# Patient Record
Sex: Female | Born: 1985 | Race: White | Hispanic: No | Marital: Married | Smoking: Never smoker
Health system: Southern US, Community
[De-identification: ages and names within clinical notes are randomized; demographics above are authoritative.]

## PROBLEM LIST (undated history)

## (undated) DIAGNOSIS — T7840XA Allergy, unspecified, initial encounter: Secondary | ICD-10-CM

## (undated) DIAGNOSIS — K625 Hemorrhage of anus and rectum: Secondary | ICD-10-CM

## (undated) DIAGNOSIS — F32A Depression, unspecified: Secondary | ICD-10-CM

## (undated) DIAGNOSIS — K529 Noninfective gastroenteritis and colitis, unspecified: Secondary | ICD-10-CM

## (undated) DIAGNOSIS — J45909 Unspecified asthma, uncomplicated: Secondary | ICD-10-CM

## (undated) DIAGNOSIS — K389 Disease of appendix, unspecified: Secondary | ICD-10-CM

## (undated) DIAGNOSIS — F419 Anxiety disorder, unspecified: Secondary | ICD-10-CM

## (undated) DIAGNOSIS — A0472 Enterocolitis due to Clostridium difficile, not specified as recurrent: Secondary | ICD-10-CM

## (undated) DIAGNOSIS — M545 Low back pain, unspecified: Secondary | ICD-10-CM

## (undated) DIAGNOSIS — M722 Plantar fascial fibromatosis: Secondary | ICD-10-CM

## (undated) HISTORY — DX: Low back pain, unspecified: M54.50

## (undated) HISTORY — DX: Depression, unspecified: F32.A

## (undated) HISTORY — DX: Enterocolitis due to Clostridium difficile, not specified as recurrent: A04.72

## (undated) HISTORY — PX: TUBAL LIGATION: SHX77

## (undated) HISTORY — DX: Disease of appendix, unspecified: K38.9

## (undated) HISTORY — DX: Low back pain: M54.5

## (undated) HISTORY — DX: Anxiety disorder, unspecified: F41.9

## (undated) HISTORY — DX: Noninfective gastroenteritis and colitis, unspecified: K52.9

## (undated) HISTORY — DX: Allergy, unspecified, initial encounter: T78.40XA

## (undated) HISTORY — PX: WISDOM TOOTH EXTRACTION: SHX21

## (undated) HISTORY — DX: Unspecified asthma, uncomplicated: J45.909

## (undated) HISTORY — PX: CHOLECYSTECTOMY: SHX55

## (undated) HISTORY — DX: Hemorrhage of anus and rectum: K62.5

---

## 2010-12-05 ENCOUNTER — Other Ambulatory Visit (HOSPITAL_COMMUNITY)
Admission: RE | Admit: 2010-12-05 | Discharge: 2010-12-05 | Disposition: A | Discharge status: Home / Self Care | Payer: BC Managed Care – PPO | Source: Ambulatory Visit | Attending: Family Medicine | Admitting: Family Medicine

## 2010-12-05 DIAGNOSIS — Z124 Encounter for screening for malignant neoplasm of cervix: Secondary | ICD-10-CM | POA: Insufficient documentation

## 2013-04-21 ENCOUNTER — Ambulatory Visit (INDEPENDENT_AMBULATORY_CARE_PROVIDER_SITE_OTHER): Payer: BC Managed Care – PPO

## 2013-04-21 VITALS — BP 150/76 | HR 82 | Resp 16 | Ht 64.0 in | Wt 230.0 lb

## 2013-04-21 DIAGNOSIS — M79609 Pain in unspecified limb: Secondary | ICD-10-CM

## 2013-04-21 DIAGNOSIS — M79673 Pain in unspecified foot: Secondary | ICD-10-CM

## 2013-04-21 DIAGNOSIS — M722 Plantar fascial fibromatosis: Secondary | ICD-10-CM

## 2013-04-21 NOTE — Progress Notes (Signed)
   Subjective:    Patient ID: Casey Pope, female    DOB: 06/22/1985, 28 y.o.   MRN: [id number]  HPI Comments: "My heel and arch hurt"  Pt c/o of plantar arch and heel pain left, sharp sensations for a few months. Getting worse. AM pain. Been wearing Dr. Margart SicklesScholl's insoles and wearing sneakers. PCP Rx'd etodolac. Using ice, stretching and elevating.  patient been taking it Titralac for her pain from her primary physician having pain left more so than right with the inferior heel and arch area. Patient wearing shoes are extremely hypermobile are flexible    Review of Systems  Cardiovascular:       Calf pain with walking   Musculoskeletal: Positive for back pain and gait problem.  All other systems reviewed and are negative.       Objective:   Physical Exam Partially objective findings as follows vascular status is intact with pedal pulses palpable DP postal for PT +2/4 bilateral Refill time 3 seconds all digits skin temperature warm turgor normal no edema rubor pallor or varicosities noted. Neurologically epicritic and proprioceptive sensations intact and symmetric bilateral is normal plantar response. Dermatologic exam skin color pigment normal hair growth absent. Nails unremarkable. Orthopedic biomechanical exam rectus foot type pain on palpation Magan plantar fascia from mid arch to inferior calcaneal tubercle left more significant than right. X-rays confirm well-developed inferior retrocalcaneal spurring promontory changes there is hypertrophy navicular lateral projection dorsal spurring of the navicular noted. However most significant thickening of fascial structures noted. No other osseous abnormalities no cyst or tumors no history of injury or trauma.       Assessment & Plan:  Assessment this time is plantar fasciitis/heel spur syndrome bilateral left more so than right this time fascial strapping applied to the left foot maintain the [place] anti-inflammatory NSAID therapies also use  crocs around the house no barefoot or flimsy shoes or flip-flops. Recommended ice to the heel every evening as well recheck in 2 weeks for followup reassessment possible followup with functional orthoses as recommended  Alvan Dameichard Eshani Maestre DPM

## 2013-04-21 NOTE — Patient Instructions (Signed)

## 2013-05-09 ENCOUNTER — Ambulatory Visit: Payer: BC Managed Care – PPO

## 2013-08-04 LAB — OB RESULTS CONSOLE RPR: RPR: NONREACTIVE

## 2013-08-04 LAB — OB RESULTS CONSOLE ANTIBODY SCREEN: Antibody Screen: NEGATIVE

## 2013-08-04 LAB — OB RESULTS CONSOLE RUBELLA ANTIBODY, IGM: Rubella: IMMUNE

## 2013-08-04 LAB — OB RESULTS CONSOLE HEPATITIS B SURFACE ANTIGEN: HEP B S AG: NEGATIVE

## 2013-08-04 LAB — OB RESULTS CONSOLE ABO/RH: RH Type: POSITIVE

## 2013-08-04 LAB — OB RESULTS CONSOLE HIV ANTIBODY (ROUTINE TESTING): HIV: NONREACTIVE

## 2013-08-04 LAB — OB RESULTS CONSOLE GC/CHLAMYDIA
CHLAMYDIA, DNA PROBE: NEGATIVE
Gonorrhea: NEGATIVE

## 2013-12-05 ENCOUNTER — Other Ambulatory Visit: Payer: Self-pay

## 2013-12-20 ENCOUNTER — Other Ambulatory Visit (HOSPITAL_COMMUNITY): Payer: Self-pay | Admitting: Obstetrics and Gynecology

## 2013-12-20 DIAGNOSIS — O368390 Maternal care for abnormalities of the fetal heart rate or rhythm, unspecified trimester, not applicable or unspecified: Secondary | ICD-10-CM

## 2013-12-27 ENCOUNTER — Encounter (HOSPITAL_COMMUNITY): Payer: Self-pay

## 2013-12-27 ENCOUNTER — Ambulatory Visit (HOSPITAL_COMMUNITY)
Admission: RE | Admit: 2013-12-27 | Discharge: 2013-12-27 | Disposition: A | Discharge status: Home / Self Care | Payer: BC Managed Care – PPO | Source: Ambulatory Visit | Attending: Obstetrics and Gynecology | Admitting: Obstetrics and Gynecology

## 2013-12-27 VITALS — BP 111/56 | HR 82 | Wt 245.0 lb

## 2013-12-27 DIAGNOSIS — Z363 Encounter for antenatal screening for malformations: Secondary | ICD-10-CM | POA: Insufficient documentation

## 2013-12-27 DIAGNOSIS — Z1389 Encounter for screening for other disorder: Secondary | ICD-10-CM | POA: Insufficient documentation

## 2013-12-27 DIAGNOSIS — O368390 Maternal care for abnormalities of the fetal heart rate or rhythm, unspecified trimester, not applicable or unspecified: Secondary | ICD-10-CM | POA: Diagnosis not present

## 2014-01-29 ENCOUNTER — Encounter (HOSPITAL_COMMUNITY): Payer: Self-pay

## 2014-02-26 ENCOUNTER — Encounter (HOSPITAL_COMMUNITY)
Admission: RE | Admit: 2014-02-26 | Discharge: 2014-02-26 | Disposition: A | Discharge status: Home / Self Care | Payer: BC Managed Care – PPO | Source: Ambulatory Visit | Attending: Obstetrics & Gynecology | Admitting: Obstetrics & Gynecology

## 2014-02-26 ENCOUNTER — Encounter (HOSPITAL_COMMUNITY): Payer: Self-pay

## 2014-02-26 LAB — CBC
HEMATOCRIT: 34.7 % — AB (ref 36.0–46.0)
HEMOGLOBIN: 11.5 g/dL — AB (ref 12.0–15.0)
MCH: 29.6 pg (ref 26.0–34.0)
MCHC: 33.1 g/dL (ref 30.0–36.0)
MCV: 89.4 fL (ref 78.0–100.0)
Platelets: 209 10*3/uL (ref 150–400)
RBC: 3.88 MIL/uL (ref 3.87–5.11)
RDW: 13.4 % (ref 11.5–15.5)
WBC: 9.2 10*3/uL (ref 4.0–10.5)

## 2014-02-26 NOTE — Patient Instructions (Addendum)
   Your procedure is scheduled on: Tuesday, Dec 1  [facility] at: 11:30 AM Pick up the phone at the desk and dial [phone] and inform us of your arrival.  Please call this number if you have any problems the morning of surgery: [phone]  Remember: Do not eat food after midnight: Tonight Do not drink clear liquids after: 9 AM Tuesday, day of surgery Take these medicines the morning of surgery with a SIP OF WATER:  Bring albuterol inhaler with you on day of surgery.  Do not wear jewelry, make-up, or FINGER nail polish No metal in your hair or on your body. Do not wear lotions, powders, perfumes.  You may wear deodorant.  Do not bring valuables to the hospital. Contacts, dentures or bridgework may not be worn into surgery.  Leave suitcase in the car. After Surgery it may be brought to your room. For patients being admitted to the hospital, checkout time is 11:00am the day of discharge.  Home with husband Casey Pope.

## 2014-02-26 NOTE — H&P (Signed)
Casey HolsteinHilary S Pope is a 28 y.o. female presenting for repeat C/S and BTL.  Antepartum course has been complicated by previous C/S x 1.  Additionally, fetal PACs were noted in the office and MFM referral was placed.  No additional abnormalities were found; no structural disease.    Maternal Medical History:  Fetal activity: Perceived fetal activity is normal.   Last perceived fetal movement was within the past hour.    Prenatal complications: no prenatal complications Prenatal Complications - Diabetes: none.    OB History    Gravida Para Term Preterm AB TAB SAB Ectopic Multiple Living   2 1 1  0 0 0 0 0 0 2     Past Medical History  Diagnosis Date  . Lower back pain     ? herniated disc  . Asthma     as a child - rarely uses inhaler   Past Surgical History  Procedure Laterality Date  . Cesarean section    . Cholecystectomy    . Wisdom tooth extraction     Family History: family history is not on file. Social History:  reports that she has never smoked. She has never used smokeless tobacco. She reports that she drinks alcohol. She reports that she does not use illicit drugs.   Prenatal Transfer Tool  Maternal Diabetes: No Genetic Screening: Normal Maternal Ultrasounds/Referrals: Normal Fetal Ultrasounds or other Referrals:  Referred to Materal Fetal Medicine , Other: Referral for fetal PACs Maternal Substance Abuse:  No Significant Maternal Medications:  None Significant Maternal Lab Results:  Lab values include: Group B Strep negative Other Comments:  None  ROS    There were no vitals taken for this visit. Maternal Exam:  Abdomen: Surgical scars: low transverse.   Fundal height is c/w dates.   Estimated fetal weight is 8#.       Physical Exam  Constitutional: She is oriented to person, place, and time. She appears well-developed and well-nourished.  GI: Soft. There is no rebound and no guarding.  Neurological: She is alert and oriented to person, place, and time.   Skin: Skin is warm and dry.  Psychiatric: She has a normal mood and affect. Her behavior is normal.    Prenatal labs: ABO, Rh: --/--/O POS (11/30 1100) Antibody: NEG (11/30 1100) Rubella: Immune (05/08 0000) RPR: Nonreactive (05/08 0000)  HBsAg: Negative (05/08 0000)  HIV: Non-reactive (05/08 0000)  GBS:     Assessment/Plan: 28 yo G2 P1 at 39 weeks with previous C/S and DFPS -Repeat C/S and BTL  Corean Yoshimura 02/26/2014, 9:16 PM

## 2014-02-27 ENCOUNTER — Inpatient Hospital Stay (HOSPITAL_COMMUNITY)
Admission: RE | Admit: 2014-02-27 | Discharge: 2014-03-01 | DRG: 766 | Disposition: A | Discharge status: Home / Self Care | Payer: BC Managed Care – PPO | Source: Ambulatory Visit | Attending: Obstetrics & Gynecology | Admitting: Obstetrics & Gynecology

## 2014-02-27 ENCOUNTER — Encounter (HOSPITAL_COMMUNITY)
Admission: RE | Disposition: A | Discharge status: Home / Self Care | Payer: Self-pay | Source: Ambulatory Visit | Attending: Obstetrics & Gynecology

## 2014-02-27 ENCOUNTER — Inpatient Hospital Stay (HOSPITAL_COMMUNITY): Payer: BC Managed Care – PPO | Admitting: Anesthesiology

## 2014-02-27 ENCOUNTER — Encounter (HOSPITAL_COMMUNITY): Payer: Self-pay | Admitting: Anesthesiology

## 2014-02-27 DIAGNOSIS — O3421 Maternal care for scar from previous cesarean delivery: Principal | ICD-10-CM | POA: Diagnosis present

## 2014-02-27 DIAGNOSIS — Z98891 History of uterine scar from previous surgery: Secondary | ICD-10-CM

## 2014-02-27 DIAGNOSIS — Z3A39 39 weeks gestation of pregnancy: Secondary | ICD-10-CM | POA: Diagnosis present

## 2014-02-27 DIAGNOSIS — Z302 Encounter for sterilization: Secondary | ICD-10-CM

## 2014-02-27 LAB — ABO/RH: ABO/RH(D): O POS

## 2014-02-27 LAB — PREPARE RBC (CROSSMATCH)

## 2014-02-27 SURGERY — Surgical Case
Anesthesia: Spinal | Site: Abdomen | Laterality: Bilateral

## 2014-02-27 MED ORDER — NALBUPHINE HCL 10 MG/ML IJ SOLN: 5.0000 mg | Freq: Once | INTRAMUSCULAR | Status: AC | PRN

## 2014-02-27 MED ORDER — SCOPOLAMINE 1 MG/3DAYS TD PT72: 1.0000 | MEDICATED_PATCH | Freq: Once | TRANSDERMAL | Status: DC

## 2014-02-27 MED ORDER — NALOXONE HCL 1 MG/ML IJ SOLN: 1.0000 ug/kg/h | INTRAVENOUS | Status: DC | PRN

## 2014-02-27 MED ORDER — SCOPOLAMINE 1 MG/3DAYS TD PT72
MEDICATED_PATCH | TRANSDERMAL | Status: AC
  Administered 2014-02-27: 1.5 mg via TRANSDERMAL
  Filled 2014-02-27: qty 1

## 2014-02-27 MED ORDER — DEXAMETHASONE SODIUM PHOSPHATE 10 MG/ML IJ SOLN
INTRAMUSCULAR | Status: AC
  Filled 2014-02-27: qty 1

## 2014-02-27 MED ORDER — SODIUM CHLORIDE 0.9 % IJ SOLN: 3.0000 mL | INTRAMUSCULAR | Status: DC | PRN

## 2014-02-27 MED ORDER — ONDANSETRON HCL 4 MG/2ML IJ SOLN: 4.0000 mg | INTRAMUSCULAR | Status: DC | PRN

## 2014-02-27 MED ORDER — SIMETHICONE 80 MG PO CHEW
80.0000 mg | CHEWABLE_TABLET | ORAL | Status: DC
  Administered 2014-02-28 – 2014-03-01 (×2): 80 mg via ORAL
  Filled 2014-02-27: qty 1

## 2014-02-27 MED ORDER — PHENYLEPHRINE 8 MG IN D5W 100 ML (0.08MG/ML) PREMIX OPTIME
INJECTION | INTRAVENOUS | Status: AC
Start: 2014-02-27 — End: 2014-02-27
  Filled 2014-02-27: qty 100

## 2014-02-27 MED ORDER — OXYTOCIN 10 UNIT/ML IJ SOLN
INTRAMUSCULAR | Status: AC
  Filled 2014-02-27: qty 4

## 2014-02-27 MED ORDER — ZOLPIDEM TARTRATE 5 MG PO TABS: 5.0000 mg | ORAL_TABLET | Freq: Every evening | ORAL | Status: DC | PRN

## 2014-02-27 MED ORDER — SCOPOLAMINE 1 MG/3DAYS TD PT72
1.0000 | MEDICATED_PATCH | Freq: Once | TRANSDERMAL | Status: DC
  Administered 2014-02-27: 1.5 mg via TRANSDERMAL

## 2014-02-27 MED ORDER — NALBUPHINE HCL 10 MG/ML IJ SOLN: 5.0000 mg | INTRAMUSCULAR | Status: DC | PRN

## 2014-02-27 MED ORDER — SENNOSIDES-DOCUSATE SODIUM 8.6-50 MG PO TABS
2.0000 | ORAL_TABLET | ORAL | Status: DC
  Administered 2014-02-28 – 2014-03-01 (×2): 2 via ORAL
  Filled 2014-02-27: qty 2
  Filled 2014-02-27: qty 1

## 2014-02-27 MED ORDER — LACTATED RINGERS IV SOLN
INTRAVENOUS | Status: DC | PRN
  Administered 2014-02-27 (×3): via INTRAVENOUS

## 2014-02-27 MED ORDER — FENTANYL CITRATE 0.05 MG/ML IJ SOLN
INTRAMUSCULAR | Status: AC
  Filled 2014-02-27: qty 2

## 2014-02-27 MED ORDER — DIBUCAINE 1 % RE OINT: 1.0000 "application " | TOPICAL_OINTMENT | RECTAL | Status: DC | PRN

## 2014-02-27 MED ORDER — LACTATED RINGERS IV SOLN
INTRAVENOUS | Status: DC
  Administered 2014-02-27: 12:00:00 via INTRAVENOUS

## 2014-02-27 MED ORDER — IBUPROFEN 600 MG PO TABS
600.0000 mg | ORAL_TABLET | Freq: Four times a day (QID) | ORAL | Status: DC
  Administered 2014-02-27 – 2014-03-01 (×7): 600 mg via ORAL
  Filled 2014-02-27 (×6): qty 1

## 2014-02-27 MED ORDER — CEFAZOLIN SODIUM-DEXTROSE 2-3 GM-% IV SOLR
2.0000 g | INTRAVENOUS | Status: AC
  Administered 2014-02-27: 2 g via INTRAVENOUS

## 2014-02-27 MED ORDER — MORPHINE SULFATE (PF) 0.5 MG/ML IJ SOLN
INTRAMUSCULAR | Status: DC | PRN
  Administered 2014-02-27: .1 mg via INTRATHECAL

## 2014-02-27 MED ORDER — MENTHOL 3 MG MT LOZG: 1.0000 | LOZENGE | OROMUCOSAL | Status: DC | PRN

## 2014-02-27 MED ORDER — SIMETHICONE 80 MG PO CHEW
80.0000 mg | CHEWABLE_TABLET | Freq: Three times a day (TID) | ORAL | Status: DC
  Administered 2014-02-28 – 2014-03-01 (×3): 80 mg via ORAL
  Filled 2014-02-27 (×3): qty 1

## 2014-02-27 MED ORDER — KETOROLAC TROMETHAMINE 30 MG/ML IJ SOLN
INTRAMUSCULAR | Status: AC
  Filled 2014-02-27: qty 1

## 2014-02-27 MED ORDER — OXYCODONE-ACETAMINOPHEN 5-325 MG PO TABS
1.0000 | ORAL_TABLET | ORAL | Status: DC | PRN
  Administered 2014-02-28 – 2014-03-01 (×7): 1 via ORAL
  Filled 2014-02-27 (×7): qty 1

## 2014-02-27 MED ORDER — WITCH HAZEL-GLYCERIN EX PADS: 1.0000 "application " | MEDICATED_PAD | CUTANEOUS | Status: DC | PRN

## 2014-02-27 MED ORDER — OXYTOCIN 40 UNITS IN LACTATED RINGERS INFUSION - SIMPLE MED: 62.5000 mL/h | INTRAVENOUS | Status: AC

## 2014-02-27 MED ORDER — DIPHENHYDRAMINE HCL 25 MG PO CAPS: 25.0000 mg | ORAL_CAPSULE | ORAL | Status: DC | PRN

## 2014-02-27 MED ORDER — TETANUS-DIPHTH-ACELL PERTUSSIS 5-2.5-18.5 LF-MCG/0.5 IM SUSP: 0.5000 mL | Freq: Once | INTRAMUSCULAR | Status: DC

## 2014-02-27 MED ORDER — DEXTROSE IN LACTATED RINGERS 5 % IV SOLN: INTRAVENOUS | Status: DC

## 2014-02-27 MED ORDER — ONDANSETRON HCL 4 MG/2ML IJ SOLN
INTRAMUSCULAR | Status: DC | PRN
  Administered 2014-02-27: 4 mg via INTRAVENOUS

## 2014-02-27 MED ORDER — HYDROMORPHONE HCL 1 MG/ML IJ SOLN: 0.2500 mg | INTRAMUSCULAR | Status: DC | PRN

## 2014-02-27 MED ORDER — PHENYLEPHRINE HCL 10 MG/ML IJ SOLN
INTRAMUSCULAR | Status: DC | PRN
  Administered 2014-02-27: 80 ug via INTRAVENOUS

## 2014-02-27 MED ORDER — ONDANSETRON HCL 4 MG PO TABS: 4.0000 mg | ORAL_TABLET | ORAL | Status: DC | PRN

## 2014-02-27 MED ORDER — METOCLOPRAMIDE HCL 5 MG/ML IJ SOLN
INTRAMUSCULAR | Status: AC
  Filled 2014-02-27: qty 2

## 2014-02-27 MED ORDER — PRENATAL MULTIVITAMIN CH
1.0000 | ORAL_TABLET | Freq: Every day | ORAL | Status: DC
  Administered 2014-02-28 – 2014-03-01 (×2): 1 via ORAL
  Filled 2014-02-27 (×2): qty 1

## 2014-02-27 MED ORDER — ONDANSETRON HCL 4 MG/2ML IJ SOLN
INTRAMUSCULAR | Status: AC
  Filled 2014-02-27: qty 2

## 2014-02-27 MED ORDER — DIPHENHYDRAMINE HCL 25 MG PO CAPS: 25.0000 mg | ORAL_CAPSULE | Freq: Four times a day (QID) | ORAL | Status: DC | PRN

## 2014-02-27 MED ORDER — MORPHINE SULFATE 0.5 MG/ML IJ SOLN
INTRAMUSCULAR | Status: AC
  Filled 2014-02-27: qty 10

## 2014-02-27 MED ORDER — MEPERIDINE HCL 25 MG/ML IJ SOLN: 6.2500 mg | INTRAMUSCULAR | Status: DC | PRN

## 2014-02-27 MED ORDER — OXYTOCIN 10 UNIT/ML IJ SOLN
40.0000 [IU] | INTRAVENOUS | Status: DC | PRN
  Administered 2014-02-27: 40 [IU] via INTRAVENOUS

## 2014-02-27 MED ORDER — CEFAZOLIN SODIUM-DEXTROSE 2-3 GM-% IV SOLR
INTRAVENOUS | Status: AC
  Filled 2014-02-27: qty 50

## 2014-02-27 MED ORDER — KETOROLAC TROMETHAMINE 30 MG/ML IJ SOLN
30.0000 mg | Freq: Four times a day (QID) | INTRAMUSCULAR | Status: DC | PRN
  Administered 2014-02-27: 30 mg via INTRAMUSCULAR

## 2014-02-27 MED ORDER — 0.9 % SODIUM CHLORIDE (POUR BTL) OPTIME
TOPICAL | Status: DC | PRN
  Administered 2014-02-27: 1000 mL

## 2014-02-27 MED ORDER — ONDANSETRON HCL 4 MG/2ML IJ SOLN: 4.0000 mg | Freq: Three times a day (TID) | INTRAMUSCULAR | Status: DC | PRN

## 2014-02-27 MED ORDER — PHENYLEPHRINE 8 MG IN D5W 100 ML (0.08MG/ML) PREMIX OPTIME
INJECTION | INTRAVENOUS | Status: DC | PRN
  Administered 2014-02-27: 80 ug/min via INTRAVENOUS

## 2014-02-27 MED ORDER — OXYCODONE-ACETAMINOPHEN 5-325 MG PO TABS: 2.0000 | ORAL_TABLET | ORAL | Status: DC | PRN

## 2014-02-27 MED ORDER — KETOROLAC TROMETHAMINE 30 MG/ML IJ SOLN: 30.0000 mg | Freq: Four times a day (QID) | INTRAMUSCULAR | Status: DC | PRN

## 2014-02-27 MED ORDER — SIMETHICONE 80 MG PO CHEW: 80.0000 mg | CHEWABLE_TABLET | ORAL | Status: DC | PRN

## 2014-02-27 MED ORDER — LACTATED RINGERS IV SOLN
INTRAVENOUS | Status: DC
  Administered 2014-02-28: 02:00:00 via INTRAVENOUS

## 2014-02-27 MED ORDER — METOCLOPRAMIDE HCL 5 MG/ML IJ SOLN
INTRAMUSCULAR | Status: DC | PRN
  Administered 2014-02-27: 10 mg via INTRAVENOUS

## 2014-02-27 MED ORDER — DIPHENHYDRAMINE HCL 50 MG/ML IJ SOLN: 12.5000 mg | INTRAMUSCULAR | Status: DC | PRN

## 2014-02-27 MED ORDER — LANOLIN HYDROUS EX OINT: 1.0000 "application " | TOPICAL_OINTMENT | CUTANEOUS | Status: DC | PRN

## 2014-02-27 MED ORDER — FENTANYL CITRATE 0.05 MG/ML IJ SOLN
INTRAMUSCULAR | Status: DC | PRN
  Administered 2014-02-27: 25 ug via INTRATHECAL

## 2014-02-27 MED ORDER — NALOXONE HCL 0.4 MG/ML IJ SOLN: 0.4000 mg | INTRAMUSCULAR | Status: DC | PRN

## 2014-02-27 SURGICAL SUPPLY — 33 items
BENZOIN TINCTURE PRP APPL 2/3 (GAUZE/BANDAGES/DRESSINGS) ×3 IMPLANT
CLAMP CORD UMBIL (MISCELLANEOUS) IMPLANT
CLOSURE WOUND 1/2 X4 (GAUZE/BANDAGES/DRESSINGS) ×1
CLOTH BEACON ORANGE TIMEOUT ST (SAFETY) ×3 IMPLANT
DRAPE SHEET LG 3/4 BI-LAMINATE (DRAPES) IMPLANT
DRSG OPSITE POSTOP 4X10 (GAUZE/BANDAGES/DRESSINGS) ×3 IMPLANT
DURAPREP 26ML APPLICATOR (WOUND CARE) ×3 IMPLANT
ELECT REM PT RETURN 9FT ADLT (ELECTROSURGICAL) ×3
ELECTRODE REM PT RTRN 9FT ADLT (ELECTROSURGICAL) ×1 IMPLANT
EXTRACTOR VACUUM KIWI (MISCELLANEOUS) ×3 IMPLANT
EXTRACTOR VACUUM M CUP 4 TUBE (SUCTIONS) IMPLANT
EXTRACTOR VACUUM M CUP 4' TUBE (SUCTIONS)
GLOVE BIO SURGEON STRL SZ 6 (GLOVE) ×3 IMPLANT
GLOVE BIOGEL PI IND STRL 6 (GLOVE) ×2 IMPLANT
GLOVE BIOGEL PI INDICATOR 6 (GLOVE) ×4
GOWN STRL REUS W/TWL LRG LVL3 (GOWN DISPOSABLE) ×6 IMPLANT
KIT ABG SYR 3ML LUER SLIP (SYRINGE) ×3 IMPLANT
LIQUID BAND (GAUZE/BANDAGES/DRESSINGS) IMPLANT
NEEDLE HYPO 25X5/8 SAFETYGLIDE (NEEDLE) ×3 IMPLANT
NS IRRIG 1000ML POUR BTL (IV SOLUTION) ×3 IMPLANT
PACK C SECTION WH (CUSTOM PROCEDURE TRAY) ×3 IMPLANT
PAD OB MATERNITY 4.3X12.25 (PERSONAL CARE ITEMS) ×3 IMPLANT
STAPLER VISISTAT 35W (STAPLE) IMPLANT
STRIP CLOSURE SKIN 1/2X4 (GAUZE/BANDAGES/DRESSINGS) ×2 IMPLANT
SUT CHROMIC 0 CTX 36 (SUTURE) ×9 IMPLANT
SUT MON AB 2-0 CT1 27 (SUTURE) ×3 IMPLANT
SUT PDS AB 0 CT1 27 (SUTURE) IMPLANT
SUT PLAIN 0 NONE (SUTURE) IMPLANT
SUT VIC AB 0 CT1 36 (SUTURE) IMPLANT
SUT VIC AB 4-0 KS 27 (SUTURE) ×3 IMPLANT
TOWEL OR 17X24 6PK STRL BLUE (TOWEL DISPOSABLE) ×3 IMPLANT
TRAY FOLEY CATH 14FR (SET/KITS/TRAYS/PACK) ×3 IMPLANT
WATER STERILE IRR 1000ML POUR (IV SOLUTION) ×3 IMPLANT

## 2014-02-27 NOTE — Plan of Care (Signed)
Problem: Phase I Progression Outcomes Goal: Pain controlled with appropriate interventions Outcome: Completed/Met Date Met:  02/27/14 Goal: Foley catheter patent Outcome: Completed/Met Date Met:  02/27/14

## 2014-02-27 NOTE — Anesthesia Postprocedure Evaluation (Signed)
  Anesthesia Post-op Note  Patient: Casey HolsteinHilary S Pope  Procedure(s) Performed: Procedure(s) with comments: CESAREAN SECTION WITH BILATERAL TUBAL LIGATION (Bilateral) - Repeat  edc 03/06/14  Patient is awake, responsive, moving her legs, and has signs of resolution of her numbness. Pain and nausea are reasonably well controlled. Vital signs are stable and clinically acceptable. Oxygen saturation is clinically acceptable. There are no apparent anesthetic complications at this time. Patient is ready for discharge.

## 2014-02-27 NOTE — Progress Notes (Signed)
Dr Cristela BlueKyle Jackson called.  He stated that pt may be discharge from PACU.

## 2014-02-27 NOTE — Transfer of Care (Signed)
Immediate Anesthesia Transfer of Care Note  Patient: Casey Pope  Procedure(s) Performed: Procedure(s) with comments: CESAREAN SECTION WITH BILATERAL TUBAL LIGATION (Bilateral) - Repeat  edc 03/06/14  Patient Location: PACU  Anesthesia Type:Spinal  Level of Consciousness: awake, alert , oriented and patient cooperative  Airway & Oxygen Therapy: Patient Spontanous Breathing  Post-op Assessment: Report given to PACU RN and Post -op Vital signs reviewed and stable  Post vital signs: Reviewed and stable  Complications: No apparent anesthesia complications

## 2014-02-27 NOTE — Op Note (Signed)
Chester HolsteinHilary S Johnson PROCEDURE DATE: 02/27/2014  PREOPERATIVE DIAGNOSIS: Intrauterine pregnancy at  5872w0d weeks gestation with previous C/S x 1 and desire for permanent sterilization.  POSTOPERATIVE DIAGNOSIS: The same  PROCEDURE:   Repeat Low Transverse Cesarean Section with BTL  SURGEON:  Dr. Mitchel HonourMegan Estie Sproule  INDICATIONS: Chester HolsteinHilary S Johnson is a 28 y.o. [id number] at 5672w0d scheduled for cesarean section secondary to desire for repeat.  The risks of cesarean section discussed with the patient included but were not limited to: bleeding which may require transfusion or reoperation; infection which may require antibiotics; injury to bowel, bladder, ureters or other surrounding organs; injury to the fetus; need for additional procedures including hysterectomy in the event of a life-threatening hemorrhage; placental abnormalities wth subsequent pregnancies, incisional problems, thromboembolic phenomenon and other postoperative/anesthesia complications. Regarding BTL, the patient is counseled about risk of permanence, failure, and ectopic. The patient concurred with the proposed plan, giving informed written consent for the procedure.    FINDINGS:  Viable female infant in cephalic presentation, APGARs 9,9:  Weight pending  Clear amniotic fluid.  Intact placenta, three vessel cord.  Grossly normal uterus, ovaries and fallopian tubes. .   ANESTHESIA:  Spinal ESTIMATED BLOOD LOSS: 550 ml SPECIMENS: Placenta sent to L&D, Bilateral tubal segments to pathology COMPLICATIONS: None immediate  PROCEDURE IN DETAIL:  The patient received intravenous antibiotics and had sequential compression devices applied to her lower extremities while in the preoperative area.  She was then taken to the operating room where spinal anesthesia was administered and was found to be adequate. She was then placed in a dorsal supine position with a leftward tilt, and prepped and draped in a sterile manner.  A foley catheter was placed into her  bladder and attached to constant gravity.  After an adequate timeout was performed, a Pfannenstiel skin incision was made with scalpel and carried through to the underlying layer of fascia. The fascia was incised in the midline and this incision was extended bilaterally using the Mayo scissors. Kocher clamps were applied to the superior aspect of the fascial incision and the underlying rectus muscles were dissected off bluntly. A similar process was carried out on the inferior aspect of the facial incision. The rectus muscles were separated in the midline bluntly and the peritoneum was entered bluntly.  Bladder flap was created sharply and developed bluntly.  Bladder blade was placed.  A transverse hysterotomy was made with a scalpel and extended bilaterally bluntly. The bladder blade was then removed. The infant was successfully delivered using a single pull with Kiwi vaccuum, and cord was clamped and cut and infant was handed over to awaiting neonatology team. Uterine massage was then administered and the placenta delivered intact with three-vessel cord. The uterus was cleared of clot and debris.  The hysterotomy was closed with 0 Chromic in a single layer.  A figure of eight stitch was placed in the middle of the hysterotomy to render it hemostatic.  Attention was turned to the fallopian tubes.  The right tube was elevated, doubly tied with plain gut and knuckle excised with excellent hemostasis.  The left tube was elevated and since mobility was restricted, a window was made in the mesosalpinx, proximal and distal tubal segments were tied with plain gut, and tubal segment was excised.  Hemostasis was noted.  The peritoneum and rectus muscles were noted to be hemostatic and were reapproximated using 2-0 monocryl.  The fascia was closed with 0-PDS in a running fashion with good restoration of anatomy.  The subcutaneus tissue was copiously irrigated and was reapproximated using plain gut suture in a running  fashion.  The skin was closed with 4-0 Vicryl in a subcuticular fashion.  Pt tolerated the procedure will.  All counts were correct x2.  Pt went to the recovery room in stable condition.

## 2014-02-27 NOTE — Anesthesia Preprocedure Evaluation (Signed)
Anesthesia Evaluation  Patient identified by MRN, date of birth, ID band Patient awake    Reviewed: Allergy & Precautions, H&P , Patient's Chart, lab work & pertinent test results  Airway Mallampati: II  TM Distance: >3 FB Neck ROM: full    Dental no notable dental hx.    Pulmonary asthma ,  breath sounds clear to auscultation  Pulmonary exam normal       Cardiovascular Exercise Tolerance: Good Rhythm:regular Rate:Normal     Neuro/Psych    GI/Hepatic   Endo/Other    Renal/GU      Musculoskeletal   Abdominal   Peds  Hematology   Anesthesia Other Findings   Reproductive/Obstetrics                             Anesthesia Physical Anesthesia Plan  ASA: III  Anesthesia Plan: Spinal   Post-op Pain Management:    Induction:   Airway Management Planned:   Additional Equipment:   Intra-op Plan:   Post-operative Plan:   Informed Consent: I have reviewed the patients History and Physical, chart, labs and discussed the procedure including the risks, benefits and alternatives for the proposed anesthesia with the patient or authorized representative who has indicated his/her understanding and acceptance.   Dental Advisory Given  Plan Discussed with: CRNA  Anesthesia Plan Comments: (Lab work confirmed with CRNA in room. Platelets okay. Discussed spinal anesthetic, and patient consents to the procedure:  included risk of possible headache,backache, failed block, allergic reaction, and nerve injury. This patient was asked if she had any questions or concerns before the procedure started. )        Anesthesia Quick Evaluation

## 2014-02-27 NOTE — Progress Notes (Signed)
No change to H&P.  Casey Lenoir, DO 

## 2014-02-27 NOTE — Plan of Care (Signed)
Problem: Phase I Progression Outcomes Goal: IS, TCDB as ordered Outcome: Completed/Met Date Met:  02/27/14 Goal: Initial discharge plan identified Outcome: Completed/Met Date Met:  02/27/14

## 2014-02-27 NOTE — Anesthesia Procedure Notes (Signed)
Spinal Patient location during procedure: OR Preanesthetic Checklist Completed: patient identified, site marked, surgical consent, pre-op evaluation, timeout performed, IV checked, risks and benefits discussed and monitors and equipment checked Spinal Block Patient position: sitting Prep: DuraPrep Patient monitoring: heart rate, cardiac monitor, continuous pulse ox and blood pressure Approach: midline Location: L3-4 Injection technique: single-shot Needle Needle type: Sprotte  Needle gauge: 24 G Needle length: 9 cm Assessment Sensory level: T4 Additional Notes Spinal Dosage in OR  Bupivicaine ml       1.4 PFMS04   mcg        100 Fentanyl mcg            25    

## 2014-02-28 ENCOUNTER — Encounter (HOSPITAL_COMMUNITY): Payer: Self-pay | Admitting: Obstetrics & Gynecology

## 2014-02-28 LAB — BIRTH TISSUE RECOVERY COLLECTION (PLACENTA DONATION)

## 2014-02-28 LAB — CBC
HCT: 32.1 % — ABNORMAL LOW (ref 36.0–46.0)
Hemoglobin: 10.8 g/dL — ABNORMAL LOW (ref 12.0–15.0)
MCH: 30.3 pg (ref 26.0–34.0)
MCHC: 33.6 g/dL (ref 30.0–36.0)
MCV: 90.2 fL (ref 78.0–100.0)
PLATELETS: 159 10*3/uL (ref 150–400)
RBC: 3.56 MIL/uL — ABNORMAL LOW (ref 3.87–5.11)
RDW: 13.5 % (ref 11.5–15.5)
WBC: 9.9 10*3/uL (ref 4.0–10.5)

## 2014-02-28 LAB — RPR

## 2014-02-28 NOTE — Addendum Note (Signed)
Addendum  created 02/28/14 0803 by Suella Groveoderick C Ezrie Bunyan, CRNA   Modules edited: Notes Section   Notes Section:  File: [id number]

## 2014-02-28 NOTE — Lactation Note (Signed)
This note was copied from the chart of Girl Miles CostainHilary Johnson. Called to room to assist mom with latching baby.  Mom had expressed concerns with pain & wanting to supplement during bedside report.  I assisted mom with trying to latch baby to the right side.  Mom explained that the baby has not latched to that side at all.  She is also reporting severe discomfort when latching to the left.  I assisted Mom in applying a nipple shield, and tried latching the baby in cross-cradle, then football position.  Mom expressed feeling severe discomfort in both positions, complaining that the nipple shield was pinching.  After 5-10 mins of attempting to get Mom comfortable on the right side, we changed positions and tried latching the baby to the left breast.  Mom expressed discomfort in the cross-cradle & football positions.  We were able to get the baby to latch to the right breast with a small nipple shield, but Mom still expressed a "pinching, but better".  I left Mom feeding baby on the right side with a small nipple shield.  I was then called back to the room fifteen minutes later, Mom was expressing increased pain & was requesting formula.  I encouraged mom to use hand expression & comfort gels between feedings in the hopes the previous breakdown will improve & Mom will be able to latch. Mom would like to pump at some point, but states it hurts too much to pump at this point.

## 2014-02-28 NOTE — Anesthesia Postprocedure Evaluation (Signed)
  Anesthesia Post-op Note  Patient: Casey Pope  Procedure(s) Performed: Procedure(s) with comments: CESAREAN SECTION WITH BILATERAL TUBAL LIGATION (Bilateral) - Repeat  edc 03/06/14  Patient Location: Mother/Baby  Anesthesia Type:Spinal  Level of Consciousness: awake, alert , oriented and patient cooperative  Airway and Oxygen Therapy: Patient Spontanous Breathing  Post-op Pain: none  Post-op Assessment: Post-op Vital signs reviewed, Patient's Cardiovascular Status Stable, Respiratory Function Stable, Patent Airway, No signs of Nausea or vomiting, Adequate PO intake, Pain level controlled, No headache, No backache, No residual numbness and No residual motor weakness  Post-op Vital Signs: Reviewed and stable  Last Vitals:  Filed Vitals:   02/28/14 0545  BP: 109/59  Pulse: 72  Temp: 36.7 C  Resp: 18    Complications: No apparent anesthesia complications

## 2014-02-28 NOTE — Plan of Care (Signed)
Problem: Phase I Progression Outcomes Goal: Voiding adequately Outcome: Completed/Met Date Met:  02/28/14  Problem: Phase II Progression Outcomes Goal: Pain controlled on oral analgesia Outcome: Completed/Met Date Met:  02/28/14 Goal: Afebrile, VS remain stable Outcome: Completed/Met Date Met:  02/28/14

## 2014-02-28 NOTE — Lactation Note (Signed)
This note was copied from the chart of Casey Pope. Lactation Consultation Note  Initial visit done.  Breastfeeding consultation services and support information given and reviewed.  Mom breastfed her first baby for 2 weeks but then lost her milk supply after hospitalization for pancreatitis.  Mom states right nipple is flatter and she has used a 24 mm nipple shield.  Mom c/o pain during feeding with shield.  She also has a small blood blister on tip of left nipple.  Reviewed importance of obtaining deep latch and holding baby close during feeding.Vladimir Crofts.LC phone number left with parents to call when baby starts to cue.  Patient Name: Casey Pope ZOXWR'UToday's Date: 02/28/2014 Reason for consult: Initial assessment   Maternal Data    Feeding Feeding Type: Breast Fed Length of feed: 15 min  LATCH Score/Interventions Latch: Grasps breast easily, tongue down, lips flanged, rhythmical sucking. Intervention(s): Assist with latch  Audible Swallowing: Spontaneous and intermittent Intervention(s): Skin to skin Intervention(s): Hand expression  Type of Nipple:  (left small blood blister) Intervention(s): Hand pump  Comfort (Breast/Nipple): Soft / non-tender     Hold (Positioning): Assistance needed to correctly position infant at breast and maintain latch.  LATCH Score: 9  Lactation Tools Discussed/Used Tools: Nipple Shields   Consult Status Consult Status: Follow-up Date: 03/01/14 Follow-up type: In-patient    Huston FoleyMOULDEN, Aayra Hornbaker S 02/28/2014, 10:48 AM

## 2014-02-28 NOTE — Plan of Care (Signed)
Problem: Phase II Progression Outcomes Goal: Progress activity as tolerated unless otherwise ordered Outcome: Completed/Met Date Met:  02/28/14

## 2014-02-28 NOTE — Plan of Care (Signed)
Problem: Phase II Progression Outcomes Goal: Incision intact & without signs/symptoms of infection Outcome: Completed/Met Date Met:  02/28/14 Goal: Other Phase II Outcomes/Goals Outcome: Completed/Met Date Met:  02/28/14

## 2014-02-28 NOTE — Progress Notes (Signed)
Subjective: Postpartum Day 1: Cesarean Delivery Patient reports tolerating PO and no problems voiding.    Objective: Vital signs in last 24 hours: Temp:  [97.6 F (36.4 C)-98.2 F (36.8 C)] 98.1 F (36.7 C) (12/02 0545) Pulse Rate:  [61-86] 72 (12/02 0545) Resp:  [10-24] 18 (12/02 0545) BP: (93-120)/(48-73) 109/59 mmHg (12/02 0545) SpO2:  [96 %-100 %] 98 % (12/02 0545) Weight:  [253 lb (114.76 kg)] 253 lb (114.76 kg) (12/01 1930)  Physical Exam:  General: alert and cooperative Lochia: appropriate Uterine Fundus: firm Incision: healing well DVT Evaluation: No evidence of DVT seen on physical exam. Negative Homan's sign. No cords or calf tenderness. Calf/Ankle edema is present.   Recent Labs  02/26/14 1105 02/28/14 0600  HGB 11.5* 10.8*  HCT 34.7* 32.1*    Assessment/Plan: Status post Cesarean section. Doing well postoperatively.  Continue current care.  Victorio Creeden G 02/28/2014, 8:27 AM

## 2014-02-28 NOTE — Lactation Note (Signed)
This note was copied from the chart of Casey Pope. Lactation Consultation Note  Follow up visit made to check on feedings.  Mom has not called LC.  Mom states baby ate 2 hours ago and fed well on the left but still c/o pain with shield on right.  Encouraged mom to call with next feeding so LC can assist with feeding.  Patient Name: Casey Pope ZOXWR'UToday's Date: 02/28/2014     Maternal Data    Feeding    LATCH Score/Interventions                      Lactation Tools Discussed/Used     Consult Status      Huston FoleyMOULDEN, Barbra Miner S 02/28/2014, 3:11 PM

## 2014-02-28 NOTE — Plan of Care (Signed)
Problem: Phase I Progression Outcomes Goal: OOB as tolerated unless otherwise ordered Outcome: Completed/Met Date Met:  02/28/14 Goal: VS, stable, temp < 100.4 degrees F Outcome: Completed/Met Date Met:  02/28/14 Goal: Other Phase I Outcomes/Goals Outcome: Completed/Met Date Met:  02/28/14  Problem: Phase II Progression Outcomes Goal: Rh isoimmunization per orders Outcome: Completed/Met Date Met:  02/28/14 Goal: Tolerating diet Outcome: Completed/Met Date Met:  02/28/14  Problem: Discharge Progression Outcomes Goal: Tolerating diet Outcome: Completed/Met Date Met:  02/28/14 Goal: Remove staples per MD order Outcome: Not Applicable Date Met:  65/68/12 Goal: MMR given as ordered Outcome: Not Applicable Date Met:  75/17/00 Goal: Discharge plan in place and appropriate Outcome: Completed/Met Date Met:  02/28/14

## 2014-03-01 ENCOUNTER — Ambulatory Visit: Payer: Self-pay

## 2014-03-01 NOTE — Lactation Note (Signed)
This note was copied from the chart of Casey Pope. Lactation Consultation Note  Patient Name: Casey Pope ZOXWR'UToday's Date: 03/01/2014   Patient has already been discharged home with baby.  LC spoke with RN, Irving BurtonEmily who reports that mom had only been bottle-feeding since last night.  She was provided with comfort gelpads and a #24 NS and Irving Burtonmily offered pumping options before discharge but mom declined at that time.  Maternal Data    Feeding    LATCH Score/Interventions                      Lactation Tools Discussed/Used  comfort gelpads and NS provided by RN   Consult Status   Mom to call OP LC as needed   Lynda RainwaterBryant, Jaquisha Frech [place] 03/01/2014, 5:31 PM

## 2014-03-01 NOTE — Plan of Care (Signed)
Problem: Discharge Progression Outcomes Goal: Barriers To Progression Addressed/Resolved Outcome: Completed/Met Date Met:  98/33/82 Goal: Complications resolved/controlled Outcome: Completed/Met Date Met:  03/01/14 Goal: Pain controlled with appropriate interventions Outcome: Completed/Met Date Met:  03/01/14

## 2014-03-01 NOTE — Plan of Care (Signed)
Problem: Discharge Progression Outcomes Goal: Other Discharge Outcomes/Goals Outcome: Not Applicable Date Met:  03/01/14     

## 2014-03-01 NOTE — Discharge Summary (Signed)
Obstetric Discharge Summary Reason for Admission: cesarean section Prenatal Procedures: ultrasound Intrapartum Procedures: cesarean: low cervical, transverse Postpartum Procedures: none Complications-Operative and Postpartum: none HEMOGLOBIN  Date Value Ref Range Status  02/28/2014 10.8* 12.0 - 15.0 g/dL Final   HCT  Date Value Ref Range Status  02/28/2014 32.1* 36.0 - 46.0 % Final    Physical Exam:  General: alert and cooperative Lochia: appropriate Uterine Fundus: firm Incision: healing well DVT Evaluation: No evidence of DVT seen on physical exam. Negative Homan's sign. No cords or calf tenderness. No significant calf/ankle edema.  Discharge Diagnoses: Term Pregnancy-delivered  Discharge Information: Date: 03/01/2014 Activity: pelvic rest Diet: routine Medications: PNV, Ibuprofen and Percocet Condition: stable Instructions: refer to practice specific booklet Discharge to: home   Newborn Data: Live born female  Birth Weight: 7 lb 2 oz (3232 g) APGAR: 9, 9  Home with mother.  Scotty Pinder G 03/01/2014, 3:49 PM

## 2014-03-01 NOTE — Progress Notes (Cosign Needed)
Subjective: Postpartum Day 2: Cesarean Delivery Patient reports tolerating PO, + flatus and no problems voiding.  Reports difficult time with breastfeeding with last pregnancy and desires to work with lactation today  Objective: Vital signs in last 24 hours: Temp:  [97.6 F (36.4 C)-98.6 F (37 C)] 98.6 F (37 C) (12/03 0534) Pulse Rate:  [70-84] 84 (12/03 0534) Resp:  [18] 18 (12/03 0534) BP: (105-119)/(61-67) 105/61 mmHg (12/03 0534) SpO2:  [97 %-98 %] 98 % (12/02 1445)  Physical Exam:  General: alert and cooperative Lochia: appropriate Uterine Fundus: firm Incision: healing well DVT Evaluation: No evidence of DVT seen on physical exam. Negative Homan's sign. No cords or calf tenderness. Calf/Ankle edema is present.   Recent Labs  02/26/14 1105 02/28/14 0600  HGB 11.5* 10.8*  HCT 34.7* 32.1*    Assessment/Plan: Status post Cesarean section. Doing well postoperatively.  Continue current care.  [name] 03/01/2014, 8:49 AM

## 2014-03-02 LAB — TYPE AND SCREEN
ABO/RH(D): O POS
Antibody Screen: NEGATIVE
UNIT DIVISION: 0
UNIT DIVISION: 0
Unit division: 0
Unit division: 0

## 2014-05-29 ENCOUNTER — Encounter (HOSPITAL_COMMUNITY): Payer: Self-pay | Admitting: *Deleted

## 2014-05-29 DIAGNOSIS — W25XXXA Contact with sharp glass, initial encounter: Secondary | ICD-10-CM | POA: Insufficient documentation

## 2014-05-29 DIAGNOSIS — Z8739 Personal history of other diseases of the musculoskeletal system and connective tissue: Secondary | ICD-10-CM | POA: Insufficient documentation

## 2014-05-29 DIAGNOSIS — Y929 Unspecified place or not applicable: Secondary | ICD-10-CM | POA: Insufficient documentation

## 2014-05-29 DIAGNOSIS — Z7951 Long term (current) use of inhaled steroids: Secondary | ICD-10-CM | POA: Insufficient documentation

## 2014-05-29 DIAGNOSIS — Z880 Allergy status to penicillin: Secondary | ICD-10-CM | POA: Diagnosis not present

## 2014-05-29 DIAGNOSIS — Z23 Encounter for immunization: Secondary | ICD-10-CM | POA: Diagnosis not present

## 2014-05-29 DIAGNOSIS — Z79899 Other long term (current) drug therapy: Secondary | ICD-10-CM | POA: Insufficient documentation

## 2014-05-29 DIAGNOSIS — J45909 Unspecified asthma, uncomplicated: Secondary | ICD-10-CM | POA: Diagnosis not present

## 2014-05-29 DIAGNOSIS — Y998 Other external cause status: Secondary | ICD-10-CM | POA: Insufficient documentation

## 2014-05-29 DIAGNOSIS — Y939 Activity, unspecified: Secondary | ICD-10-CM | POA: Diagnosis not present

## 2014-05-29 DIAGNOSIS — S91114A Laceration without foreign body of right lesser toe(s) without damage to nail, initial encounter: Secondary | ICD-10-CM | POA: Insufficient documentation

## 2014-05-29 NOTE — ED Notes (Signed)
Patient presents with laceration to the right 4th and 5th toe.  Stated she kicked a glass she had siting on the floor.  Bleeding controlled

## 2014-05-30 ENCOUNTER — Emergency Department (HOSPITAL_COMMUNITY)
Admission: EM | Admit: 2014-05-30 | Discharge: 2014-05-30 | Disposition: A | Discharge status: Home / Self Care | Payer: BC Managed Care – PPO | Attending: Emergency Medicine | Admitting: Emergency Medicine

## 2014-05-30 ENCOUNTER — Emergency Department (HOSPITAL_COMMUNITY): Payer: BC Managed Care – PPO

## 2014-05-30 DIAGNOSIS — S91311A Laceration without foreign body, right foot, initial encounter: Secondary | ICD-10-CM

## 2014-05-30 HISTORY — DX: Plantar fascial fibromatosis: M72.2

## 2014-05-30 MED ORDER — TETANUS-DIPHTH-ACELL PERTUSSIS 5-2.5-18.5 LF-MCG/0.5 IM SUSP
0.5000 mL | Freq: Once | INTRAMUSCULAR | Status: AC
  Administered 2014-05-30: 0.5 mL via INTRAMUSCULAR
  Filled 2014-05-30: qty 0.5

## 2014-05-30 MED ORDER — LIDOCAINE HCL (PF) 1 % IJ SOLN
30.0000 mL | Freq: Once | INTRAMUSCULAR | Status: AC
  Administered 2014-05-30: 30 mL via INTRADERMAL
  Filled 2014-05-30: qty 30

## 2014-05-30 MED ORDER — HYDROMORPHONE HCL 1 MG/ML IJ SOLN
0.5000 mg | Freq: Once | INTRAMUSCULAR | Status: AC
Start: 2014-05-30 — End: 2014-05-30
  Administered 2014-05-30: 0.5 mg via INTRAMUSCULAR
  Filled 2014-05-30: qty 1

## 2014-05-30 MED ORDER — CIPROFLOXACIN HCL 500 MG PO TABS: 500.0000 mg | ORAL_TABLET | Freq: Two times a day (BID) | ORAL | Status: DC

## 2014-05-30 MED ORDER — HYDROCODONE-ACETAMINOPHEN 5-325 MG PO TABS: 2.0000 | ORAL_TABLET | Freq: Four times a day (QID) | ORAL | Status: DC | PRN

## 2014-05-30 NOTE — ED Provider Notes (Signed)
CSN: [id number]     Arrival date & time 05/29/14  2322 History   First MD Initiated Contact with Patient 05/30/14 0028     Chief Complaint  Patient presents with  . Laceration     (Consider location/radiation/quality/duration/timing/severity/associated sxs/prior Treatment) Patient is a 29 y.o. female presenting with skin laceration. The history is provided by the patient and the spouse. No language interpreter was used.  Laceration Ms. Laural Benes presents for right toe pain and bleeding after stepping on glass just prior to arrival.  She was unable to ambulate using the right foot immediately after the incident secondary to pain and bleeding. She rates the pain as an 8/10. She has not had a tetanus in >10 years.   Past Medical History  Diagnosis Date  . Lower back pain     ? herniated disc  . Asthma     as a child - rarely uses inhaler  . Plantar fasciitis    Past Surgical History  Procedure Laterality Date  . Cesarean section    . Cholecystectomy    . Wisdom tooth extraction    . Cesarean section with bilateral tubal ligation Bilateral 02/27/2014    Procedure: CESAREAN SECTION WITH BILATERAL TUBAL LIGATION;  Surgeon: Mitchel Honour, DO;  Location: [facility];  Service: Obstetrics;  Laterality: Bilateral;  Repeat  edc 03/06/14   No family history on file. History  Substance Use Topics  . Smoking status: Never Smoker   . Smokeless tobacco: Never Used  . Alcohol Use: No     Comment: socially but none with pregnancy   OB History    Gravida Para Term Preterm AB TAB SAB Ectopic Multiple Living   0 0 0 0 0 0 2     Review of Systems  Skin: Positive for wound.  Allergic/Immunologic: Negative for immunocompromised state.  Hematological: Does not bruise/bleed easily.  All other systems reviewed and are negative.     Allergies  Sulfa antibiotics; Amoxicillin; and Nickel  Home Medications   Prior to Admission medications   Medication Sig Start Date End Date Taking?  Authorizing Provider  ALBUTEROL IN Inhale 2 puffs into the lungs as needed (for asthma).     Historical Provider, MD  ciprofloxacin (CIPRO) 500 MG tablet Take 1 tablet (500 mg total) by mouth every 12 (twelve) hours. 05/30/14   Kale Dols Patel-Mills, PA-C  Fluticasone-Salmeterol (ADVAIR DISKUS IN) Inhale 1 puff into the lungs as needed (for asthma).     Historical Provider, MD  HYDROcodone-acetaminophen (NORCO/VICODIN) 5-325 MG per tablet Take 2 tablets by mouth every 6 (six) hours as needed. 05/30/14   Aubrynn Katona Patel-Mills, PA-C  Prenatal Vit-Fe Fumarate-FA (MULTIVITAMIN-PRENATAL) 27-0.8 MG TABS tablet Take 1 tablet by mouth daily at 12 noon.    Historical Provider, MD   BP 105/52 mmHg  Pulse 74  Temp(Src) 98.3 F (36.8 C) (Oral)  Resp 18  Ht  (1.626 m)  Wt 239 lb (108.41 kg)  BMI 41.00 kg/m2  SpO2 97%  LMP 05/10/2014  Breastfeeding? No Physical Exam  Constitutional: She is oriented to person, place, and time. She appears well-developed and well-nourished.  Cardiovascular: Normal rate, regular rhythm and normal heart sounds.   Pulmonary/Chest: Effort normal and breath sounds normal.  Musculoskeletal:  Right forth toe laceration with clot surrounding toe. Good pulses. She is able to move plantarflex and dorsiflex all toes. She has normal sensation and good cap refill.   2cm skin abrasion at the bottom of the foot near the  proximal phalynx of the 3rd toe.  Neurological: She is alert and oriented to person, place, and time.  Skin: Skin is warm and dry.  Nursing note and vitals reviewed.   ED Course  LACERATION REPAIR Date/Time: 05/30/2014 6:55 PM Performed by: Catha GosselinPATEL-MILLS, Nevin Grizzle Authorized by: Catha GosselinPATEL-MILLS, Reed Dady Consent: Verbal consent obtained. Risks and benefits: risks, benefits and alternatives were discussed Consent given by: patient Patient understanding: patient states understanding of the procedure being performed Patient consent: the patient's understanding of the procedure  matches consent given Imaging studies: imaging studies available Patient identity confirmed: verbally with patient Body area: lower extremity Location details: right fourth toe Laceration length: 2 cm Foreign bodies: no foreign bodies Tendon involvement: none Nerve involvement: none Vascular damage: no Anesthesia: digital block Local anesthetic: lidocaine 1% without epinephrine Anesthetic total: 3 ml Patient sedated: no Preparation: Patient was prepped and draped in the usual sterile fashion. Irrigation solution: saline (tap water and hydrogen peroxide) Amount of cleaning: extensive Debridement: none Degree of undermining: none Skin closure: 4-0 Prolene Number of sutures: 7 Technique: simple Approximation: close Approximation difficulty: complex Dressing: antibiotic ointment Patient tolerance: Patient tolerated the procedure well with no immediate complications   (including critical care time) Labs Review Labs Reviewed - No data to display  Imaging Review Dg Foot 2 Views Right  05/30/2014   CLINICAL DATA:  Stepped on glass, with lacerations at the base of the right forefoot. Initial encounter.  EXAM: RIGHT FOOT - 2 VIEW  COMPARISON:  None.  FINDINGS: There is no evidence of osseous disruption. Visualized osseous structures are unremarkable in appearance. Visualized joint spaces are preserved. A plantar calcaneal spur is noted. The subtalar joint is grossly unremarkable in appearance.  The known soft tissue lacerations are not well characterized on radiograph. No radiopaque foreign bodies are seen.  IMPRESSION: No evidence of osseous disruption. No radiopaque foreign bodies seen.   Electronically Signed   By: Roanna RaiderJeffery  Chang M.D.   On: 05/30/2014 02:10     EKG Interpretation None      MDM   Final diagnoses:  Foot laceration, right, initial encounter   Right foot xray shows no fracture or foreign body.  Patient placed in a hard-soled shoe.  Patient was put on antibiotics  due to the location of the laceration.  Explained that the sutures should be removed in 7-10 days and to keep the wound clean and dry.      Catha GosselinHanna Patel-Mills, PA-C 05/30/14 1912  Purvis SheffieldForrest Harrison, MD 05/31/14 1024

## 2014-05-30 NOTE — Discharge Instructions (Signed)

## 2014-08-02 ENCOUNTER — Encounter: Payer: Self-pay | Admitting: Primary Care

## 2014-08-02 ENCOUNTER — Encounter (INDEPENDENT_AMBULATORY_CARE_PROVIDER_SITE_OTHER): Payer: Self-pay

## 2014-08-02 ENCOUNTER — Ambulatory Visit (INDEPENDENT_AMBULATORY_CARE_PROVIDER_SITE_OTHER): Payer: BC Managed Care – PPO | Admitting: Primary Care

## 2014-08-02 VITALS — BP 106/68 | HR 82 | Temp 98.3°F | Ht 64.0 in | Wt 245.8 lb

## 2014-08-02 DIAGNOSIS — M545 Low back pain, unspecified: Secondary | ICD-10-CM | POA: Insufficient documentation

## 2014-08-02 DIAGNOSIS — Z6841 Body Mass Index (BMI) 40.0 and over, adult: Secondary | ICD-10-CM

## 2014-08-02 DIAGNOSIS — F411 Generalized anxiety disorder: Secondary | ICD-10-CM | POA: Diagnosis not present

## 2014-08-02 DIAGNOSIS — R51 Headache: Secondary | ICD-10-CM

## 2014-08-02 DIAGNOSIS — R519 Headache, unspecified: Secondary | ICD-10-CM | POA: Insufficient documentation

## 2014-08-02 MED ORDER — ESCITALOPRAM OXALATE 10 MG PO TABS: ORAL_TABLET | ORAL | Status: DC

## 2014-08-02 NOTE — Assessment & Plan Note (Signed)
1-2 weekly with photophobia and phonophobia. Manages with [employer] Financial[employer]ac. May alternate with tylenol. Instructed not to take ibuprofen with etodolac.

## 2014-08-02 NOTE — Progress Notes (Signed)
Pre visit review using our clinic review tool, if applicable. No additional management support is needed unless otherwise documented below in the visit note. 

## 2014-08-02 NOTE — Progress Notes (Signed)
Subjective:    Patient ID: Casey Pope, female    DOB: 01/29/1986, 29 y.o.   MRN: [id number]  HPI  Casey Pope is a 29 year old female who presents today to establish care and discuss the problems mentioned below. Will obtain old records.  1) Asthma: Allergy related and experienced more often when living in an older apartment building over a year ago. She was placed on Advair but has not used since moving out over one year ago. She is currently taking daily Zytrec and has an albuterol inhaler PRN. Has not needed to use her albuterol in months.  2) Frequent UTI's: Will get them once every so often, especially when drinking sodas or tea. She is currently taking cranberry pills when she start to develop symptoms, which help to reduce discomfort and prevent infection.  3) Low back pain: Present for years. She is taking Etodolac daily which helps reduce her pain. She was told that she may have a herniated disc. 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 Pain is managed well at this time. No worsening symptoms.  4) Anxiety: Present since the end of January when returning to work after maternity leave. She experiences: constant worry, difficulty controlling her worry, irritability, difficulty sleeping without medications, fatigue, difficulty concentrating, and is forgetfull.  She will occasionally get panic attacks. She is not breast feeding. She was once placed in Zoloft in 2014 for one month but removed herself due to feeling better. She was provided with a script for Xanax from her GYN which she's only used twice. PHQ-9 score of 0  5) Obesity: She drinks water mainly throughout the day and reports an overall healthy diet consisting of: Breakfast: fruit, packaged breakfast bar. Snack: Fruit. Lunch: left overs from dinner. Dinner: Hamburgers, hot dogs, pizza. She is not exercising.   6) Frequent headaches: Present 1-2 times weekly. Will experience photophobia  and phonophobia. Currently taking etodolac (also used for back pain). Overall she reports improvement, but is going through a lot of stress with her sister's wedding and wrapping up the school year.  Review of Systems  Constitutional: Negative for unexpected weight change.  HENT: Negative for rhinorrhea.   Respiratory: Negative for cough and shortness of breath.   Cardiovascular: Negative for chest pain.  Gastrointestinal: Negative for diarrhea and constipation.  Genitourinary: Negative for dysuria.  Musculoskeletal: Positive for back pain.  Skin: Negative for rash.  Allergic/Immunologic: Positive for environmental allergies.  Neurological: Negative for dizziness and headaches.  Psychiatric/Behavioral:       See HPI. Denies depression.       Past Medical History  Diagnosis Date  . Lower back pain     ? herniated disc  . Asthma     as a child - rarely uses inhaler  . Plantar fasciitis     History   Social History  . Marital Status: Married    Spouse Name: N/A  . Number of Children: N/A  . Years of Education: N/A   Occupational History  . Not on file.   Social History Main Topics  . Smoking status: Never Smoker   . Smokeless tobacco: Never Used  . Alcohol Use: No     Comment: socially but none with pregnancy  . Drug Use: No  . Sexual Activity: Yes    Birth Control/ Protection: None   Other Topics Concern  . Not on file   Social History Narrative   Married.   Has  3 three children.   Completed Masters degree, works as a Runner, broadcasting/film/videoteacher.   Enjoys spending time with her family.       Past Surgical History  Procedure Laterality Date  . Cesarean section    . Cholecystectomy    . Wisdom tooth extraction    . Cesarean section with bilateral tubal ligation Bilateral 02/27/2014    Procedure: CESAREAN SECTION WITH BILATERAL TUBAL LIGATION;  Surgeon: Mitchel HonourMegan Morris, DO;  Location: [facility];  Service: Obstetrics;  Laterality: Bilateral;  Repeat  edc 03/06/14    Family History    Problem Relation Age of Onset  . Hypertension Mother   . Diabetes Mother   . Arthritis Father   . Cancer Maternal Grandfather     lung    Allergies  Allergen Reactions  . Sulfa Antibiotics Hives  . Amoxicillin Rash  . Nickel Rash    Current Outpatient Prescriptions on File Prior to Visit  Medication Sig Dispense Refill  . ALBUTEROL IN Inhale 2 puffs into the lungs as needed (for asthma).     . Fluticasone-Salmeterol (ADVAIR DISKUS IN) Inhale 1 puff into the lungs as needed (for asthma).      No current facility-administered medications on file prior to visit.    BP 106/68 mmHg  Pulse 82  Temp(Src) 98.3 F (36.8 C) (Oral)  Ht 5\' 4"  (1.626 m)  Wt 245 lb 12.8 oz (111.494 kg)  BMI 42.17 kg/m2  SpO2 98%  LMP 07/18/2014    Objective:   Physical Exam  Constitutional: She is oriented to person, place, and time. She appears well-developed.  HENT:  Right Ear: Tympanic membrane and ear canal normal.  Left Ear: Tympanic membrane and ear canal normal.  Nose: Nose normal.  Mouth/Throat: Oropharynx is clear and moist.  Neck: Neck supple.  Cardiovascular: Normal rate and regular rhythm.   Pulmonary/Chest: Effort normal and breath sounds normal.  Abdominal: Soft. Bowel sounds are normal.  Lymphadenopathy:    She has no cervical adenopathy.  Neurological: She is alert and oriented to person, place, and time.  Skin: Skin is warm and dry.  Psychiatric: She has a normal mood and affect.          Assessment & Plan:

## 2014-08-02 NOTE — Assessment & Plan Note (Signed)
Fair diet, but not great. Education provided to limit carbohydrates in the form of breads, pastas, rice, cereal, sweets. She is not exercising. Educated to exercise 30-45 min daily for 5 days a week.

## 2014-08-02 NOTE — Assessment & Plan Note (Signed)
Managed on Zoloft in past for one month, took herself off due to feeling better. Currently has RX for Xanax PRN from GYN. She is currently not breastfeeding Started Lexapro 10 mg tablets today. 1/2 tab for 6 days, then advance to 1 full tab therafter. I've explained to her that drugs of the SSRI class can have side effects such as weight gain, sexual dysfunction, insomnia, headache, nausea. These medications are generally effective at alleviating symptoms of anxiety and/or depression. Also explained the side effect of SI. She is to go to the emergency department if she experiences these symptoms. Follow up in 4 weeks for re-eval.

## 2014-08-02 NOTE — Assessment & Plan Note (Signed)
Present for years. Managed with Etodolac.

## 2014-08-02 NOTE — Patient Instructions (Addendum)
You may alternate ibuprofen and tylenol for headaches. Start Lexapro today for anxiety. Take 1/2 tablet by mouth daily for 6 days, then take 1 tablet by mouth daily thereafter. Follow up in 4 weeks for re-evaluation of anxiety and headaches. You may take your Xanax as needed, but please use sparingly. It was a pleasure to meet you today! Please don't hesitate to call me with any questions. Welcome to [facility]!

## 2014-08-24 ENCOUNTER — Telehealth: Payer: Self-pay

## 2014-08-24 NOTE — Telephone Encounter (Signed)
Pt left v/m; pt established care 08/02/14 and started low dose of lexapro for anxiety, pt does not like way med makes pt feel; pt feels mentally numb, does not want to get out of bed and has no sexual desire. Pt has f/u appt on 08/31/14 with Mayra ReelKate Clark NP but pt wants to stop med prior to appt. Does pt need to wein off med or what to do.pt request cb.

## 2014-08-24 NOTE — Telephone Encounter (Signed)
Spoke with patient regarding symptoms and instructed to take 1/2 tablet daily. We will follow up next week at her scheduled appointment. She verbalized understanding.

## 2014-08-31 ENCOUNTER — Ambulatory Visit (INDEPENDENT_AMBULATORY_CARE_PROVIDER_SITE_OTHER): Payer: BC Managed Care – PPO | Admitting: Primary Care

## 2014-08-31 ENCOUNTER — Encounter: Payer: Self-pay | Admitting: Primary Care

## 2014-08-31 VITALS — BP 106/64 | HR 76 | Temp 97.9°F | Ht 64.0 in | Wt 242.1 lb

## 2014-08-31 DIAGNOSIS — R51 Headache: Secondary | ICD-10-CM

## 2014-08-31 DIAGNOSIS — R519 Headache, unspecified: Secondary | ICD-10-CM

## 2014-08-31 DIAGNOSIS — F411 Generalized anxiety disorder: Secondary | ICD-10-CM | POA: Diagnosis not present

## 2014-08-31 MED ORDER — ESCITALOPRAM OXALATE 5 MG PO TABS: 5.0000 mg | ORAL_TABLET | Freq: Every day | ORAL | Status: DC

## 2014-08-31 MED ORDER — TOPIRAMATE 25 MG PO TABS: 25.0000 mg | ORAL_TABLET | Freq: Every day | ORAL | Status: DC

## 2014-08-31 NOTE — Assessment & Plan Note (Signed)
Improved. Decreased dose to 5 mg as she tolerates this better. Denies SI/HI, GI upset. Does have headaches which have been present for years. Follow up in 6 weeks. Will continue Lexapro 5mg 

## 2014-08-31 NOTE — Progress Notes (Signed)
Pre visit review using our clinic review tool, if applicable. No additional management support is needed unless otherwise documented below in the visit note. 

## 2014-08-31 NOTE — Patient Instructions (Signed)
I've sent the Lexapro 5 mg tablets to your pharmacy. Take 1 tablet by mouth a dinner time as discussed. Start Topamax daily for headache prevention. Take 1 tablet by mouth at bedtime. Follow up in 6 weeks for evaluation of headaches and anxiety. Call me if you have any questions!  It was nice seeing you.

## 2014-08-31 NOTE — Assessment & Plan Note (Signed)
Continues to experience several moderate headaches weekly. Start Topamax 25 mg daily for prevention of headaches. Tylenol PRN. Follow up in 6 weeks for re-evaluation.

## 2014-08-31 NOTE — Progress Notes (Signed)
Subjective:    Patient ID: Casey Pope, female    DOB: 12/27/1985, 29 y.o.   MRN: [id number]  HPI  Casey Pope is a 29 year old female who presents today for follow up of anxiety and headaches.  1) Anxiety: She was evaluated as a new patient on 5/9 with reports of anxiety. She was once treated with Zoloft for anxiety in 2014 but stopped due to feeling better. She was placed on Lexapro 10 mg during last visit ( initiated on 5 mg daily for 6 days before progressing to 10 mg). After 2 weeks on the 10 mg strength she noticed increased daytime drowsiness and "numbness" feeling to her mood. Her dose was reduced back down to 5 mg. Since the reduction in dose she's feeling much better and has noticed a difference in her anxiety, mood, and has less drowsiness. She denies SI/HI, GI upset, nausea.  2) Frequent headaches: Present for several years with photophobia, phonophobia, and nausea. She will typically take etodolac or tylenol for pain which has worked in the past. Gradually over the past several years her headaches are more frequent and more intense and the etodolac and tylenol have not helped to reduce her headaches. She has not been on preventative medication in the past.    Review of Systems  Gastrointestinal: Negative for nausea and abdominal pain.  Neurological: Positive for headaches. Negative for dizziness.  Psychiatric/Behavioral: Negative for suicidal ideas and sleep disturbance.       Past Medical History  Diagnosis Date  . Lower back pain     ? herniated disc  . Asthma     as a child - rarely uses inhaler  . Plantar fasciitis     History   Social History  . Marital Status: Married    Spouse Name: N/A  . Number of Children: N/A  . Years of Education: N/A   Occupational History  . Not on file.   Social History Main Topics  . Smoking status: Never Smoker   . Smokeless tobacco: Never Used  . Alcohol Use: 0.0 oz/week    0 Standard drinks or  equivalent per week     Comment: socially but none with pregnancy  . Drug Use: No  . Sexual Activity: Yes    Birth Control/ Protection: None   Other Topics Concern  . Not on file   Social History Narrative   Married.   Has 3 three children.   Completed Masters degree, works as a Runner, broadcasting/film/videoteacher.   Enjoys spending time with her family.       Past Surgical History  Procedure Laterality Date  . Cesarean section    . Cholecystectomy    . Wisdom tooth extraction    . Cesarean section with bilateral tubal ligation Bilateral 02/27/2014    Procedure: CESAREAN SECTION WITH BILATERAL TUBAL LIGATION;  Surgeon: Mitchel HonourMegan Morris, DO;  Location: [facility];  Service: Obstetrics;  Laterality: Bilateral;  Repeat  edc 03/06/14    Family History  Problem Relation Age of Onset  . Hypertension Mother   . Diabetes Mother   . Arthritis Father   . Cancer Maternal Grandfather     lung    Allergies  Allergen Reactions  . Sulfa Antibiotics Hives  . Amoxicillin Rash  . Nickel Rash    Current Outpatient Prescriptions on File Prior to Visit  Medication Sig Dispense Refill  . ALBUTEROL IN Inhale 2 puffs into the lungs as needed (for asthma).     .Marland Kitchen  etodolac (LODINE) 400 MG tablet Take 400 mg by mouth 2 (two) times daily.    . Fluticasone-Salmeterol (ADVAIR DISKUS IN) Inhale 1 puff into the lungs as needed (for asthma).     . norelgestromin-ethinyl estradiol (ORTHO EVRA) 150-35 MCG/24HR transdermal patch Place 1 patch onto the skin once a week.     No current facility-administered medications on file prior to visit.    BP 106/64 mmHg  Pulse 76  Temp(Src) 97.9 F (36.6 C) (Oral)  Ht  (1.626 m)  Wt 242 lb 1.9 oz (109.825 kg)  BMI 41.54 kg/m2  SpO2 97%  LMP 06/17/2014    Objective:   Physical Exam  Constitutional: She is oriented to person, place, and time.  Eyes: EOM are normal.  Cardiovascular: Normal rate and regular rhythm.   Pulmonary/Chest: Effort normal and breath sounds normal.    Neurological: She is alert and oriented to person, place, and time. No cranial nerve deficit.  Skin: Skin is warm and dry.  Psychiatric: She has a normal mood and affect.          Assessment & Plan:

## 2014-09-26 ENCOUNTER — Telehealth: Payer: Self-pay

## 2014-09-26 DIAGNOSIS — R51 Headache: Principal | ICD-10-CM

## 2014-09-26 DIAGNOSIS — R519 Headache, unspecified: Secondary | ICD-10-CM

## 2014-09-26 MED ORDER — TOPIRAMATE 50 MG PO TABS: 50.0000 mg | ORAL_TABLET | Freq: Every day | ORAL | Status: DC

## 2014-09-26 NOTE — Telephone Encounter (Signed)
Spoke with patient regarding symptoms. Increased Topamax dose, new dose sent to pharmacy.

## 2014-09-26 NOTE — Telephone Encounter (Signed)
Pt left v/m; pt has f/u appt on 10/18/14 but pt continues with significant h/as; topamax has not been effective; pt wants to know if should increase dosage of topamax or change med. Pt request cb.

## 2014-10-12 ENCOUNTER — Ambulatory Visit: Payer: BC Managed Care – PPO | Admitting: Primary Care

## 2014-10-18 ENCOUNTER — Encounter: Payer: Self-pay | Admitting: Primary Care

## 2014-10-18 ENCOUNTER — Ambulatory Visit (INDEPENDENT_AMBULATORY_CARE_PROVIDER_SITE_OTHER): Payer: BC Managed Care – PPO | Admitting: Primary Care

## 2014-10-18 VITALS — BP 106/62 | HR 78 | Temp 97.3°F | Ht 64.0 in | Wt 235.8 lb

## 2014-10-18 DIAGNOSIS — F411 Generalized anxiety disorder: Secondary | ICD-10-CM

## 2014-10-18 DIAGNOSIS — R51 Headache: Secondary | ICD-10-CM | POA: Diagnosis not present

## 2014-10-18 DIAGNOSIS — R3989 Other symptoms and signs involving the genitourinary system: Secondary | ICD-10-CM

## 2014-10-18 DIAGNOSIS — R109 Unspecified abdominal pain: Secondary | ICD-10-CM

## 2014-10-18 DIAGNOSIS — R39198 Other difficulties with micturition: Secondary | ICD-10-CM

## 2014-10-18 DIAGNOSIS — R519 Headache, unspecified: Secondary | ICD-10-CM

## 2014-10-18 LAB — POCT URINALYSIS DIPSTICK
BILIRUBIN UA: NEGATIVE
GLUCOSE UA: NEGATIVE
Ketones, UA: NEGATIVE
Nitrite, UA: NEGATIVE
RBC UA: NEGATIVE
Spec Grav, UA: >=1.03
UROBILINOGEN UA: NEGATIVE
pH, UA: 6

## 2014-10-18 MED ORDER — TOPIRAMATE 100 MG PO TABS: 100.0000 mg | ORAL_TABLET | Freq: Every day | ORAL | Status: DC

## 2014-10-18 MED ORDER — TAMSULOSIN HCL 0.4 MG PO CAPS: 0.4000 mg | ORAL_CAPSULE | Freq: Every day | ORAL | Status: DC

## 2014-10-18 MED ORDER — CIPROFLOXACIN HCL 500 MG PO TABS: 500.0000 mg | ORAL_TABLET | Freq: Two times a day (BID) | ORAL | Status: DC

## 2014-10-18 NOTE — Progress Notes (Signed)
Subjective:    Patient ID: Casey Pope, female    DOB: 23-May-1985, 29 y.o.   MRN: [id number]  HPI  [name] is a 29 year old female who presents today for follow up of headaches and anxiety.   1) Anxiety: Currently managed on Lexapro 5 mg tablets which was reduced from 10 mg which made her feel drowsy and a "numbness" to her mood. Since her last visit she's feeling well overall.  2) Frequent headaches: Present for several years with photophobia, phonophobia, and nausea. She was initiated on Topamax 25 mg and then increased to  last visit for headache prevention. Since last visit she's noticed a slight improvement in her daily headaches but will continue to have them once every other day. Her tubes are tied and is therefore not planning on pregnancy.  3) Flank pain: Her symptoms began with a migraine 2 days ago. She then experienced diarrhea and one episode of vomiting on Tuesday, and then right flank pain yesterday. Her pain is worse when laying down and is also having difficulty urinating. Denies dysuria, hematuria, fevers, chills. She's slowing increasing her diet and is drinking water.  Review of Systems  Constitutional: Negative for fever and chills.  Gastrointestinal: Positive for nausea and diarrhea.  Genitourinary: Positive for flank pain and difficulty urinating. Negative for urgency and frequency.  Neurological: Positive for headaches.       Past Medical History  Diagnosis Date  . Lower back pain     ? herniated disc  . Asthma     as a child - rarely uses inhaler  . Plantar fasciitis     History   Social History  . Marital Status: Married    Spouse Name: N/A  . Number of Children: N/A  . Years of Education: N/A   Occupational History  . Not on file.   Social History Main Topics  . Smoking status: Never Smoker   . Smokeless tobacco: Never Used  . Alcohol Use: 0.0 oz/week    0 Standard drinks or equivalent per week     Comment:  socially but none with pregnancy  . Drug Use: No  . Sexual Activity: Yes    Birth Control/ Protection: None   Other Topics Concern  . Not on file   Social History Narrative   Married.   Has 3 three children.   Completed Masters degree, works as a Runner, broadcasting/film/video.   Enjoys spending time with her family.       Past Surgical History  Procedure Laterality Date  . Cesarean section    . Cholecystectomy    . Wisdom tooth extraction    . Cesarean section with bilateral tubal ligation Bilateral 02/27/2014    Procedure: CESAREAN SECTION WITH BILATERAL TUBAL LIGATION;  Surgeon: Mitchel Honour, DO;  Location: [facility];  Service: Obstetrics;  Laterality: Bilateral;  Repeat  edc 03/06/14    Family History  Problem Relation Age of Onset  . Hypertension Mother   . Diabetes Mother   . Arthritis Father   . Cancer Maternal Grandfather     lung    Allergies  Allergen Reactions  . Sulfa Antibiotics Hives  . Amoxicillin Rash  . Nickel Rash    Current Outpatient Prescriptions on File Prior to Visit  Medication Sig Dispense Refill  . escitalopram (LEXAPRO) 5 MG tablet Take 1 tablet (5 mg total) by mouth daily. 30 tablet 5  . Fluticasone-Salmeterol (ADVAIR DISKUS IN) Inhale 1 puff into the lungs as  needed (for asthma).     . norelgestromin-ethinyl estradiol (ORTHO EVRA) 150-35 MCG/24HR transdermal patch Place 1 patch onto the skin once a week.    . ALBUTEROL IN Inhale 2 puffs into the lungs as needed (for asthma).     . etodolac (LODINE) 400 MG tablet Take 400 mg by mouth 2 (two) times daily.     No current facility-administered medications on file prior to visit.    BP 106/62 mmHg  Pulse 78  Temp(Src) 97.3 F (36.3 C) (Oral)  Ht 5\' 4"  (1.626 m)  Wt 235 lb 12.8 oz (106.958 kg)  BMI 40.45 kg/m2  SpO2 99%  LMP 09/23/2014    Objective:   Physical Exam  Constitutional: She appears well-nourished. She does not appear ill.  Cardiovascular: Normal rate and regular rhythm.   Pulmonary/Chest:  Effort normal and breath sounds normal.  Abdominal: Soft. There is no tenderness. There is no CVA tenderness.  Skin: Skin is warm and dry.  Psychiatric: She has a normal mood and affect.          Assessment & Plan:  Flank pain: UA: Positive for leuks and 1+ protein. No nitrites or blood. No CVA tenderness. No radiation of pain to groin. Possible renal stones, given UA and exam will treat for UTI. RX for Cipro and Flomax to help with urine flow. Follow up PRN.

## 2014-10-18 NOTE — Assessment & Plan Note (Signed)
Improved on increased dose of 50 mg but continues to have headaches every other day. Will increase to 100 mg once daily. Follow up in 3 months.

## 2014-10-18 NOTE — Progress Notes (Signed)
Pre visit review using our clinic review tool, if applicable. No additional management support is needed unless otherwise documented below in the visit note. 

## 2014-10-18 NOTE — Patient Instructions (Addendum)
Start Topamax 100 mg tablets. Take 1 tablet by mouth at bedtime for headaches.  Your urine test shows slight infection. Start Cipro antibiotics. Take 1 tablet by mouth twice daily for 7 days.  You may also take the Flomax (tamulosin) once daily to help with urination.  Call me if your back pain becomes worse and/or you develop fevers, chills, or fatigue.  Follow up in 3 months for headaches and anxiety. Call me sooner if the Topamax dose is not effective.  It was nice to see you!

## 2014-10-18 NOTE — Assessment & Plan Note (Signed)
Stable on Lexapro 5 mg. Feels well managed. Will continue to monitor.

## 2014-10-21 LAB — URINE CULTURE: Colony Count: 70000

## 2014-11-29 ENCOUNTER — Telehealth: Payer: Self-pay

## 2014-11-29 DIAGNOSIS — Z20828 Contact with and (suspected) exposure to other viral communicable diseases: Secondary | ICD-10-CM

## 2014-11-29 MED ORDER — OSELTAMIVIR PHOSPHATE 75 MG PO CAPS: 75.0000 mg | ORAL_CAPSULE | Freq: Every day | ORAL | Status: DC

## 2014-11-29 NOTE — Telephone Encounter (Signed)
I sent Tamiflu capsules to her pharmacy. Take 1 capsule by mouth daily for 7 days for flu prevention.

## 2014-11-29 NOTE — Telephone Encounter (Signed)
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 Now pt is not having any symptoms. Pt request cb. Pt last seen 10/18/14.[facility].

## 2014-11-30 NOTE — Telephone Encounter (Signed)
Called and notified patient of Kate's comments. Patient verbalized understanding.  

## 2015-01-18 ENCOUNTER — Telehealth: Payer: Self-pay | Admitting: *Deleted

## 2015-01-18 NOTE — Telephone Encounter (Signed)
We will need to discuss in an office visit as I have several questions for her. She can cut her tablets in half and take 50 mg in the morning and 50 mg at bedtime. This may help. Thanks.

## 2015-01-18 NOTE — Telephone Encounter (Signed)
Patient is coming in for f/u of headaches 10/25.  She states she was advised to call back before then if headaches worsened.  This is the case.  She questions if there is anything she can do differently, or should she wait and discuss at 10/25 ov.  Please advise.

## 2015-01-18 NOTE — Telephone Encounter (Signed)
Patient notified.  Will try as advised and follow up at 10/25 ov

## 2015-01-22 ENCOUNTER — Encounter: Payer: Self-pay | Admitting: Primary Care

## 2015-01-22 ENCOUNTER — Ambulatory Visit (INDEPENDENT_AMBULATORY_CARE_PROVIDER_SITE_OTHER): Payer: BC Managed Care – PPO | Admitting: Primary Care

## 2015-01-22 VITALS — BP 106/72 | HR 61 | Temp 97.7°F | Ht 64.0 in | Wt 229.0 lb

## 2015-01-22 DIAGNOSIS — Z6841 Body Mass Index (BMI) 40.0 and over, adult: Secondary | ICD-10-CM | POA: Diagnosis not present

## 2015-01-22 DIAGNOSIS — R51 Headache: Secondary | ICD-10-CM

## 2015-01-22 DIAGNOSIS — R519 Headache, unspecified: Secondary | ICD-10-CM

## 2015-01-22 DIAGNOSIS — G43901 Migraine, unspecified, not intractable, with status migrainosus: Secondary | ICD-10-CM | POA: Diagnosis not present

## 2015-01-22 MED ORDER — SUMATRIPTAN SUCCINATE 50 MG PO TABS: ORAL_TABLET | ORAL | Status: DC

## 2015-01-22 NOTE — Assessment & Plan Note (Addendum)
Improvement with increase in Topamax to 100, but continues to experience dull headaches twice weekly. Exam unremarkable. She likes the improvements with Topamax, especially the weight loss and does not want to switch meds. Advised she take 50 mg at 7pm and 25-50 mg upon waking in the morning. Migraine headache occurred last week.  RX sent for Imitrex to help with breakthrough migraines. No other recent migraine. If no improvement with divided dose of topamax, will consider switching to amitriptyline or send to neuro for further evaluation.

## 2015-01-22 NOTE — Patient Instructions (Addendum)
Start taking Topamax 50 mg at 7pm and 25-50 mg when you wake up.  You may take Imitrex (Sumatriptan) tablets as needed for severe migraine. Take 1 tablet by mouth at onset of migraine. May repeat in 2 hours if no resolve. Do not exceed 2 tablets in 24 hours.  Complete lab work prior to leaving today. I will notify you of your results.  Start exercising. You should be getting 1 hour of moderate intensity exercise 5 days weekly.  It is important that you improve your diet. Please limit carbohydrates in the form of white bread, rice, pasta, cakes, cookies, sugary drinks, etc. Increase your consumption of fresh fruits and vegetables, and lean protein. Limit fast food and fried foods.  You need to consume about 2 liters of water daily.   Follow up in 3 months for re-evaluation.  It was a pleasure to see you today!

## 2015-01-22 NOTE — Assessment & Plan Note (Signed)
Voiced frustration with lack of weight loss over the years and would like to try prescription meds. Discussed that I would like to see 3 months of hard work to lose weight as she is not exercising and her diet has room for improvement. Will consider in 3 months.

## 2015-01-22 NOTE — Progress Notes (Signed)
Subjective:    Patient ID: Casey Pope, female    DOB: 1985/04/18, 29 y.o.   MRN: [id number]  HPI  [name] is a 29 year old female who presents today for follow up of frequent headaches. She is currently managed on Topamax 100 mg tablets daily for migraine prevention and frequent headaches. She was evaluated 3 months ago for which her dose of Topamax was increased from 50 mg to 100 mg as she felt improvement but continued to experience headaches every other day.  Since her last visit she's had improvement overall. She continues to experience dull headaches twice weekly for which she's been taking tylenol and ibuprofen. She called on 10/21 with reports of increase in headaches over the previous several days. She was advised to take 50 mg of Topamax in the morning and 50 mg in the evening. Her headaches became worse about 2-3 weeks ago as she's been dealing with increased stress at home. This past Thursday she experienced a severe headache to where she could not function at work. She had photophobia, nausea, and pain behind her eyes. She had no issues with medication increase since July until just over the past 2-3 weeks. She has not taken the Topamax in 50 mg increments as discussed.   Review of Systems  Respiratory: Negative for shortness of breath.   Cardiovascular: Negative for chest pain.  Gastrointestinal: Negative for diarrhea.  Neurological: Positive for headaches. Negative for dizziness.       Past Medical History  Diagnosis Date  . Lower back pain     ? herniated disc  . Asthma     as a child - rarely uses inhaler  . Plantar fasciitis     Social History   Social History  . Marital Status: Married    Spouse Name: N/A  . Number of Children: N/A  . Years of Education: N/A   Occupational History  . Not on file.   Social History Main Topics  . Smoking status: Never Smoker   . Smokeless tobacco: Never Used  . Alcohol Use: 0.0 oz/week    0  Standard drinks or equivalent per week     Comment: socially but none with pregnancy  . Drug Use: No  . Sexual Activity: Yes    Birth Control/ Protection: None   Other Topics Concern  . Not on file   Social History Narrative   Married.   Has 3 three children.   Completed Masters degree, works as a Runner, broadcasting/film/video.   Enjoys spending time with her family.       Past Surgical History  Procedure Laterality Date  . Cesarean section    . Cholecystectomy    . Wisdom tooth extraction    . Cesarean section with bilateral tubal ligation Bilateral 02/27/2014    Procedure: CESAREAN SECTION WITH BILATERAL TUBAL LIGATION;  Surgeon: Mitchel Honour, DO;  Location: [facility];  Service: Obstetrics;  Laterality: Bilateral;  Repeat  edc 03/06/14    Family History  Problem Relation Age of Onset  . Hypertension Mother   . Diabetes Mother   . Arthritis Father   . Cancer Maternal Grandfather     lung    Allergies  Allergen Reactions  . Sulfa Antibiotics Hives  . Amoxicillin Rash  . Nickel Rash    Current Outpatient Prescriptions on File Prior to Visit  Medication Sig Dispense Refill  . escitalopram (LEXAPRO) 5 MG tablet Take 1 tablet (5 mg total) by mouth daily. 30  tablet 5  . etodolac (LODINE) 400 MG tablet Take 400 mg by mouth 2 (two) times daily.    . Fluticasone-Salmeterol (ADVAIR DISKUS IN) Inhale 1 puff into the lungs as needed (for asthma).     . norelgestromin-ethinyl estradiol (ORTHO EVRA) 150-35 MCG/24HR transdermal patch Place 1 patch onto the skin once a week.    . topiramate (TOPAMAX) 100 MG tablet Take 1 tablet (100 mg total) by mouth daily. 30 tablet 3  . ALBUTEROL IN Inhale 2 puffs into the lungs as needed (for asthma).     . tamsulosin (FLOMAX) 0.4 MG CAPS capsule Take 1 capsule (0.4 mg total) by mouth daily. (Patient not taking: Reported on 01/22/2015) 14 capsule 0   No current facility-administered medications on file prior to visit.    BP 106/72 mmHg  Pulse 61  Temp(Src) 97.7  F (36.5 C) (Oral)  Ht 5\' 4"  (1.626 m)  Wt 229 lb (103.874 kg)  BMI 39.29 kg/m2  SpO2 99%  LMP 12/20/2014    Objective:   Physical Exam  Constitutional: She appears well-nourished.  Eyes: Conjunctivae and EOM are normal. Pupils are equal, round, and reactive to light.  Cardiovascular: Normal rate and regular rhythm.   Pulmonary/Chest: Effort normal and breath sounds normal.  Neurological: She is alert. No cranial nerve deficit.  Skin: Skin is warm and dry.  Psychiatric: She has a normal mood and affect.          Assessment & Plan:

## 2015-01-22 NOTE — Progress Notes (Signed)
Pre visit review using our clinic review tool, if applicable. No additional management support is needed unless otherwise documented below in the visit note. 

## 2015-01-23 LAB — COMPREHENSIVE METABOLIC PANEL
ALBUMIN: 3.9 g/dL (ref 3.5–5.2)
ALK PHOS: 81 U/L (ref 39–117)
ALT: 12 U/L (ref 0–35)
AST: 11 U/L (ref 0–37)
BUN: 8 mg/dL (ref 6–23)
CO2: 23 mEq/L (ref 19–32)
Calcium: 8.9 mg/dL (ref 8.4–10.5)
Chloride: 107 mEq/L (ref 96–112)
Creatinine, Ser: 0.69 mg/dL (ref 0.40–1.20)
GFR: 106.31 mL/min (ref >60.00)
Glucose, Bld: 82 mg/dL (ref 70–99)
POTASSIUM: 3.8 meq/L (ref 3.5–5.1)
SODIUM: 138 meq/L (ref 135–145)
TOTAL PROTEIN: 7.3 g/dL (ref 6.0–8.3)
Total Bilirubin: 0.3 mg/dL (ref 0.2–1.2)

## 2015-01-29 ENCOUNTER — Other Ambulatory Visit: Payer: Self-pay | Admitting: Primary Care

## 2015-01-29 DIAGNOSIS — R519 Headache, unspecified: Secondary | ICD-10-CM

## 2015-01-29 DIAGNOSIS — R51 Headache: Principal | ICD-10-CM

## 2015-01-29 MED ORDER — TOPIRAMATE 50 MG PO TABS: 50.0000 mg | ORAL_TABLET | Freq: Two times a day (BID) | ORAL | Status: DC

## 2015-01-29 NOTE — Telephone Encounter (Signed)
[facility] to [facility] days supply for  topiramate (TOPAMAX) 100 MG tablet 30 tablet 3 10/18/2014    Take 1 tablet (100 mg total) by mouth daily.   Dispense:   30 tablets      Refills:  3  Last prescribed on 10/18/2014. Last seen on 01/22/2015. Next follow up on 04/25/2015.

## 2015-03-06 ENCOUNTER — Telehealth: Payer: Self-pay | Admitting: Primary Care

## 2015-03-06 NOTE — Telephone Encounter (Signed)
TELEPHONE ADVICE RECORD [facility]  Patient Name: Miles CostainHILARY JOHNSON  DOB: 11/21/1985    Initial Comment Caller states I have a bad headache and may have sinus infection   Nurse Assessment  Nurse: Odis LusterBowers, RN, Bjorn Loserhonda Date/Time Lamount Cohen(Eastern Time): 03/06/2015 4:39:26 PM  Confirm and document reason for call. If symptomatic, describe symptoms. ---Caller states I have a bad headache and may have sinus infection. Reports that she has a sinus headache. Reports cold symptoms 2-3 days ago, denies fever.  Has the patient traveled out of the country within the last 30 days? ---No  Does the patient have any new or worsening symptoms? ---Yes  Will a triage be completed? ---Yes  Related visit to physician within the last 2 weeks? ---No  Does the PT have any chronic conditions? (i.e. diabetes, asthma, etc.) ---Yes  List chronic conditions. ---migraines  Did the patient indicate they were pregnant? ---No  Is this a behavioral health or substance abuse call? ---No     Guidelines    Guideline Title Affirmed Question Affirmed Notes  Sinus Pain or Congestion [1] Sinus congestion as part of a cold AND [2] present < 10 days (all triage questions negative)    Final Disposition User   Home Care Odis LusterBowers, RN, Bjorn Loserhonda    Disagree/Comply: Danella Maiersomply

## 2015-03-18 ENCOUNTER — Other Ambulatory Visit: Payer: Self-pay | Admitting: Primary Care

## 2015-03-18 NOTE — Telephone Encounter (Signed)
Electronically refill request for   escitalopram (LEXAPRO) 5 MG tablet   Take 1 tablet (5 mg total) by mouth daily.  Dispense: 30 tablet   Refills: 5     Last prescribed on 08/31/2014. Last seen on 01/22/2015. Next follow up on 04/25/2015.

## 2015-04-25 ENCOUNTER — Ambulatory Visit: Payer: BC Managed Care – PPO | Admitting: Primary Care

## 2015-04-30 ENCOUNTER — Encounter: Payer: Self-pay | Admitting: Family Medicine

## 2015-04-30 ENCOUNTER — Ambulatory Visit (INDEPENDENT_AMBULATORY_CARE_PROVIDER_SITE_OTHER): Payer: BC Managed Care – PPO | Admitting: Family Medicine

## 2015-04-30 ENCOUNTER — Encounter: Payer: Self-pay | Admitting: *Deleted

## 2015-04-30 ENCOUNTER — Other Ambulatory Visit: Payer: Self-pay | Admitting: Primary Care

## 2015-04-30 VITALS — BP 110/78 | HR 78 | Temp 98.4°F | Ht 64.0 in | Wt 221.5 lb

## 2015-04-30 DIAGNOSIS — R109 Unspecified abdominal pain: Secondary | ICD-10-CM

## 2015-04-30 DIAGNOSIS — N10 Acute tubulo-interstitial nephritis: Secondary | ICD-10-CM

## 2015-04-30 DIAGNOSIS — K30 Functional dyspepsia: Secondary | ICD-10-CM

## 2015-04-30 LAB — POC URINALSYSI DIPSTICK (AUTOMATED)
Bilirubin, UA: NEGATIVE
Blood, UA: POSITIVE
Glucose, UA: NEGATIVE
Ketones, UA: NEGATIVE
Nitrite, UA: NEGATIVE
Protein, UA: NEGATIVE
Spec Grav, UA: >=1.03
Urobilinogen, UA: 0.2
pH, UA: 5

## 2015-04-30 MED ORDER — CIPROFLOXACIN HCL 500 MG PO TABS: 500.0000 mg | ORAL_TABLET | Freq: Two times a day (BID) | ORAL | Status: DC

## 2015-04-30 NOTE — Progress Notes (Signed)
Pre visit review using our clinic review tool, if applicable. No additional management support is needed unless otherwise documented below in the visit note. 

## 2015-04-30 NOTE — Progress Notes (Signed)
   Subjective:    Patient ID: Casey Pope, female    DOB: 09-07-1985, 30 y.o.   MRN: [id number]  Urinary Frequency  This is a new problem. The current episode started in the past 7 days (3 days). The problem has been gradually worsening. Quality: no pain, just abd pressure. The pain is at a severity of 0/10. The patient is experiencing no pain. The maximum temperature recorded prior to her arrival was 101 - 101.9 F. The fever has been present for less than 1 day. She is sexually active. Associated symptoms include frequency, hesitancy, nausea and urgency. Pertinent negatives include no chills, flank pain, hematuria, possible pregnancy or vomiting. Associated symptoms comments: Right lower abd soreness and right low back pain. She has tried increased fluids for the symptoms. The treatment provided mild relief. Her past medical history is significant for recurrent UTIs. There is no history of catheterization, kidney stones, a single kidney, urinary stasis or a urological procedure.   Dizzy, no appetite.  Social History /Family History/Past Medical History reviewed and updated if needed. Hx pf frequent UTI. 1-2 months ago had UTI, treated with cipro. Resolved completely.  Review of Systems  Constitutional: Negative for chills.  Gastrointestinal: Positive for nausea. Negative for vomiting.  Genitourinary: Positive for hesitancy, urgency and frequency. Negative for hematuria and flank pain.       Objective:   Physical Exam  Constitutional: Vital signs are normal. She appears well-developed and well-nourished. She is cooperative.  Non-toxic appearance. She does not appear ill. No distress.  HENT:  Head: Normocephalic.  Right Ear: Hearing, tympanic membrane, external ear and ear canal normal. Tympanic membrane is not erythematous, not retracted and not bulging.  Left Ear: Hearing, tympanic membrane, external ear and ear canal normal. Tympanic membrane is not erythematous, not  retracted and not bulging.  Nose: No mucosal edema or rhinorrhea. Right sinus exhibits no maxillary sinus tenderness and no frontal sinus tenderness. Left sinus exhibits no maxillary sinus tenderness and no frontal sinus tenderness.  Mouth/Throat: Uvula is midline, oropharynx is clear and moist and mucous membranes are normal.  Eyes: Conjunctivae, EOM and lids are normal. Pupils are equal, round, and reactive to light. Lids are everted and swept, no foreign bodies found.  Neck: Trachea normal and normal range of motion. Neck supple. Carotid bruit is not present. No thyroid mass and no thyromegaly present.  Cardiovascular: Normal rate, regular rhythm, S1 normal, S2 normal, normal heart sounds, intact distal pulses and normal pulses.  Exam reveals no gallop and no friction rub.   No murmur heard. Pulmonary/Chest: Effort normal and breath sounds normal. No tachypnea. No respiratory distress. She has no decreased breath sounds. She has no wheezes. She has no rhonchi. She has no rales.  Abdominal: Soft. Normal appearance and bowel sounds are normal. There is no hepatosplenomegaly. There is tenderness in the right lower quadrant and suprapubic area. There is no CVA tenderness.   Right lower back pain  Neurological: She is alert.  Skin: Skin is warm, dry and intact. No rash noted.  Psychiatric: Her speech is normal and behavior is normal. Judgment and thought content normal. Her mood appears not anxious. Cognition and memory are normal. She does not exhibit a depressed mood.          Assessment & Plan:

## 2015-04-30 NOTE — Assessment & Plan Note (Signed)
Will cover for pyelo given fever and systemic symptoms.  Treat with cipro x 7 days, high dose.  Send for culture.  Push fluids.

## 2015-04-30 NOTE — Telephone Encounter (Signed)
Electronically refill request for   tamsulosin (FLOMAX) 0.4 MG CAPS capsule   Take 1 capsule (0.4 mg total) by mouth daily.  Dispense: 14 capsule   Refills: 0     Last prescribed on 10/18/2014. Patient did no show on 04/25/2015 and was seen for acute today with Dr Ermalene Searing. No future appointment.

## 2015-04-30 NOTE — Addendum Note (Signed)
Addended by: Liane Comber C on: 04/30/2015 11:00 AM   Modules accepted: Orders

## 2015-04-30 NOTE — Patient Instructions (Signed)
Complete course of antibiotics.  Keep up with water.  Call if not tolerating antibiotic or fever on antibiotics.  Go to ER if cannot keep down fluids or antibiotics for IV medicaton.

## 2015-05-01 ENCOUNTER — Telehealth: Payer: Self-pay

## 2015-05-01 DIAGNOSIS — N390 Urinary tract infection, site not specified: Secondary | ICD-10-CM

## 2015-05-01 DIAGNOSIS — R319 Hematuria, unspecified: Principal | ICD-10-CM

## 2015-05-01 MED ORDER — NITROFURANTOIN MONOHYD MACRO 100 MG PO CAPS: 100.0000 mg | ORAL_CAPSULE | Freq: Two times a day (BID) | ORAL | Status: DC

## 2015-05-01 NOTE — Telephone Encounter (Signed)
Pt left v/m; pt was seen 04/30/15 and started on Cipro for kidney infection; today pt is still having trouble urinating and the whites of pts eyes are blood shot and have a yellow tint; pt wants to know if this is side effect to Cipro or should pt be concerned. Pt request question to go to Mayra Reel NP and Pt request cb.

## 2015-05-01 NOTE — Telephone Encounter (Signed)
Culture is in process. Allergy to amoxicillin and sulfa based drugs. Reaction to cipro now.  Please have her stop Ciprofloxacin. I've sent in a prescription for macrobid, another antibiotic, that may help with symptoms. She will take 1 tablet by mouth twice daily for 7 days. Please have her return if no improvement in symptoms in 3 days, or if her fevers reach 102.  Does she have any nausea or vomiting? Is she able to increase water consumption?

## 2015-05-01 NOTE — Telephone Encounter (Signed)
Called and notified patient of Kate's comments. Patient stated that she have been nausea before and still after taking cipro. She is drinking plenty of fliuds but having trouble for not having the urge to go. However, she was to go twice yesterday and this morning. Will call if no improvement.

## 2015-05-02 LAB — URINE CULTURE
COLONY COUNT: NO GROWTH
Organism ID, Bacteria: NO GROWTH

## 2015-06-13 ENCOUNTER — Other Ambulatory Visit: Payer: Self-pay | Admitting: Primary Care

## 2015-06-13 DIAGNOSIS — R519 Headache, unspecified: Secondary | ICD-10-CM

## 2015-06-13 DIAGNOSIS — R51 Headache: Secondary | ICD-10-CM

## 2015-06-13 DIAGNOSIS — F411 Generalized anxiety disorder: Secondary | ICD-10-CM

## 2015-06-13 NOTE — Telephone Encounter (Signed)
Electronically refill request for   topiramate (TOPAMAX) 50 MG tablet   Take 1 tablet (50 mg total) by mouth 2 (two) times daily.  Dispense: 180 tablet   Refills: 0     Last prescribed on 01/29/2015.     escitalopram (LEXAPRO) 5 MG tablet   Take 1 tablet (5 mg total) by mouth daily.  Dispense: 30 tablet   Refills: 5     Last prescribed on  08/31/2014.      Last seen on 04/30/2015. No future appointment.

## 2015-06-27 ENCOUNTER — Telehealth: Payer: Self-pay | Admitting: Internal Medicine

## 2015-06-27 NOTE — Telephone Encounter (Signed)
Patient Name: Casey Pope  Gender: Female  DOB: 08/23/1985   Age: [age] Y 2 M 29 D  Return Phone Number: [phone] (Primary)  Address:   [place] [id number]   Client [facility]  [facility]  Physician Vernona Riegerlark, Katherine   Contact Type Call  Who Is Calling Patient / Member / Family / Caregiver  Call Type Triage / Clinical  Relationship To Patient Self  Return Phone Number [phone] (Primary)  Chief Complaint Headache  Reason for Call Symptomatic / Request for Health Information  Initial Comment Caller states she is having some cold symptoms. She was exposed to the flu this past Thursday. Her nose is running, congestion, tired and a sinus headache.   PreDisposition Call Doctor  Translation No   Nurse Assessment  Nurse: Nicanor Bakeosta, RN, Marylene LandAngela Date/Time Lamount Cohen(Eastern Time): 06/27/2015 3:43:10 PM  Confirm and document reason for call. If symptomatic, describe symptoms. You must click the next button to save text entered. ---Caller was exposed to the flu last Thursday but has had cold symptoms all week. She seemed to get better but today it got suddenly worse. No fever. She now has been sneezing, nasal congestion and headache as well as being tired. The headache is in the forhead and toward the temples.  Has the patient traveled out of the country within the last 30 days? ---No  Does the patient have any new or worsening symptoms? ---Yes  Will a triage be completed? ---Yes  Related visit to physician within the last 2 weeks? ---No  Does the PT have any chronic conditions? (i.e. diabetes, asthma, etc.) ---Yes  List chronic conditions. ---asthma but has not used her inhaler in over 2 yrs  Is the patient pregnant or possibly pregnant? (Ask all females between the ages of 8612-55) ---No  Is this a behavioral health or substance abuse call? ---No     Guidelines      Guideline Title Affirmed  Question Affirmed Notes Nurse Date/Time (Eastern Time)  Sinus Pain or Congestion [1] Redness or swelling on the cheek, forehead or around the eye AND [2] no fever  [place], RN, Marylene Landngela 06/27/2015 3:45:26 PM   Disp. Time Lamount Cohen(Eastern Time) Disposition Final User          06/27/2015 3:55:14 PM See Physician within 4 Hours (or PCP triage) Yes Nicanor Bakeosta, RN, Rosalyn ChartersAngela        Caller Understands: Yes  Disagree/Comply: Comply     Care Advice Given Per Guideline      SEE PHYSICIAN WITHIN 4 HOURS (or PCP triage): PAIN MEDICINES: * For pain relief, take acetaminophen, ibuprofen, or naproxen. * Use the lowest amount that makes your pain feel better. CALL BACK IF: * You become worse. CARE ADVICE given per Sinus Pain or Congestion (Adult) guideline.

## 2015-09-10 ENCOUNTER — Other Ambulatory Visit: Payer: Self-pay | Admitting: Primary Care

## 2015-09-10 DIAGNOSIS — R51 Headache: Secondary | ICD-10-CM

## 2015-09-10 DIAGNOSIS — R519 Headache, unspecified: Secondary | ICD-10-CM

## 2015-09-10 DIAGNOSIS — F411 Generalized anxiety disorder: Secondary | ICD-10-CM

## 2015-09-10 NOTE — Telephone Encounter (Signed)
Electronically refill request for topiramate (TOPAMAX) 50 MG tablet and escitalopram (LEXAPRO) 5 MG tablet.  Last prescribed on 06/14/2015. Last seen on 04/30/2015. No future appt.

## 2015-09-27 ENCOUNTER — Encounter: Payer: BC Managed Care – PPO | Admitting: Primary Care

## 2015-10-17 ENCOUNTER — Encounter: Payer: BC Managed Care – PPO | Admitting: Primary Care

## 2015-11-29 ENCOUNTER — Encounter: Payer: Self-pay | Admitting: Family Medicine

## 2015-11-29 ENCOUNTER — Ambulatory Visit (INDEPENDENT_AMBULATORY_CARE_PROVIDER_SITE_OTHER): Payer: BC Managed Care – PPO | Admitting: Family Medicine

## 2015-11-29 DIAGNOSIS — J0100 Acute maxillary sinusitis, unspecified: Secondary | ICD-10-CM | POA: Diagnosis not present

## 2015-11-29 MED ORDER — DOXYCYCLINE HYCLATE 100 MG PO TABS: 100.0000 mg | ORAL_TABLET | Freq: Two times a day (BID) | ORAL | 0 refills | Status: DC

## 2015-11-29 MED ORDER — FLUTICASONE PROPIONATE 50 MCG/ACT NA SUSP: 2.0000 | Freq: Every day | NASAL | 0 refills | Status: DC

## 2015-11-29 NOTE — Progress Notes (Signed)
Sx started a few days ago.  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   Mult sick contacts. Tooth pain, ST, facial pain.  No fevers.  B ear pain.  Some cough.  No vomiting, no diarrhea.   Tried pseudophed and mucinex.    Meds, vitals, and allergies reviewed.   ROS: Per HPI unless specifically indicated in ROS section   GEN: nad, alert and oriented HEENT: mucous membranes moist, tm w/o erythema, nasal exam w/o erythema, clear discharge noted,  OP with cobblestoning, max sinuses ttp NECK: supple w/o LA CV: rrr.   PULM: ctab, no inc wob EXT: no edema SKIN: no acute rash

## 2015-11-29 NOTE — Patient Instructions (Signed)
Start doxy and flonase.  Sun caution on doxy.  Rest and fluids.  Take care.  Glad to see you.

## 2015-11-29 NOTE — Progress Notes (Signed)
Pre visit review using our clinic review tool, if applicable. No additional management support is needed unless otherwise documented below in the visit note. 

## 2015-12-02 ENCOUNTER — Encounter: Payer: Self-pay | Admitting: Family Medicine

## 2015-12-02 DIAGNOSIS — J019 Acute sinusitis, unspecified: Secondary | ICD-10-CM | POA: Insufficient documentation

## 2015-12-02 NOTE — Assessment & Plan Note (Signed)
Start doxy and flonase.  Sun caution on doxy.  Rest and fluids.  Nontoxic, update us as needed.  She agrees.

## 2015-12-31 ENCOUNTER — Telehealth: Payer: Self-pay | Admitting: Primary Care

## 2015-12-31 ENCOUNTER — Encounter: Payer: Self-pay | Admitting: Primary Care

## 2015-12-31 ENCOUNTER — Ambulatory Visit: Payer: BC Managed Care – PPO | Admitting: Primary Care

## 2015-12-31 ENCOUNTER — Encounter: Payer: Self-pay | Admitting: Internal Medicine

## 2015-12-31 ENCOUNTER — Ambulatory Visit (INDEPENDENT_AMBULATORY_CARE_PROVIDER_SITE_OTHER): Payer: BC Managed Care – PPO | Admitting: Internal Medicine

## 2015-12-31 VITALS — BP 100/70 | HR 74 | Temp 98.3°F | Wt 236.0 lb

## 2015-12-31 DIAGNOSIS — F411 Generalized anxiety disorder: Secondary | ICD-10-CM

## 2015-12-31 MED ORDER — BUSPIRONE HCL 5 MG PO TABS: 5.0000 mg | ORAL_TABLET | Freq: Every day | ORAL | 2 refills | Status: DC | PRN

## 2015-12-31 NOTE — Patient Instructions (Signed)
Generalized Anxiety Disorder Generalized anxiety disorder (GAD) is a mental disorder. It interferes with life functions, including relationships, work, and school. GAD is different from normal anxiety, which everyone experiences at some point in their lives in response to specific life events and activities. Normal anxiety actually helps us prepare for and get through these life events and activities. Normal anxiety goes away after the event or activity is over.  GAD causes anxiety that is not necessarily related to specific events or activities. It also causes excess anxiety in proportion to specific events or activities. The anxiety associated with GAD is also difficult to control. GAD can vary from mild to severe. People with severe GAD can have intense waves of anxiety with physical symptoms (panic attacks).  SYMPTOMS The anxiety and worry associated with GAD are difficult to control. This anxiety and worry are related to many life events and activities and also occur more days than not for 6 months or longer. People with GAD also have three or more of the following symptoms (one or more in children):  Restlessness.   Fatigue.  Difficulty concentrating.   Irritability.  Muscle tension.  Difficulty sleeping or unsatisfying sleep. DIAGNOSIS GAD is diagnosed through an assessment by your health care provider. Your health care provider will ask you questions aboutyour mood,physical symptoms, and events in your life. Your health care provider may ask you about your medical history and use of alcohol or drugs, including prescription medicines. Your health care provider may also do a physical exam and blood tests. Certain medical conditions and the use of certain substances can cause symptoms similar to those associated with GAD. Your health care provider may refer you to a mental health specialist for further evaluation. TREATMENT The following therapies are usually used to treat GAD:    Medication. Antidepressant medication usually is prescribed for long-term daily control. Antianxiety medicines may be added in severe cases, especially when panic attacks occur.   Talk therapy (psychotherapy). Certain types of talk therapy can be helpful in treating GAD by providing support, education, and guidance. A form of talk therapy called cognitive behavioral therapy can teach you healthy ways to think about and react to daily life events and activities.  Stress managementtechniques. These include yoga, meditation, and exercise and can be very helpful when they are practiced regularly. A mental health specialist can help determine which treatment is best for you. Some people see improvement with one therapy. However, other people require a combination of therapies.   This information is not intended to replace advice given to you by your health care provider. Make sure you discuss any questions you have with your health care provider.   Document Released: 07/11/2012 Document Revised: 04/06/2014 Document Reviewed: 07/11/2012 Elsevier Interactive Patient Education 2016 Elsevier Inc.  

## 2015-12-31 NOTE — Telephone Encounter (Signed)
I use Buspar frequently in treatment of anxiety. What concerns does she have specifically? I see that she's on My Chart, I'm happy to address her concerns there. We could alwasy increase her Lexapro before adding on an additional medication as she's on a very low dose.

## 2015-12-31 NOTE — Telephone Encounter (Signed)
Noted. Mychart message sent.

## 2015-12-31 NOTE — Telephone Encounter (Signed)
Pt called because she was in recently and saw NVR Incegina Baity.  They discussed her going on Buspirone and she has some questions and concerns about this medication.  Can you please contact her to discuss. Thanks!

## 2015-12-31 NOTE — Telephone Encounter (Signed)
Spoken and notified patient of Kate's comments. Patient stated that she was wondering if she can take Buspar and Lexapro together. She stated that it would be great if Jae DireKate can send her message thru MyChart. Patient stated that we already tried higher dosage of 10 mg Lexapro and it made her feel bad.

## 2015-12-31 NOTE — Progress Notes (Signed)
Subjective:    Patient ID: Casey Pope, female    DOB: 08/17/1985, 30 y.o.   MRN: [id number]  HPI  Pt presents to the clinic today with increased situational anxiety related to her work environment. This has been an ongoing issue but she noticed it seemed worse several weeks. 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 She has been taking Lexapro 5 mg for over a year for generalized anxiety disorder with success until the last few weeks. She has tried Lexapro10 mg at therapy initiation and does not want to increase her dose due to feelings of lethargy. She denies chest pain, palpitations, changes in in appetite and sleep. Denies s/s of depression or SI.   Review of Systems  Past Medical History:  Diagnosis Date  . Asthma    as a child - rarely uses inhaler  . Lower back pain    ? herniated disc  . Plantar fasciitis     Current Outpatient Prescriptions  Medication Sig Dispense Refill  . escitalopram (LEXAPRO) 5 MG tablet TAKE 1 TABLET BY MOUTH EVERY DAY 90 tablet 0  . fluticasone (FLONASE) 50 MCG/ACT nasal spray Place 2 sprays into both nostrils daily. 16 g 0  . SUMAtriptan (IMITREX) 50 MG tablet Take 1 tablet by mouth at onset of migraine. May repeat in 2 hours if no resolve. Do not exceed 2 tablets in 24 hours. 10 tablet 0  . topiramate (TOPAMAX) 50 MG tablet TAKE 1 TABLET BY MOUTH TWICE A DAY 180 tablet 0  . busPIRone (BUSPAR) 5 MG tablet Take 1 tablet (5 mg total) by mouth daily as needed. 30 tablet 2   No current facility-administered medications for this visit.     Allergies  Allergen Reactions  . Sulfa Antibiotics Hives  . Ciprofloxacin   . Amoxicillin Rash  . Nickel Rash    Family History  Problem Relation Age of Onset  . Hypertension Mother   . Diabetes Mother   . Arthritis Father   . Cancer Maternal Grandfather     lung    Social History   Social History  .  Marital status: Married    Spouse name: N/A  . Number of children: N/A  . Years of education: N/A   Occupational History  . Not on file.   Social History Main Topics  . Smoking status: Never Smoker  . Smokeless tobacco: Never Used  . Alcohol use 0.0 oz/week     Comment: socially but none with pregnancy  . Drug use: No  . Sexual activity: Yes    Birth control/ protection: None   Other Topics Concern  . Not on file   Social History Narrative   Married.   Has 3 three children.   Completed Masters degree, works as a Runner, broadcasting/film/videoteacher.   Enjoys spending time with her family.        Constitutional: Denies fever, malaise, fatigue, headache or abrupt weight changes.  Neurological: Denies dizziness, difficulty with memory, difficulty with speech or problems with balance and coordination.  Psych: Pt reports increased situational anxiety. Denies depression, SI/HI.  No other specific complaints in a complete review of systems (except as listed in HPI above).     Objective:   Physical Exam BP 100/70   Pulse 74   Temp 98.3 F (36.8 C) (Oral)   Wt 236 lb (107 kg)   SpO2 98%   BMI 40.51 kg/m  Wt Readings from Last 3 Encounters:  12/31/15 236 lb (107 kg)  11/29/15 237 lb (107.5 kg)  04/30/15 221 lb 8 oz (100.5 kg)    General: Appears herstated age, well developed, well nourished in NAD. Cardiovascular: Normal rate and rhythm. S1,S2 noted.  No murmur, rubs or gallops noted. No JVD or BLE edema. Pulmonary/Chest: Normal effort and positive vesicular breath sounds. No respiratory distress. No wheezes, rales or ronchi noted.  Neurological: Alert and oriented.  Psychiatric: Mood and affect normal. Behavior is normal. Judgment and thought content normal.     BMET    Component Value Date/Time   NA 138 01/22/2015 1635   K 3.8 01/22/2015 1635   CL 107 01/22/2015 1635   CO2 23 01/22/2015 1635   GLUCOSE 82 01/22/2015 1635   BUN 8 01/22/2015 1635   CREATININE 0.69 01/22/2015 1635    CALCIUM 8.9 01/22/2015 1635    Lipid Panel  No results found for: CHOL, TRIG, HDL, CHOLHDL, VLDL, LDLCALC  CBC    Component Value Date/Time   WBC 9.9 02/28/2014 0600   RBC 3.56 (L) 02/28/2014 0600   HGB 10.8 (L) 02/28/2014 0600   HCT 32.1 (L) 02/28/2014 0600   PLT 159 02/28/2014 0600   MCV 90.2 02/28/2014 0600   MCH 30.3 02/28/2014 0600   MCHC 33.6 02/28/2014 0600   RDW 13.5 02/28/2014 0600    Hgb A1C No results found for: HGBA1C           Assessment & Plan:  Anxiety:  Secondary to work related stress She asks for Xanax- I advised her I did not think this was appropriate eRx for Buspirone 5 mg for increased anxiety Continue to take Lexapro 5 mg daily  Call the office in two weeks if anxiety has not improved or has worsened.  Nicki Reaper, NP

## 2016-02-06 ENCOUNTER — Encounter: Payer: Self-pay | Admitting: Primary Care

## 2016-02-06 ENCOUNTER — Other Ambulatory Visit: Payer: Self-pay | Admitting: Primary Care

## 2016-02-06 ENCOUNTER — Ambulatory Visit (INDEPENDENT_AMBULATORY_CARE_PROVIDER_SITE_OTHER): Payer: BC Managed Care – PPO | Admitting: Primary Care

## 2016-02-06 VITALS — BP 112/76 | HR 71 | Temp 97.8°F | Ht 64.0 in | Wt 238.1 lb

## 2016-02-06 DIAGNOSIS — R519 Headache, unspecified: Secondary | ICD-10-CM

## 2016-02-06 DIAGNOSIS — L700 Acne vulgaris: Secondary | ICD-10-CM

## 2016-02-06 DIAGNOSIS — R51 Headache: Principal | ICD-10-CM

## 2016-02-06 DIAGNOSIS — F411 Generalized anxiety disorder: Secondary | ICD-10-CM

## 2016-02-06 DIAGNOSIS — G43901 Migraine, unspecified, not intractable, with status migrainosus: Secondary | ICD-10-CM | POA: Diagnosis not present

## 2016-02-06 MED ORDER — BUSPIRONE HCL 10 MG PO TABS: 10.0000 mg | ORAL_TABLET | Freq: Two times a day (BID) | ORAL | 1 refills | Status: DC

## 2016-02-06 MED ORDER — ADAPALENE 0.1 % EX GEL: Freq: Every day | CUTANEOUS | 0 refills | Status: DC

## 2016-02-06 MED ORDER — SUMATRIPTAN SUCCINATE 50 MG PO TABS: ORAL_TABLET | ORAL | 1 refills | Status: DC

## 2016-02-06 MED ORDER — HYDROXYZINE HCL 25 MG PO TABS: 25.0000 mg | ORAL_TABLET | Freq: Two times a day (BID) | ORAL | 1 refills | Status: DC | PRN

## 2016-02-06 NOTE — Progress Notes (Signed)
Subjective:    Patient ID: Casey MapleHilary Suzanne Bunch Pope, female    DOB: 08/31/1985, 30 y.o.   MRN: [id number]  HPI  [name] is a 30 year old female who presents today with multiple complaints.  1) Migraines: Last evaluated for this in October 2016. At that time she was managed on Topamax 100 mg HS and Imitrex PRN. She had no migraines all summer long and felt well managed on Topamax. Her migraines began again since she started back at work this school year. 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   She's experiencing migraines once weekly on average. Recently she's experienced a migraine which kept her from leaving the house. She does feel as though the Topamax is helping overall and realizes that work is contributing.   2) Generalized Anxiety Disorder: Currently managed on Buspar 5mg  once daily and Lexapro 5mg . She presented in early October with complaints of situational anxiety related to work environment. She was asking for Xanax during that visit which seemed inappropriate so she was provided with a prescription for Buspar 5 mg. Since her last visit she's experiencing symptoms of irritability, tearfulness, little intereste in doing things. GAD 7 score of 20. She denies SI/HI.  Since her increased anxiety she's noticed frequent breakouts of acne which is unusual for her. She tried a sample of Differin gel recently and noticed improvement. She would like to try this as a prescription.   Review of Systems  Eyes: Positive for photophobia.  Respiratory: Negative for shortness of breath.   Cardiovascular: Negative for chest pain.  Gastrointestinal: Positive for nausea.  Neurological: Positive for headaches.  Psychiatric/Behavioral: Negative for sleep disturbance and suicidal ideas. The patient is nervous/anxious.        Past Medical History:  Diagnosis Date  . Asthma    as a child - rarely uses inhaler  . Lower back pain    ?  herniated disc  . Plantar fasciitis      Social History   Social History  . Marital status: Married    Spouse name: N/A  . Number of children: N/A  . Years of education: N/A   Occupational History  . Not on file.   Social History Main Topics  . Smoking status: Never Smoker  . Smokeless tobacco: Never Used  . Alcohol use 0.0 oz/week     Comment: socially but none with pregnancy  . Drug use: No  . Sexual activity: Yes    Birth control/ protection: None   Other Topics Concern  . Not on file   Social History Narrative   Married.   Has 3 three children.   Completed Masters degree, works as a Runner, broadcasting/film/videoteacher.   Enjoys spending time with her family.       Past Surgical History:  Procedure Laterality Date  . CESAREAN SECTION    . CESAREAN SECTION WITH BILATERAL TUBAL LIGATION Bilateral 02/27/2014   Procedure: CESAREAN SECTION WITH BILATERAL TUBAL LIGATION;  Surgeon: Mitchel HonourMegan Morris, DO;  Location: [facility];  Service: Obstetrics;  Laterality: Bilateral;  Repeat  edc 03/06/14  . CHOLECYSTECTOMY    . WISDOM TOOTH EXTRACTION      Family History  Problem Relation Age of Onset  . Hypertension Mother   . Diabetes Mother   . Arthritis Father   . Cancer Maternal Grandfather     lung    Allergies  Allergen Reactions  . Sulfa Antibiotics Hives  . Ciprofloxacin   .  Amoxicillin Rash  . Nickel Rash    Current Outpatient Prescriptions on File Prior to Visit  Medication Sig Dispense Refill  . escitalopram (LEXAPRO) 5 MG tablet TAKE 1 TABLET BY MOUTH EVERY DAY 90 tablet 0  . topiramate (TOPAMAX) 50 MG tablet TAKE 1 TABLET BY MOUTH TWICE A DAY 180 tablet 0   No current facility-administered medications on file prior to visit.     BP 112/76   Pulse 71   Temp 97.8 F (36.6 C) (Oral)   Ht 5\' 4"  (1.626 m)   Wt 238 lb 1.9 oz (108 kg)   LMP 01/30/2016   SpO2 99%   BMI 40.87 kg/m    Objective:   Physical Exam  Constitutional: She appears well-nourished.  Neck: Neck supple.    Cardiovascular: Normal rate and regular rhythm.   Pulmonary/Chest: Effort normal and breath sounds normal.  Skin: Skin is warm and dry.  Psychiatric: She has a normal mood and affect.          Assessment & Plan:

## 2016-02-06 NOTE — Patient Instructions (Signed)
We've increased your Buspar dose to 10 mg. Take 1 tablet by mouth twice daily. Continue Lexapro 5 mg.  You may take the hydroxyzine up to twice daily as needed for breakthrough anxiety/panic attacks.  Apply the acne gel at bedtime on a clean face.  Please schedule a follow up visit in 4-6 weeks for re-evaluation of anxiety and migraines.  It was a pleasure to see you today!

## 2016-02-06 NOTE — Assessment & Plan Note (Signed)
Uncontrolled. GAD 7 score of 20 today. Suspect occupation is cause.  Long discussion today regarding treatment options. Highly recommend CBT for which she refuses at this time. Will continue Lexapro 5 mg. Increase Buspar to 10 mg BID. Rx for Hydroxyzine 25 mg sent to pharmacy for breakthrough anxiety.  Follow up in 4-6 weeks for re-evaluation.

## 2016-02-06 NOTE — Assessment & Plan Note (Signed)
Overall improved on Topamax 100 mg HS over the summer. Migraines restarted after going back to school. Suspect work and anxiety to be cause. Will treat anxiety as this will likely cause a reduction in migraines. Refill for Imitrex sent to pharmacy.

## 2016-02-06 NOTE — Progress Notes (Signed)
Pre visit review using our clinic review tool, if applicable. No additional management support is needed unless otherwise documented below in the visit note. 

## 2016-03-02 ENCOUNTER — Ambulatory Visit (INDEPENDENT_AMBULATORY_CARE_PROVIDER_SITE_OTHER): Payer: BC Managed Care – PPO | Admitting: Primary Care

## 2016-03-02 ENCOUNTER — Encounter: Payer: Self-pay | Admitting: Primary Care

## 2016-03-02 VITALS — BP 124/86 | HR 99 | Temp 98.7°F | Ht 64.0 in | Wt 235.1 lb

## 2016-03-02 DIAGNOSIS — J029 Acute pharyngitis, unspecified: Secondary | ICD-10-CM

## 2016-03-02 LAB — POCT RAPID STREP A (OFFICE): Rapid Strep A Screen: NEGATIVE

## 2016-03-02 MED ORDER — METHYLPREDNISOLONE ACETATE 80 MG/ML IJ SUSP
80.0000 mg | Freq: Once | INTRAMUSCULAR | Status: AC
  Administered 2016-03-02: 80 mg via INTRAMUSCULAR

## 2016-03-02 MED ORDER — AZITHROMYCIN 250 MG PO TABS: ORAL_TABLET | ORAL | 0 refills | Status: DC

## 2016-03-02 NOTE — Progress Notes (Signed)
Pre visit review using our clinic review tool, if applicable. No additional management support is needed unless otherwise documented below in the visit note. 

## 2016-03-02 NOTE — Patient Instructions (Signed)
Start Azithromycin antibiotics. Take 2 tablets by mouth today, then 1 tablet daily for 4 additional days.  You were provided with an injection of steroids to reduce swelling. Switch to Tylenol for pain, fevers, body aches for today.  Ensure you are staying hydrated with water and rest.  It was a pleasure to see you today!

## 2016-03-02 NOTE — Progress Notes (Signed)
Subjective:    Patient ID: Casey Pope, female    DOB: 12/22/1985, 30 y.o.   MRN: [id number]  HPI  Ms. Casey ConroyBunch Pope is a 30 year old female who presents today with a chief complaint of URI symptoms. She reports symptoms of fever, sore throat, difficulty swallowing. She has no appetite. She woke up Saturday with a fever of 102.3 which lasted for most of the weekend. She was evaluated at a [facility] on Saturday this past weekend. She underwent evaluation for strep and influenza which was negative. She was told that she was over the age range for strep throat. She's been taking ibuprofen every 4 hours, using magic mouthwash, and has been resting all weekend. The ibuprofen will work temporarily for her fevers and body aches. Her fever this morning was 101.8. She denies cough and sick contacts.  Review of Systems  Constitutional: Positive for chills, fatigue and fever.  HENT: Positive for sore throat. Negative for congestion and ear pain.   Respiratory: Negative for cough and shortness of breath.   Cardiovascular: Negative for chest pain.       Past Medical History:  Diagnosis Date  . Asthma    as a child - rarely uses inhaler  . Lower back pain    ? herniated disc  . Plantar fasciitis      Social History   Social History  . Marital status: Married    Spouse name: N/A  . Number of children: N/A  . Years of education: N/A   Occupational History  . Not on file.   Social History Main Topics  . Smoking status: Never Smoker  . Smokeless tobacco: Never Used  . Alcohol use 0.0 oz/week     Comment: socially but none with pregnancy  . Drug use: No  . Sexual activity: Yes    Birth control/ protection: None   Other Topics Concern  . Not on file   Social History Narrative   Married.   Has 3 three children.   Completed Masters degree, works as a Runner, broadcasting/film/videoteacher.   Enjoys spending time with her family.       Past Surgical History:  Procedure Laterality Date    . CESAREAN SECTION    . CESAREAN SECTION WITH BILATERAL TUBAL LIGATION Bilateral 02/27/2014   Procedure: CESAREAN SECTION WITH BILATERAL TUBAL LIGATION;  Surgeon: Mitchel HonourMegan Morris, DO;  Location: [facility];  Service: Obstetrics;  Laterality: Bilateral;  Repeat  edc 03/06/14  . CHOLECYSTECTOMY    . WISDOM TOOTH EXTRACTION      Family History  Problem Relation Age of Onset  . Hypertension Mother   . Diabetes Mother   . Arthritis Father   . Cancer Maternal Grandfather     lung    Allergies  Allergen Reactions  . Sulfa Antibiotics Hives  . Ciprofloxacin   . Amoxicillin Rash  . Nickel Rash    Current Outpatient Prescriptions on File Prior to Visit  Medication Sig Dispense Refill  . adapalene (DIFFERIN) 0.1 % gel Apply topically at bedtime. 45 g 0  . busPIRone (BUSPAR) 10 MG tablet Take 1 tablet (10 mg total) by mouth 2 (two) times daily. 60 tablet 1  . escitalopram (LEXAPRO) 5 MG tablet TAKE 1 TABLET BY MOUTH EVERY DAY 90 tablet 0  . hydrOXYzine (ATARAX/VISTARIL) 25 MG tablet Take 1 tablet (25 mg total) by mouth 2 (two) times daily as needed for anxiety. 60 tablet 1  . SUMAtriptan (IMITREX) 50 MG tablet Take  1 tablet by mouth at onset of migraine. May repeat in 2 hours if no resolve. Do not exceed 2 tablets in 24 hours. 10 tablet 1  . topiramate (TOPAMAX) 50 MG tablet TAKE 1 TABLET BY MOUTH TWICE A DAY 180 tablet 0   No current facility-administered medications on file prior to visit.     BP 124/86   Pulse 99   Temp 98.7 F (37.1 C) (Oral)   Ht 5\' 4"  (1.626 m)   Wt 235 lb 1.9 oz (106.6 kg)   LMP 02/29/2016   SpO2 98%   BMI 40.36 kg/m    Objective:   Physical Exam  Constitutional: She appears well-nourished. She appears ill.  HENT:  Right Ear: Tympanic membrane and ear canal normal.  Left Ear: Tympanic membrane and ear canal normal.  Nose: Right sinus exhibits no maxillary sinus tenderness and no frontal sinus tenderness. Left sinus exhibits no maxillary sinus tenderness and  no frontal sinus tenderness.  Mouth/Throat: Posterior oropharyngeal edema and posterior oropharyngeal erythema present. No oropharyngeal exudate.  Eyes: Conjunctivae are normal.  Neck: Neck supple.  Cardiovascular: Normal rate and regular rhythm.   Pulmonary/Chest: Effort normal and breath sounds normal. She has no wheezes. She has no rales.  Lymphadenopathy:    She has no cervical adenopathy.  Skin: Skin is warm and dry.          Assessment & Plan:  Sore Throat:  Also with high fevers 101+, fatigue, difficulty swallowing. Temporary improvement with OTC treatment. Exam with erythema, edema to posterior pharynx. Appears acutely ill.  Rapid Strep: Negative. Given high fevers, examination, and presentation will treat for presumed bacterial involvement. Rx for Zpak sent to pharmacy [facility] allergy), IM Depo Medrol 80 provided in office for inflammation.  Fluids, rest, continue ibuprofen.  Casey Pope,Casey Cockrum Kendal, NP

## 2016-03-04 ENCOUNTER — Encounter: Payer: Self-pay | Admitting: Primary Care

## 2016-03-05 ENCOUNTER — Ambulatory Visit: Payer: BC Managed Care – PPO | Admitting: Primary Care

## 2016-03-14 ENCOUNTER — Other Ambulatory Visit: Payer: Self-pay | Admitting: Primary Care

## 2016-03-14 DIAGNOSIS — F411 Generalized anxiety disorder: Secondary | ICD-10-CM

## 2016-03-16 NOTE — Telephone Encounter (Signed)
Ok to refill? Electronically refill request for   escitalopram (LEXAPRO) 5 MG tablet  Last prescribed on 09/10/2015. Last seen on

## 2016-04-14 ENCOUNTER — Other Ambulatory Visit: Payer: Self-pay | Admitting: Primary Care

## 2016-04-14 DIAGNOSIS — F411 Generalized anxiety disorder: Secondary | ICD-10-CM

## 2016-04-14 NOTE — Telephone Encounter (Signed)
Ok to refill? Electronically refill request for   busPIRone (BUSPAR) 10 MG tablet  Last prescribed on 02/06/2016. Last seen on 03/02/2016.

## 2016-05-07 ENCOUNTER — Other Ambulatory Visit: Payer: Self-pay | Admitting: Primary Care

## 2016-05-07 DIAGNOSIS — F411 Generalized anxiety disorder: Secondary | ICD-10-CM

## 2016-05-07 NOTE — Telephone Encounter (Signed)
Ok to refill? Electronically refill request for busPIRone (BUSPAR) 10 MG tablet #60 with 1 refill. Last prescribed on 02/06/2016. Last seen on 03/02/2016.

## 2016-06-14 ENCOUNTER — Other Ambulatory Visit: Payer: Self-pay | Admitting: Primary Care

## 2016-06-14 DIAGNOSIS — F411 Generalized anxiety disorder: Secondary | ICD-10-CM

## 2016-06-14 DIAGNOSIS — R51 Headache: Secondary | ICD-10-CM

## 2016-06-14 DIAGNOSIS — R519 Headache, unspecified: Secondary | ICD-10-CM

## 2016-10-16 ENCOUNTER — Encounter: Payer: Self-pay | Admitting: Internal Medicine

## 2016-10-16 ENCOUNTER — Ambulatory Visit (INDEPENDENT_AMBULATORY_CARE_PROVIDER_SITE_OTHER): Payer: BC Managed Care – PPO | Admitting: Internal Medicine

## 2016-10-16 VITALS — BP 100/80 | HR 76 | Temp 98.7°F | Wt 237.2 lb

## 2016-10-16 DIAGNOSIS — J309 Allergic rhinitis, unspecified: Secondary | ICD-10-CM | POA: Diagnosis not present

## 2016-10-16 DIAGNOSIS — R519 Headache, unspecified: Secondary | ICD-10-CM

## 2016-10-16 DIAGNOSIS — R220 Localized swelling, mass and lump, head: Secondary | ICD-10-CM | POA: Diagnosis not present

## 2016-10-16 DIAGNOSIS — R51 Headache: Secondary | ICD-10-CM

## 2016-10-16 MED ORDER — DOXYCYCLINE HYCLATE 100 MG PO TABS: 100.0000 mg | ORAL_TABLET | Freq: Two times a day (BID) | ORAL | 0 refills | Status: DC

## 2016-10-16 NOTE — Progress Notes (Signed)
Chief Complaint  Patient presents with  . Nasal Congestion    Right side of face is swollen and is painful   . Headache    HPI: Casey Pope 31 y.o.  SDA PCP NA Right eye cheeck swollen and  chedkc and headaches   X 2-3 days      Different than migraines   Ice face.   Pain and then  Congestion   itchiy and no cough . She's had this before with something similar to a sinus infection She has postnasal drainage and congestion. She is also had problems with her right upper teeth history of extractions and other issues she also has some jaw pain when she chews area There is no fever. There is some tenderness in her right anterior neck also.  ROS: See pertinent positives and negatives per HPI. No fever neuro sx   Rash shingles   rash  Past Medical History:  Diagnosis Date  . Asthma    as a child - rarely uses inhaler  . Lower back pain    ? herniated disc  . Plantar fasciitis     Family History  Problem Relation Age of Onset  . Hypertension Mother   . Diabetes Mother   . Arthritis Father   . Cancer Maternal Grandfather        lung    Social History   Social History  . Marital status: Married    Spouse name: N/A  . Number of children: N/A  . Years of education: N/A   Social History Main Topics  . Smoking status: Never Smoker  . Smokeless tobacco: Never Used  . Alcohol use 0.0 oz/week     Comment: socially but none with pregnancy  . Drug use: No  . Sexual activity: Yes    Birth control/ protection: None   Other Topics Concern  . None   Social History Narrative   Married.   Has 3 three children.   Completed Masters degree, works as a Runner, broadcasting/film/video.   Enjoys spending time with her family.       Outpatient Medications Prior to Visit  Medication Sig Dispense Refill  . adapalene (DIFFERIN) 0.1 % gel Apply topically at bedtime. 45 g 0  . busPIRone (BUSPAR) 10 MG tablet TAKE 1 TABLET BY MOUTH TWICE A DAY 180 tablet 1  . escitalopram (LEXAPRO) 5 MG tablet  TAKE 1 TABLET BY MOUTH EVERY DAY 90 tablet 0  . hydrOXYzine (ATARAX/VISTARIL) 25 MG tablet Take 1 tablet (25 mg total) by mouth 2 (two) times daily as needed for anxiety. 60 tablet 1  . SUMAtriptan (IMITREX) 50 MG tablet Take 1 tablet by mouth at onset of migraine. May repeat in 2 hours if no resolve. Do not exceed 2 tablets in 24 hours. 10 tablet 1  . topiramate (TOPAMAX) 50 MG tablet TAKE 1 TABLET BY MOUTH TWICE A DAY 180 tablet 0  . azithromycin (ZITHROMAX) 250 MG tablet Take 2 tablets by mouth today, then 1 tablet daily for 4 additional days. 6 tablet 0   No facility-administered medications prior to visit.      EXAM:  BP 100/80 (BP Location: Left Arm, Patient Position: Sitting, Cuff Size: Large)   Pulse 76   Temp 98.7 F (37.1 C) (Oral)   Wt 237 lb 3.2 oz (107.6 kg)   SpO2 98%   BMI 40.72 kg/m   Body mass index is 40.72 kg/m.  GENERAL: vitals reviewed and listed above, alert, oriented, appears well  hydrated and in no acute distress mild puffiness right cheek  No rash some erythema bilaterally  HEENT: atraumatic, conjunctiva  clear, no obvious abnormalities on inspection of external nose and ears tms nl tender right cheek  And tmj area OP : no lesion edema or exudate  1_ red no lesion NECK: no obvious masses on inspection  Tender right ac node  No pc  LUNGS: clear to auscultation bilaterally, no wheezes, rales or rhonchi, good air movement CV: HRRR, no clubbing cyanosis or  peripheral edema nl cap refill  Neuro is non focal  PSYCH: pleasant and cooperative, no obvious depression or anxiety  ASSESSMENT AND PLAN:  Discussed the following assessment and plan:  Pressure and pain of right side of face  Swelling of right side of face  Allergic sinusitis Poss sinusitis atypical vs   Dental inflammation  Infection  Plus poss tmj involvement  .  Empiric rx  And fu   Contact if get  Rash like shingles etc    Expectant management.  -Patient advised to return or notify health care  team  if symptoms worsen ,persist or new concerns arise.  Patient Instructions  Uncertain if from sinus or dental but  Adding antibiotic because of swelling and severityu of sx.   Add flonase  Or other cortisone  Every day  To suppress allergy nose and sinus inflammation.   compresses  And expect improvement in the next 3 days .    Fu pcp or other if  persistent or progressive     Burna MortimerWanda K. Panosh M.D.

## 2016-10-16 NOTE — Patient Instructions (Signed)
Uncertain if from sinus or dental but  Adding antibiotic because of swelling and severityu of sx.   Add flonase  Or other cortisone  Every day  To suppress allergy nose and sinus inflammation.   compresses  And expect improvement in the next 3 days .    Fu pcp or other if  persistent or progressive

## 2016-11-04 ENCOUNTER — Other Ambulatory Visit: Payer: Self-pay | Admitting: Primary Care

## 2016-11-04 DIAGNOSIS — R519 Headache, unspecified: Secondary | ICD-10-CM

## 2016-11-04 DIAGNOSIS — R51 Headache: Secondary | ICD-10-CM

## 2016-11-04 DIAGNOSIS — F411 Generalized anxiety disorder: Secondary | ICD-10-CM

## 2016-11-27 ENCOUNTER — Encounter: Payer: Self-pay | Admitting: Primary Care

## 2016-11-27 ENCOUNTER — Ambulatory Visit (INDEPENDENT_AMBULATORY_CARE_PROVIDER_SITE_OTHER): Payer: BC Managed Care – PPO | Admitting: Primary Care

## 2016-11-27 VITALS — BP 126/82 | HR 77 | Temp 98.1°F | Ht 64.0 in | Wt 233.0 lb

## 2016-11-27 DIAGNOSIS — R1084 Generalized abdominal pain: Secondary | ICD-10-CM

## 2016-11-27 LAB — COMPREHENSIVE METABOLIC PANEL
ALT: 17 U/L (ref 0–35)
AST: 15 U/L (ref 0–37)
Albumin: 3.9 g/dL (ref 3.5–5.2)
Alkaline Phosphatase: 54 U/L (ref 39–117)
BUN: 6 mg/dL (ref 6–23)
CHLORIDE: 106 meq/L (ref 96–112)
CO2: 24 meq/L (ref 19–32)
Calcium: 9 mg/dL (ref 8.4–10.5)
Creatinine, Ser: 0.77 mg/dL (ref 0.40–1.20)
GFR: 92.53 mL/min (ref >60.00)
GLUCOSE: 85 mg/dL (ref 70–99)
POTASSIUM: 3.3 meq/L — AB (ref 3.5–5.1)
SODIUM: 136 meq/L (ref 135–145)
Total Bilirubin: 0.3 mg/dL (ref 0.2–1.2)
Total Protein: 6.5 g/dL (ref 6.0–8.3)

## 2016-11-27 LAB — CBC WITH DIFFERENTIAL/PLATELET
BASOS PCT: 0.7 % (ref 0.0–3.0)
Basophils Absolute: 0.1 10*3/uL (ref 0.0–0.1)
EOS ABS: 0.2 10*3/uL (ref 0.0–0.7)
EOS PCT: 2.6 % (ref 0.0–5.0)
HCT: 42 % (ref 36.0–46.0)
Hemoglobin: 13.8 g/dL (ref 12.0–15.0)
LYMPHS ABS: 2.3 10*3/uL (ref 0.7–4.0)
Lymphocytes Relative: 31.2 % (ref 12.0–46.0)
MCHC: 32.9 g/dL (ref 30.0–36.0)
MCV: 90.1 fl (ref 78.0–100.0)
Monocytes Absolute: 0.5 10*3/uL (ref 0.1–1.0)
Monocytes Relative: 6.6 % (ref 3.0–12.0)
NEUTROS PCT: 58.9 % (ref 43.0–77.0)
Neutro Abs: 4.4 10*3/uL (ref 1.4–7.7)
Platelets: 268 10*3/uL (ref 150.0–400.0)
RBC: 4.67 Mil/uL (ref 3.87–5.11)
RDW: 13.5 % (ref 11.5–15.5)
WBC: 7.4 10*3/uL (ref 4.0–10.5)

## 2016-11-27 MED ORDER — DICYCLOMINE HCL 10 MG PO CAPS: 10.0000 mg | ORAL_CAPSULE | Freq: Three times a day (TID) | ORAL | 0 refills | Status: DC

## 2016-11-27 NOTE — Patient Instructions (Signed)
You may take dicyclomine before each meal and at bedtime as needed for stomach symptoms.  Use imodium as needed.  Complete lab work prior to leaving today. I will notify you of your results once received.   Please call me next week if no improvement in symptoms.   It was a pleasure to see you today!

## 2016-11-27 NOTE — Addendum Note (Signed)
Addended by: Gregery NaVALENCIA, Angeligue Bowne P on: 11/27/2016 03:23 PM   Modules accepted: Orders

## 2016-11-27 NOTE — Progress Notes (Signed)
Subjective:    Patient ID: Casey Pope, female    DOB: 03/18/1986, 31 y.o.   MRN: [id number]  HPI  [name] is a 31 year old female who presents today with a chief complaint of abdominal pain. She also reports diarrhea, abdominal cramping, abdominal bloating, increased stress at work.   Her pain is located to the bilateral lower abdomen and RUQ. She had her gall bladder removed 10 years ago. She endorses that diarrhea was initially mucous-like that lasted 3-4 days, now is more like liquid stool. She is experiencing diarrhea 3-10 times daily. She's not eating much as she's afraid of diarrhea. She denies nausea/vomiting, constipation, fevers, decrease in appetite, bloody stools. She's under increasing stress at work that has been present for the past 1 week.   Review of Systems  Constitutional: Negative for fever.  Respiratory: Negative for shortness of breath.   Cardiovascular: Negative for chest pain.  Gastrointestinal: Positive for abdominal pain and diarrhea. Negative for blood in stool, constipation, nausea and vomiting.  Genitourinary: Negative for dysuria, frequency and vaginal discharge.       Past Medical History:  Diagnosis Date  . Asthma    as a child - rarely uses inhaler  . Lower back pain    ? herniated disc  . Plantar fasciitis      Social History   Social History  . Marital status: Married    Spouse name: N/A  . Number of children: N/A  . Years of education: N/A   Occupational History  . Not on file.   Social History Main Topics  . Smoking status: Never Smoker  . Smokeless tobacco: Never Used  . Alcohol use 0.0 oz/week     Comment: socially but none with pregnancy  . Drug use: No  . Sexual activity: Yes    Birth control/ protection: None   Other Topics Concern  . Not on file   Social History Narrative   Married.   Has 3 three children.   Completed Masters degree, works as a Runner, broadcasting/film/videoteacher.   Enjoys spending time with her family.         Past Surgical History:  Procedure Laterality Date  . CESAREAN SECTION    . CESAREAN SECTION WITH BILATERAL TUBAL LIGATION Bilateral 02/27/2014   Procedure: CESAREAN SECTION WITH BILATERAL TUBAL LIGATION;  Surgeon: Mitchel HonourMegan Morris, DO;  Location: [facility];  Service: Obstetrics;  Laterality: Bilateral;  Repeat  edc 03/06/14  . CHOLECYSTECTOMY    . WISDOM TOOTH EXTRACTION      Family History  Problem Relation Age of Onset  . Hypertension Mother   . Diabetes Mother   . Arthritis Father   . Cancer Maternal Grandfather        lung    Allergies  Allergen Reactions  . Sulfa Antibiotics Hives  . Ciprofloxacin   . Amoxicillin Rash  . Nickel Rash    Current Outpatient Prescriptions on File Prior to Visit  Medication Sig Dispense Refill  . adapalene (DIFFERIN) 0.1 % gel Apply topically at bedtime. 45 g 0  . busPIRone (BUSPAR) 10 MG tablet TAKE 1 TABLET BY MOUTH TWICE A DAY 180 tablet 1  . escitalopram (LEXAPRO) 5 MG tablet TAKE 1 TABLET BY MOUTH EVERY DAY 90 tablet 0  . hydrOXYzine (ATARAX/VISTARIL) 25 MG tablet Take 1 tablet (25 mg total) by mouth 2 (two) times daily as needed for anxiety. 60 tablet 1  . SUMAtriptan (IMITREX) 50 MG tablet Take 1 tablet by mouth at  onset of migraine. May repeat in 2 hours if no resolve. Do not exceed 2 tablets in 24 hours. 10 tablet 1  . topiramate (TOPAMAX) 50 MG tablet TAKE 1 TABLET BY MOUTH TWICE A DAY 180 tablet 0   No current facility-administered medications on file prior to visit.     BP 126/82   Pulse 77   Temp 98.1 F (36.7 C) (Oral)   Ht 5\' 4"  (1.626 m)   Wt 233 lb (105.7 kg)   LMP 11/13/2016   SpO2 97%   BMI 39.99 kg/m    Objective:   Physical Exam  Constitutional: She appears well-nourished.  Neck: Neck supple.  Cardiovascular: Normal rate and regular rhythm.   Pulmonary/Chest: Effort normal and breath sounds normal.  Abdominal: Soft. Normal appearance and bowel sounds are normal. There is tenderness in the right lower  quadrant, periumbilical area and left lower quadrant.  Skin: Skin is warm and dry.          Assessment & Plan:  Abdominal Pain:  Also with bloating, diarrhea. Exam today not suspicious for acute appendicitis. Consider diverticulitis. Most likely IBS given symptoms and increased stress at work. Will check CBC, CMP today.  Rx for dicyclomine sent to pharmacy for treatment of likely IBS. If symptoms progress then would recommend imaging. She will update next week if no improvement, emergency department precautions provided.  Morrie Sheldon, NP

## 2016-11-29 ENCOUNTER — Emergency Department (HOSPITAL_COMMUNITY): Payer: BC Managed Care – PPO

## 2016-11-29 ENCOUNTER — Encounter (HOSPITAL_COMMUNITY): Payer: Self-pay | Admitting: *Deleted

## 2016-11-29 ENCOUNTER — Ambulatory Visit (HOSPITAL_COMMUNITY)
Admission: EM | Admit: 2016-11-29 | Discharge: 2016-11-29 | Disposition: A | Discharge status: Home / Self Care | Payer: BC Managed Care – PPO

## 2016-11-29 ENCOUNTER — Inpatient Hospital Stay (HOSPITAL_COMMUNITY)
Admission: EM | Admit: 2016-11-29 | Discharge: 2016-12-02 | DRG: 373 | Disposition: A | Discharge status: Home / Self Care | Payer: BC Managed Care – PPO | Attending: Internal Medicine | Admitting: Internal Medicine

## 2016-11-29 ENCOUNTER — Encounter (HOSPITAL_COMMUNITY): Payer: Self-pay | Admitting: Emergency Medicine

## 2016-11-29 DIAGNOSIS — R1084 Generalized abdominal pain: Secondary | ICD-10-CM | POA: Diagnosis not present

## 2016-11-29 DIAGNOSIS — R519 Headache, unspecified: Secondary | ICD-10-CM | POA: Diagnosis present

## 2016-11-29 DIAGNOSIS — K529 Noninfective gastroenteritis and colitis, unspecified: Secondary | ICD-10-CM | POA: Diagnosis present

## 2016-11-29 DIAGNOSIS — E876 Hypokalemia: Secondary | ICD-10-CM | POA: Diagnosis not present

## 2016-11-29 DIAGNOSIS — Z881 Allergy status to other antibiotic agents status: Secondary | ICD-10-CM

## 2016-11-29 DIAGNOSIS — Z880 Allergy status to penicillin: Secondary | ICD-10-CM

## 2016-11-29 DIAGNOSIS — Z9049 Acquired absence of other specified parts of digestive tract: Secondary | ICD-10-CM

## 2016-11-29 DIAGNOSIS — K389 Disease of appendix, unspecified: Secondary | ICD-10-CM | POA: Diagnosis not present

## 2016-11-29 DIAGNOSIS — Z91048 Other nonmedicinal substance allergy status: Secondary | ICD-10-CM

## 2016-11-29 DIAGNOSIS — A0472 Enterocolitis due to Clostridium difficile, not specified as recurrent: Secondary | ICD-10-CM

## 2016-11-29 DIAGNOSIS — F411 Generalized anxiety disorder: Secondary | ICD-10-CM | POA: Diagnosis not present

## 2016-11-29 DIAGNOSIS — R51 Headache: Secondary | ICD-10-CM | POA: Diagnosis present

## 2016-11-29 DIAGNOSIS — J45909 Unspecified asthma, uncomplicated: Secondary | ICD-10-CM | POA: Diagnosis present

## 2016-11-29 DIAGNOSIS — Z79899 Other long term (current) drug therapy: Secondary | ICD-10-CM

## 2016-11-29 HISTORY — DX: Noninfective gastroenteritis and colitis, unspecified: K52.9

## 2016-11-29 LAB — C-REACTIVE PROTEIN

## 2016-11-29 LAB — CBC
HCT: 44.2 % (ref 36.0–46.0)
Hemoglobin: 14.7 g/dL (ref 12.0–15.0)
MCH: 29.5 pg (ref 26.0–34.0)
MCHC: 33.3 g/dL (ref 30.0–36.0)
MCV: 88.6 fL (ref 78.0–100.0)
PLATELETS: 247 10*3/uL (ref 150–400)
RBC: 4.99 MIL/uL (ref 3.87–5.11)
RDW: 13.3 % (ref 11.5–15.5)
WBC: 8.3 10*3/uL (ref 4.0–10.5)

## 2016-11-29 LAB — URINALYSIS, ROUTINE W REFLEX MICROSCOPIC
BILIRUBIN URINE: NEGATIVE
GLUCOSE, UA: NEGATIVE mg/dL
Hgb urine dipstick: NEGATIVE
Ketones, ur: 20 mg/dL — AB
Nitrite: NEGATIVE
PROTEIN: NEGATIVE mg/dL
SPECIFIC GRAVITY, URINE: 1.024 (ref 1.005–1.030)
pH: 5 (ref 5.0–8.0)

## 2016-11-29 LAB — COMPREHENSIVE METABOLIC PANEL
ALBUMIN: 3.6 g/dL (ref 3.5–5.0)
ALK PHOS: 53 U/L (ref 38–126)
ALT: 20 U/L (ref 14–54)
ANION GAP: 9 (ref 5–15)
AST: 16 U/L (ref 15–41)
BUN: <5 mg/dL — ABNORMAL LOW (ref 6–20)
CHLORIDE: 108 mmol/L (ref 101–111)
CO2: 21 mmol/L — AB (ref 22–32)
CREATININE: 0.74 mg/dL (ref 0.44–1.00)
Calcium: 8.4 mg/dL — ABNORMAL LOW (ref 8.9–10.3)
GFR calc Af Amer: >60 mL/min (ref >60)
GFR calc non Af Amer: >60 mL/min (ref >60)
GLUCOSE: 86 mg/dL (ref 65–99)
POTASSIUM: 3.3 mmol/L — AB (ref 3.5–5.1)
SODIUM: 138 mmol/L (ref 135–145)
Total Bilirubin: 0.4 mg/dL (ref 0.3–1.2)
Total Protein: 6.5 g/dL (ref 6.5–8.1)

## 2016-11-29 LAB — I-STAT BETA HCG BLOOD, ED (MC, WL, AP ONLY): HCG, QUANTITATIVE: 5.2 m[IU]/mL — AB (ref <5)

## 2016-11-29 LAB — SEDIMENTATION RATE: SED RATE: 3 mm/h (ref 0–22)

## 2016-11-29 LAB — LIPASE, BLOOD: Lipase: 26 U/L (ref 11–51)

## 2016-11-29 LAB — HCG, QUANTITATIVE, PREGNANCY: hCG, Beta Chain, Quant, S: <1 m[IU]/mL (ref <5)

## 2016-11-29 MED ORDER — ACETAMINOPHEN 325 MG PO TABS: 650.0000 mg | ORAL_TABLET | Freq: Four times a day (QID) | ORAL | Status: DC | PRN

## 2016-11-29 MED ORDER — HYDROXYZINE HCL 25 MG PO TABS: 25.0000 mg | ORAL_TABLET | Freq: Two times a day (BID) | ORAL | Status: DC | PRN

## 2016-11-29 MED ORDER — ONDANSETRON HCL 4 MG PO TABS: 4.0000 mg | ORAL_TABLET | Freq: Four times a day (QID) | ORAL | Status: DC | PRN

## 2016-11-29 MED ORDER — ESCITALOPRAM OXALATE 10 MG PO TABS
5.0000 mg | ORAL_TABLET | Freq: Every day | ORAL | Status: DC
  Administered 2016-11-30 – 2016-12-01 (×2): 5 mg via ORAL
  Filled 2016-11-29 (×2): qty 1

## 2016-11-29 MED ORDER — TOPIRAMATE 25 MG PO TABS
50.0000 mg | ORAL_TABLET | Freq: Two times a day (BID) | ORAL | Status: DC
  Administered 2016-11-29 – 2016-12-02 (×6): 50 mg via ORAL
  Filled 2016-11-29 (×6): qty 2

## 2016-11-29 MED ORDER — FENTANYL CITRATE (PF) 100 MCG/2ML IJ SOLN
25.0000 ug | INTRAMUSCULAR | Status: DC | PRN
  Administered 2016-11-29 – 2016-11-30 (×2): 50 ug via INTRAVENOUS
  Filled 2016-11-29 (×2): qty 2

## 2016-11-29 MED ORDER — ENOXAPARIN SODIUM 40 MG/0.4ML ~~LOC~~ SOLN
40.0000 mg | SUBCUTANEOUS | Status: DC
  Administered 2016-11-30: 40 mg via SUBCUTANEOUS
  Filled 2016-11-29: qty 0.4

## 2016-11-29 MED ORDER — IOPAMIDOL (ISOVUE-300) INJECTION 61%
INTRAVENOUS | Status: AC
  Administered 2016-11-29: 100 mL via INTRAVENOUS
  Filled 2016-11-29: qty 100

## 2016-11-29 MED ORDER — SODIUM CHLORIDE 0.9 % IV BOLUS (SEPSIS)
1000.0000 mL | Freq: Once | INTRAVENOUS | Status: AC
  Administered 2016-11-29: 1000 mL via INTRAVENOUS

## 2016-11-29 MED ORDER — METRONIDAZOLE IN NACL 5-0.79 MG/ML-% IV SOLN
500.0000 mg | Freq: Once | INTRAVENOUS | Status: AC
  Administered 2016-11-29: 500 mg via INTRAVENOUS
  Filled 2016-11-29: qty 100

## 2016-11-29 MED ORDER — METRONIDAZOLE IN NACL 5-0.79 MG/ML-% IV SOLN
500.0000 mg | Freq: Three times a day (TID) | INTRAVENOUS | Status: DC
  Administered 2016-11-30 – 2016-12-02 (×7): 500 mg via INTRAVENOUS
  Filled 2016-11-29 (×8): qty 100

## 2016-11-29 MED ORDER — HYDRALAZINE HCL 20 MG/ML IJ SOLN: 10.0000 mg | INTRAMUSCULAR | Status: DC | PRN

## 2016-11-29 MED ORDER — CEFTRIAXONE SODIUM 2 G IJ SOLR
2.0000 g | INTRAMUSCULAR | Status: DC
  Filled 2016-11-29: qty 2

## 2016-11-29 MED ORDER — SODIUM CHLORIDE 0.9 % IV SOLN
INTRAVENOUS | Status: AC
  Administered 2016-11-29: 23:00:00 via INTRAVENOUS

## 2016-11-29 MED ORDER — ONDANSETRON HCL 4 MG/2ML IJ SOLN: 4.0000 mg | Freq: Four times a day (QID) | INTRAMUSCULAR | Status: DC | PRN

## 2016-11-29 MED ORDER — DEXTROSE 5 % IV SOLN
2.0000 g | Freq: Once | INTRAVENOUS | Status: AC
  Administered 2016-11-29: 2 g via INTRAVENOUS
  Filled 2016-11-29: qty 2

## 2016-11-29 MED ORDER — HYDROCODONE-ACETAMINOPHEN 5-325 MG PO TABS
1.0000 | ORAL_TABLET | ORAL | Status: DC | PRN
  Administered 2016-11-29 – 2016-12-01 (×8): 2 via ORAL
  Filled 2016-11-29 (×8): qty 2

## 2016-11-29 MED ORDER — POTASSIUM CHLORIDE CRYS ER 20 MEQ PO TBCR
30.0000 meq | EXTENDED_RELEASE_TABLET | Freq: Once | ORAL | Status: AC
  Administered 2016-11-29: 23:00:00 30 meq via ORAL
  Filled 2016-11-29: qty 1

## 2016-11-29 MED ORDER — ONDANSETRON HCL 4 MG/2ML IJ SOLN
4.0000 mg | Freq: Once | INTRAMUSCULAR | Status: AC
  Administered 2016-11-29: 4 mg via INTRAVENOUS
  Filled 2016-11-29: qty 2

## 2016-11-29 MED ORDER — ACETAMINOPHEN 650 MG RE SUPP: 650.0000 mg | Freq: Four times a day (QID) | RECTAL | Status: DC | PRN

## 2016-11-29 MED ORDER — MORPHINE SULFATE (PF) 4 MG/ML IV SOLN
2.0000 mg | Freq: Once | INTRAVENOUS | Status: DC
  Filled 2016-11-29: qty 1

## 2016-11-29 MED ORDER — BUSPIRONE HCL 10 MG PO TABS
10.0000 mg | ORAL_TABLET | Freq: Two times a day (BID) | ORAL | Status: DC
  Administered 2016-11-29 – 2016-12-02 (×6): 10 mg via ORAL
  Filled 2016-11-29 (×6): qty 1

## 2016-11-29 NOTE — ED Notes (Signed)
Patient transported to CT 

## 2016-11-29 NOTE — ED Triage Notes (Addendum)
Patient reports 2 weeks history of severe abdominal pain, generalized in nature, described as cramping that causes severe pain. Reports bloating feeling in abdomen. Patient has seen PCP and is being treated for IBS. Starting taking bentyl on Friday, no relief.Patient reports she has had diarrhea and now constipation. Decreased appetite but denies n/v.

## 2016-11-29 NOTE — H&P (Signed)
History and Physical    Mckaylee Dimalanta OZH:[id number] DOB: 09-Feb-1986 DOA: 11/29/2016  PCP: Doreene Nest, NP   Patient coming from: Home, by way of urgent care  Chief Complaint: Abdominal pain, diarrhea   HPI: Casey Pope is a 31 y.o. female with medical history significant for generalized anxiety disorder and chronic headaches, who presented to the emergency department for evaluation of severe abdominal pain with diarrhea. Patient reports that she had been in her usual state of health until approximately 2 weeks ago when she noted the insidious development of generalized abdominal pain. Over the last 2 weeks, pain has become much more severe, still generalized, but mainly on the right upper and lower quadrant. Pain is described as cramping, severe, waxing and waning but constant, associated with nausea and diarrhea, but no vomiting. She denies any melena or hematochezia. She has never experienced similar symptoms previously. There has not been any recent long distance travel or sick contacts. She was on clindamycin approximately 1 month ago surrounding a dental procedure. She reports some chills, but has not checked her temperature. Denies cough, dyspnea, chest pain, or headache. No family history of IBD. She saw her PCP for this on 11/27/2016, was prescribed Bentyl, but has continued to worsen. She went to an urgent care for evaluation today and was advised to go to the ED for CT scan.  ED Course: Upon arrival to the ED, patient is found to be afebrile, saturating well on room air, and with vitals otherwise stable. Chemistry panels notable for potassium of 3.3 and bicarbonate of 21. CBC is unremarkable. Urinalysis is unremarkable. CT the abdomen and pelvis was obtained and demonstrates pancolitis, most prominent in the cecum, ascending, and proximal transverse colon, with the appendix also involved, and suggestive of infectious or inflammatory bowel disease. Patient  was treated with a liter of normal saline, morphine, Zofran, Rocephin, and Flagyl in the ED. GI pathogen panel was ordered. General surgery was consulted by the ED physician and was asked to see the patient with regards to the appendiceal findings on imaging. Patient remained stable in the ED and will be observed on the medical/surgical unit for ongoing evaluation and management of severe abdominal pain with diarrhea and CT findings of pancolitis, concerning for infectious or inflammatory process.  Review of Systems:  All other systems reviewed and apart from HPI, are negative.  Past Medical History:  Diagnosis Date  . Asthma    as a child - rarely uses inhaler  . Lower back pain    ? herniated disc  . Plantar fasciitis     Past Surgical History:  Procedure Laterality Date  . CESAREAN SECTION    . CESAREAN SECTION WITH BILATERAL TUBAL LIGATION Bilateral 02/27/2014   Procedure: CESAREAN SECTION WITH BILATERAL TUBAL LIGATION;  Surgeon: Mitchel Honour, DO;  Location: [facility];  Service: Obstetrics;  Laterality: Bilateral;  Repeat  edc 03/06/14  . CHOLECYSTECTOMY    . WISDOM TOOTH EXTRACTION       reports that she has never smoked. She has never used smokeless tobacco. She reports that she drinks alcohol. She reports that she does not use drugs.  Allergies  Allergen Reactions  . Sulfa Antibiotics Hives  . Ciprofloxacin   . Amoxicillin Rash  . Nickel Rash    Family History  Problem Relation Age of Onset  . Hypertension Mother   . Diabetes Mother   . Arthritis Father   . Cancer Maternal Grandfather  lung     Prior to Admission medications   Medication Sig Start Date End Date Taking? Authorizing Provider  adapalene (DIFFERIN) 0.1 % gel Apply topically at bedtime. 02/06/16   Doreene Nestlark, Katherine K, NP  busPIRone (BUSPAR) 10 MG tablet TAKE 1 TABLET BY MOUTH TWICE A DAY 05/07/16   Doreene Nestlark, Katherine K, NP  dicyclomine (BENTYL) 10 MG capsule Take 1 capsule (10 mg total) by mouth 4 (four)  times daily -  before meals and at bedtime. 11/27/16   Doreene Nestlark, Katherine K, NP  escitalopram (LEXAPRO) 5 MG tablet TAKE 1 TABLET BY MOUTH EVERY DAY 11/04/16   Doreene Nestlark, Katherine K, NP  hydrOXYzine (ATARAX/VISTARIL) 25 MG tablet Take 1 tablet (25 mg total) by mouth 2 (two) times daily as needed for anxiety. 02/06/16   Doreene Nestlark, Katherine K, NP  SUMAtriptan (IMITREX) 50 MG tablet Take 1 tablet by mouth at onset of migraine. May repeat in 2 hours if no resolve. Do not exceed 2 tablets in 24 hours. 02/06/16   Doreene Nestlark, Katherine K, NP  topiramate (TOPAMAX) 50 MG tablet TAKE 1 TABLET BY MOUTH TWICE A DAY 11/04/16   Doreene Nestlark, Katherine K, NP    Physical Exam: Vitals:   11/29/16 1715 11/29/16 1730 11/29/16 1754 11/29/16 1847  BP: (!) 89/73 102/76 102/63 101/65  Pulse: 86 79 73 74  Resp:   17 16  Temp:      TempSrc:      SpO2: 100% 100% 100% 99%  Weight:      Height:          Constitutional: NAD, calm, appears uncomfortable.  Eyes: PERTLA, lids and conjunctivae normal ENMT: Mucous membranes are moist. Posterior pharynx clear of any exudate or lesions.   Neck: normal, supple, no masses, no thyromegaly Respiratory: clear to auscultation bilaterally, no wheezing, no crackles. Normal respiratory effort.   Cardiovascular: S1 & S2 heard, regular rate and rhythm. No extremity edema. No significant JVD. Abdomen: No distension, soft. Tender in epigastrium and right mid and lower abd without rebound pain or guarding. Bowel sounds active. Musculoskeletal: no clubbing / cyanosis. No joint deformity upper and lower extremities.    Skin: no significant rashes, lesions, ulcers. Warm, dry, well-perfused. Neurologic: CN 2-12 grossly intact. Sensation intact, DTR normal. Strength 5/5 in all 4 limbs.  Psychiatric: Alert and oriented x 3. Calm, cooperative.     Labs on Admission: I have personally reviewed following labs and imaging studies  CBC:  Recent Labs Lab 11/27/16 1523 11/29/16 1441  WBC 7.4 8.3  NEUTROABS 4.4   --   HGB 13.8 14.7  HCT 42.0 44.2  MCV 90.1 88.6  PLT 268.0 247   Basic Metabolic Panel:  Recent Labs Lab 11/27/16 1523 11/29/16 1441  NA 136 138  K 3.3* 3.3*  CL 106 108  CO2 24 21*  GLUCOSE 85 86  BUN 6 <5*  CREATININE 0.77 0.74  CALCIUM 9.0 8.4*   GFR: Estimated Creatinine Clearance: 120.8 mL/min (by C-G formula based on SCr of 0.74 mg/dL). Liver Function Tests:  Recent Labs Lab 11/27/16 1523 11/29/16 1441  AST 15 16  ALT 17 20  ALKPHOS 54 53  BILITOT 0.3 0.4  PROT 6.5 6.5  ALBUMIN 3.9 3.6    Recent Labs Lab 11/29/16 1441  LIPASE 26   No results for input(s): AMMONIA in the last 168 hours. Coagulation Profile: No results for input(s): INR, PROTIME in the last 168 hours. Cardiac Enzymes: No results for input(s): CKTOTAL, CKMB, CKMBINDEX, TROPONINI in the last  168 hours. BNP (last 3 results) No results for input(s): PROBNP in the last 8760 hours. HbA1C: No results for input(s): HGBA1C in the last 72 hours. CBG: No results for input(s): GLUCAP in the last 168 hours. Lipid Profile: No results for input(s): CHOL, HDL, LDLCALC, TRIG, CHOLHDL, LDLDIRECT in the last 72 hours. Thyroid Function Tests: No results for input(s): TSH, T4TOTAL, FREET4, T3FREE, THYROIDAB in the last 72 hours. Anemia Panel: No results for input(s): VITAMINB12, FOLATE, FERRITIN, TIBC, IRON, RETICCTPCT in the last 72 hours. Urine analysis:    Component Value Date/Time   COLORURINE YELLOW 11/29/2016 1454   APPEARANCEUR HAZY (A) 11/29/2016 1454   LABSPEC 1.024 11/29/2016 1454   PHURINE 5.0 11/29/2016 1454   GLUCOSEU NEGATIVE 11/29/2016 1454   HGBUR NEGATIVE 11/29/2016 1454   BILIRUBINUR NEGATIVE 11/29/2016 1454   BILIRUBINUR negative 04/30/2015 0841   KETONESUR 20 (A) 11/29/2016 1454   PROTEINUR NEGATIVE 11/29/2016 1454   UROBILINOGEN 0.2 04/30/2015 0841   NITRITE NEGATIVE 11/29/2016 1454   LEUKOCYTESUR TRACE (A) 11/29/2016 1454   Sepsis  Labs: @LABRCNTIP (procalcitonin:4,lacticidven:4) )No results found for this or any previous visit (from the past 240 hour(s)).   Radiological Exams on Admission: Ct Abdomen Pelvis W Contrast  Result Date: 11/29/2016 CLINICAL DATA:  Abdominal pain. Right-sided pain for 2 weeks, increased over the past 2 days. Fever. EXAM: CT ABDOMEN AND PELVIS WITH CONTRAST TECHNIQUE: Multidetector CT imaging of the abdomen and pelvis was performed using the standard protocol following bolus administration of intravenous contrast. CONTRAST:  ISOVUE-300 IOPAMIDOL (ISOVUE-300) INJECTION 61% COMPARISON:  None. FINDINGS: Lower chest: The lung bases are clear. Hepatobiliary: No focal liver abnormality is seen. Status post cholecystectomy. No biliary dilatation. Pancreas: No ductal dilatation or inflammation. Spleen: Normal in size without focal abnormality. Small splenules anteriorly. Adrenals/Urinary Tract: Normal adrenal glands. No hydronephrosis or perinephric edema. Urinary bladder is only minimally distended, no bladder wall thickening. Stomach/Bowel: Diffuse colonic wall thickening, most prominent involving the cecum and ascending colon with associated pericolonic edema and soft tissue stranding. Descending and sigmoid colon are involved to a lesser extent. The appendix contains minimal fluid and is thick walled, similar in degree to the colonic inflammatory change. No definite terminal ileal or small bowel inflammation. The stomach is nondistended. Vascular/Lymphatic: Prominent ileal colic lymph nodes. No retroperitoneal adenopathy. No pelvic sidewall adenopathy. No acute vascular abnormality. Portal and mesenteric veins are patent. Normal caliber abdominal aorta. Reproductive: Uterus and bilateral adnexa are unremarkable. Other: Small amount of free fluid in the pelvis and right pericolic gutter. No free air or perforation. No intra-abdominal abscess. Musculoskeletal: There are no acute or suspicious osseous  abnormalities. Multiple bone islands scattered throughout the bony pelvis. IMPRESSION: 1. Colitis affecting the entire colon, most prominent involving the cecum, ascending and proximal transverse colon. Findings may be infectious or inflammatory bowel disease. No definite terminal ileal or small bowel inflammation. 2. Appendiceal inflammation is similar to that of the adjacent cecum and likely secondary to the underlying diffuse colonic process rather than primary appendicitis. Electronically Signed   By: Rubye Oaks M.D.   On: 11/29/2016 18:51    EKG: Not performed.   Assessment/Plan  1. Colitis   - Pt presents with 2 wks of abdominal pain, mainly on right side, and non-bloody diarrhea - She had been treated with clindamycin for dental infection 1 month ago - Afebrile and no leukocytosis  - CT abd/pelvis demonstrates a pancolitis  - She was given 1 liter NS, Rocephin, Flagyl, and analgesia in ED  -  Surgery was consulted by EDP with regard to inflamed appendix on CT, will follow-up on recommendations  - Plan to continue empiric abx with Rocephin and Flagyl, follow GI panel and C diff assay, maintain enteric precautions, and continue supportive care with IVF hydration, analgesia   2. Hypokalemia  - Serum potassium is 3.3 on admission in setting of diarrhea  - Treated with 30 mEq oral potassium  - Repeat chemistry panel in am   3. Anxiety  - Appears stable  - Continue Lexapro, Buspar, prn Vistaril   4. Chronic headaches  - Continue prophylaxis with Topomax    DVT prophylaxis: sq Lovenox Code Status: Full  Family Communication: Discussed with patient Disposition Plan: Observe on med-surg Consults called: General surgery Admission status: Observation    Briscoe Deutscher, MD [facility] Pager [phone]  If 7PM-7AM, please contact night-coverage [email or web address] Password [facility]  11/29/2016, 7:28 PM

## 2016-11-29 NOTE — ED Triage Notes (Signed)
Pt. Stated, Casey Pope had stomach pain for over 2 weeks, ?went to see Mayra ReelKate clark, NP on Friday and I was given Doxycycline and Im still having problem. [facility] me here for a CT scan.

## 2016-11-29 NOTE — ED Notes (Signed)
Sent label to main lab to add on hcg.

## 2016-11-29 NOTE — ED Notes (Signed)
ED Provider at bedside. 

## 2016-11-29 NOTE — ED Notes (Signed)
Rn to hold on giving morphine until Bp increases

## 2016-11-29 NOTE — ED Notes (Signed)
Patient discharged to ED.

## 2016-11-29 NOTE — ED Notes (Signed)
Report attempted x 1

## 2016-11-29 NOTE — ED Provider Notes (Signed)
[facility]   [id number] 11/29/16 Arrival Time: 1231   SUBJECTIVE:  Casey MapleHilary Suzanne Bunch Johnson is a 31 y.o. female who presents to the urgent care with complaint of abdominal pain ongoing for 2 weeks. She was seen by her primary care provider August 31, had a CMP, CBC drawn and started treatment for irritable bowel syndrome on Bentyl 4 times a day. She's had no relief, and continued worsening in symptoms. She has no appetite, reports she has not had anything to eat today, reports 5 bouts of diarrhea this morning. Denies history of foreign travel, or suspicious food intake, did have dental work done near the beginning of August, and reports taking clindamycin. Denies possibility of pregnancy, stating her "tubes are tied" denies any other symptoms     Past Medical History:  Diagnosis Date  . Asthma    as a child - rarely uses inhaler  . Lower back pain    ? herniated disc  . Plantar fasciitis    Social History   Social History  . Marital status: Married    Spouse name: N/A  . Number of children: N/A  . Years of education: N/A   Occupational History  . Not on file.   Social History Main Topics  . Smoking status: Never Smoker  . Smokeless tobacco: Never Used  . Alcohol use 0.0 oz/week     Comment: socially but none with pregnancy  . Drug use: No  . Sexual activity: Yes    Birth control/ protection: None   Other Topics Concern  . Not on file   Social History Narrative   Married.   Has 3 three children.   Completed Masters degree, works as a Runner, broadcasting/film/videoteacher.   Enjoys spending time with her family.      No outpatient prescriptions have been marked as taking for the 11/29/16 encounter [facility](Hospital Encounter).   Allergies  Allergen Reactions  . Sulfa Antibiotics Hives  . Ciprofloxacin   . Amoxicillin Rash  . Nickel Rash      ROS: As per HPI, remainder of ROS negative.   OBJECTIVE:  Vitals:   11/29/16 1331  BP: 91/63  Pulse: 89  Resp: 18  Temp: 98.8 F  (37.1 C)  TempSrc: Oral  SpO2: 100%       General Appearance:  awake, alert, oriented, in no acute distress, well developed, well nourished and obese Skin:  skin color, texture, turgor are normal Back:  no pain to palpation Lungs:  Normal expansion.  Clear to auscultation.  No rales, rhonchi, or wheezing. Abdomen:  Striae noticed on visual inspection, bowel sounds present and active, she has tenderness right upper quadrant, and epigastric, also exhibits rebound tenderness and guarding. Extremities: Extremities warm to touch, pink, with no edema. Peripheral Pulses:  Normal Psych exam:alert,oriented, in NAD with a full range of affect, normal behavior and no psychotic features  Labs:  Results for orders placed or performed in visit on 11/27/16  CBC with Differential/Platelet  Result Value Ref Range   WBC 7.4 4.0 - 10.5 K/uL   RBC 4.67 3.87 - 5.11 Mil/uL   Hemoglobin 13.8 12.0 - 15.0 g/dL   HCT 40.942.0 81.136.0 - 91.446.0 %   MCV 90.1 78.0 - 100.0 fl   MCHC 32.9 30.0 - 36.0 g/dL   RDW 78.213.5 95.611.5 - 21.315.5 %   Platelets 268.0 150.0 - 400.0 K/uL   Neutrophils Relative % 58.9 43.0 - 77.0 %   Lymphocytes Relative 31.2 12.0 - 46.0 %  Monocytes Relative 6.6 3.0 - 12.0 %   Eosinophils Relative 2.6 0.0 - 5.0 %   Basophils Relative 0.7 0.0 - 3.0 %   Neutro Abs 4.4 1.4 - 7.7 K/uL   Lymphs Abs 2.3 0.7 - 4.0 K/uL   Monocytes Absolute 0.5 0.1 - 1.0 K/uL   Eosinophils Absolute 0.2 0.0 - 0.7 K/uL   Basophils Absolute 0.1 0.0 - 0.1 K/uL  Comprehensive metabolic panel  Result Value Ref Range   Sodium 136 135 - 145 mEq/L   Potassium 3.3 (L) 3.5 - 5.1 mEq/L   Chloride 106 96 - 112 mEq/L   CO2 24 19 - 32 mEq/L   Glucose, Bld 85 70 - 99 mg/dL   BUN 6 6 - 23 mg/dL   Creatinine, Ser 1.61 0.40 - 1.20 mg/dL   Total Bilirubin 0.3 0.2 - 1.2 mg/dL   Alkaline Phosphatase 54 39 - 117 U/L   AST 15 0 - 37 U/L   ALT 17 0 - 35 U/L   Total Protein 6.5 6.0 - 8.3 g/dL   Albumin 3.9 3.5 - 5.2 g/dL   Calcium 9.0 8.4  - 09.6 mg/dL   GFR 04.54 >09.81 mL/min    Labs Reviewed - No data to display  No results found.     ASSESSMENT & PLAN:  1. Generalized abdominal pain    Based on signs, symptoms, physical exam findings, we referred to the emergency room for further treatment and evaluation.  Reviewed expectations re: course of current medical issues. Questions answered. Outlined signs and symptoms indicating need for more acute intervention. Patient verbalized understanding. After Visit Summary given.    Procedures:        Dorena Bodo, NP 11/29/16 1357

## 2016-11-29 NOTE — Discharge Instructions (Signed)
Based on your signs, symptoms, physical exam findings, you are exhibiting rebound tenderness, no guarding, you'll need to have further imaging done beyond what we have the capability of doing here. I recommend going to the emergency room for further evaluation

## 2016-11-29 NOTE — ED Provider Notes (Signed)
[facility] Provider Note   CSN: [id number] Arrival date & time: 11/29/16  1403     History   Chief Complaint Chief Complaint  Patient presents with  . Abdominal Pain    HPI Dorris Pierre Reathel Turi is a 31 y.o. female.  HPI  31 year old female presents today complaining of abdominal pain for the past 2 weeks. Is worsened over the past couple days. Seen by her primary care doctor August 31 and prescribed Bentyl. The symptoms have worsened since she has started that. She has nausea and a poor appetite. She has frequent diarrhea. Today she felt tinnitus and need to stool was unable to with then began having loose stools again. She was on clindamycin and early August. She has had some chills but no fever. She denies any urinary tract infection symptoms, or increased vaginal discharge. Last menstrual period was mid August was normal. She has had a bilateral tubal ligation.  Past Medical History:  Diagnosis Date  . Asthma    as a child - rarely uses inhaler  . Lower back pain    ? herniated disc  . Plantar fasciitis     Patient Active Problem List   Diagnosis Date Noted  . Acute non-recurrent sinusitis 12/02/2015  . Generalized anxiety disorder 08/02/2014  . Low back pain 08/02/2014  . Frequent headaches 08/02/2014  . Morbid obesity with BMI of 40.0-44.9, adult (HCC) 08/02/2014  . S/P cesarean section 02/27/2014    Past Surgical History:  Procedure Laterality Date  . CESAREAN SECTION    . CESAREAN SECTION WITH BILATERAL TUBAL LIGATION Bilateral 02/27/2014   Procedure: CESAREAN SECTION WITH BILATERAL TUBAL LIGATION;  Surgeon: Mitchel Honour, DO;  Location: [facility];  Service: Obstetrics;  Laterality: Bilateral;  Repeat  edc 03/06/14  . CHOLECYSTECTOMY    . WISDOM TOOTH EXTRACTION      OB History    Gravida Para Term Preterm AB Living   2 2 2  0 0 2   SAB TAB Ectopic Multiple Live Births   0 0 0 0 2       Home Medications    Prior to Admission medications     Medication Sig Start Date End Date Taking? Authorizing Provider  adapalene (DIFFERIN) 0.1 % gel Apply topically at bedtime. 02/06/16   Doreene Nest, NP  busPIRone (BUSPAR) 10 MG tablet TAKE 1 TABLET BY MOUTH TWICE A DAY 05/07/16   Doreene Nest, NP  dicyclomine (BENTYL) 10 MG capsule Take 1 capsule (10 mg total) by mouth 4 (four) times daily -  before meals and at bedtime. 11/27/16   Doreene Nest, NP  escitalopram (LEXAPRO) 5 MG tablet TAKE 1 TABLET BY MOUTH EVERY DAY 11/04/16   Doreene Nest, NP  hydrOXYzine (ATARAX/VISTARIL) 25 MG tablet Take 1 tablet (25 mg total) by mouth 2 (two) times daily as needed for anxiety. 02/06/16   Doreene Nest, NP  SUMAtriptan (IMITREX) 50 MG tablet Take 1 tablet by mouth at onset of migraine. May repeat in 2 hours if no resolve. Do not exceed 2 tablets in 24 hours. 02/06/16   Doreene Nest, NP  topiramate (TOPAMAX) 50 MG tablet TAKE 1 TABLET BY MOUTH TWICE A DAY 11/04/16   Doreene Nest, NP    Family History Family History  Problem Relation Age of Onset  . Hypertension Mother   . Diabetes Mother   . Arthritis Father   . Cancer Maternal Grandfather        lung  Social History Social History  Substance Use Topics  . Smoking status: Never Smoker  . Smokeless tobacco: Never Used  . Alcohol use 0.0 oz/week     Comment: socially but none with pregnancy     Allergies   Sulfa antibiotics; Ciprofloxacin; Amoxicillin; and Nickel   Review of Systems Review of Systems  All other systems reviewed and are negative.    Physical Exam Updated Vital Signs BP 101/65   Pulse 74   Temp 99.3 F (37.4 C) (Oral)   Resp 16   Ht 1.626 m (5\' 4" )   Wt 105.7 kg (233 lb)   LMP 11/13/2016   SpO2 99%   BMI 39.99 kg/m   Physical Exam  Constitutional: She is oriented to person, place, and time. She appears well-developed and well-nourished.  HENT:  Head: Normocephalic and atraumatic.  Right Ear: External ear normal.  Left Ear:  External ear normal.  Nose: Nose normal.  Mouth/Throat: Oropharynx is clear and moist.  Eyes: Pupils are equal, round, and reactive to light. EOM are normal.  Neck: Normal range of motion. Neck supple.  Cardiovascular: Normal rate, regular rhythm and normal heart sounds.   Pulmonary/Chest: Effort normal and breath sounds normal.  Abdominal: Soft. Bowel sounds are normal. She exhibits no mass. There is tenderness. There is no rebound and no guarding.  Moderate diffuse tenderness to palpation of masses or rebound are noted  Musculoskeletal: Normal range of motion.  Neurological: She is alert and oriented to person, place, and time.  Skin: Skin is warm and dry. Capillary refill takes less than 2 seconds.  Psychiatric: She has a normal mood and affect. Her behavior is normal. Judgment and thought content normal.  Nursing note and vitals reviewed.    ED Treatments / Results  Labs (all labs ordered are listed, but only abnormal results are displayed) Labs Reviewed  COMPREHENSIVE METABOLIC PANEL - Abnormal; Notable for the following:       Result Value   Potassium 3.3 (*)    CO2 21 (*)    BUN <5 (*)    Calcium 8.4 (*)    All other components within normal limits  URINALYSIS, ROUTINE W REFLEX MICROSCOPIC - Abnormal; Notable for the following:    APPearance HAZY (*)    Ketones, ur 20 (*)    Leukocytes, UA TRACE (*)    Bacteria, UA RARE (*)    Squamous Epithelial / LPF 6-30 (*)    All other components within normal limits  I-STAT BETA HCG BLOOD, ED ([facility], [facility], [facility] ONLY) - Abnormal; Notable for the following:    I-stat hCG, quantitative 5.2 (*)    All other components within normal limits  LIPASE, BLOOD  CBC  HCG, QUANTITATIVE, PREGNANCY    EKG  EKG Interpretation None       Radiology Ct Abdomen Pelvis W Contrast  Result Date: 11/29/2016 CLINICAL DATA:  Abdominal pain. Right-sided pain for 2 weeks, increased over the past 2 days. Fever. EXAM: CT ABDOMEN AND PELVIS WITH CONTRAST  TECHNIQUE: Multidetector CT imaging of the abdomen and pelvis was performed using the standard protocol following bolus administration of intravenous contrast. CONTRAST:  ISOVUE-300 IOPAMIDOL (ISOVUE-300) INJECTION 61% COMPARISON:  None. FINDINGS: Lower chest: The lung bases are clear. Hepatobiliary: No focal liver abnormality is seen. Status post cholecystectomy. No biliary dilatation. Pancreas: No ductal dilatation or inflammation. Spleen: Normal in size without focal abnormality. Small splenules anteriorly. Adrenals/Urinary Tract: Normal adrenal glands. No hydronephrosis or perinephric edema. Urinary bladder is only  minimally distended, no bladder wall thickening. Stomach/Bowel: Diffuse colonic wall thickening, most prominent involving the cecum and ascending colon with associated pericolonic edema and soft tissue stranding. Descending and sigmoid colon are involved to a lesser extent. The appendix contains minimal fluid and is thick walled, similar in degree to the colonic inflammatory change. No definite terminal ileal or small bowel inflammation. The stomach is nondistended. Vascular/Lymphatic: Prominent ileal colic lymph nodes. No retroperitoneal adenopathy. No pelvic sidewall adenopathy. No acute vascular abnormality. Portal and mesenteric veins are patent. Normal caliber abdominal aorta. Reproductive: Uterus and bilateral adnexa are unremarkable. Other: Small amount of free fluid in the pelvis and right pericolic gutter. No free air or perforation. No intra-abdominal abscess. Musculoskeletal: There are no acute or suspicious osseous abnormalities. Multiple bone islands scattered throughout the bony pelvis. IMPRESSION: 1. Colitis affecting the entire colon, most prominent involving the cecum, ascending and proximal transverse colon. Findings may be infectious or inflammatory bowel disease. No definite terminal ileal or small bowel inflammation. 2. Appendiceal inflammation is similar to that of the  adjacent cecum and likely secondary to the underlying diffuse colonic process rather than primary appendicitis. Electronically Signed   By: Rubye OaksMelanie  Ehinger M.D.   On: 11/29/2016 18:51    Procedures Procedures (including critical care time)  Medications Ordered in ED Medications  morphine 4 MG/ML injection 2 mg (2 mg Intravenous Not Given 11/29/16 1716)  cefTRIAXone (ROCEPHIN) 2 g in dextrose 5 % 50 mL IVPB (not administered)    And  metroNIDAZOLE (FLAGYL) IVPB 500 mg (not administered)  sodium chloride 0.9 % bolus 1,000 mL (0 mLs Intravenous Stopped 11/29/16 1845)  ondansetron (ZOFRAN) injection 4 mg (4 mg Intravenous Given 11/29/16 1721)  iopamidol (ISOVUE-300) 61 % injection (100 mLs Intravenous Contrast Given 11/29/16 1811)     Initial Impression / Assessment and Plan / ED Course  I have reviewed the triage vital signs and the nursing notes.  Pertinent labs & imaging results that were available during my care of the patient were reviewed by me and considered in my medical decision making (see chart for details).     CT results reviewed and discussed with patient. Rocephin and Flagyl ordered (patient allergic to Cipro) care discussed with Dr. Sheliah HatchKinsinger suture on for general surgery. He will see and evaluate. Plan admission to hospitalist for colitis Discussed with Dr.Opyd 1- colitis with appendicitis- likely not primary appendicitis, general surgery consulted.   Final Clinical Impressions(s) / ED Diagnoses   Final diagnoses:  Colitis  Disease of appendix    New Prescriptions New Prescriptions   No medications on file     Margarita Grizzleay, [place] Wanner, MD 11/29/16 1932

## 2016-11-30 DIAGNOSIS — Z79899 Other long term (current) drug therapy: Secondary | ICD-10-CM | POA: Diagnosis not present

## 2016-11-30 DIAGNOSIS — F411 Generalized anxiety disorder: Secondary | ICD-10-CM | POA: Diagnosis not present

## 2016-11-30 DIAGNOSIS — Z880 Allergy status to penicillin: Secondary | ICD-10-CM | POA: Diagnosis not present

## 2016-11-30 DIAGNOSIS — J45909 Unspecified asthma, uncomplicated: Secondary | ICD-10-CM | POA: Diagnosis present

## 2016-11-30 DIAGNOSIS — A0472 Enterocolitis due to Clostridium difficile, not specified as recurrent: Secondary | ICD-10-CM | POA: Diagnosis not present

## 2016-11-30 DIAGNOSIS — Z91048 Other nonmedicinal substance allergy status: Secondary | ICD-10-CM | POA: Diagnosis not present

## 2016-11-30 DIAGNOSIS — R51 Headache: Secondary | ICD-10-CM | POA: Diagnosis not present

## 2016-11-30 DIAGNOSIS — Z881 Allergy status to other antibiotic agents status: Secondary | ICD-10-CM | POA: Diagnosis not present

## 2016-11-30 DIAGNOSIS — Z9049 Acquired absence of other specified parts of digestive tract: Secondary | ICD-10-CM | POA: Diagnosis not present

## 2016-11-30 DIAGNOSIS — E876 Hypokalemia: Secondary | ICD-10-CM | POA: Diagnosis not present

## 2016-11-30 DIAGNOSIS — K529 Noninfective gastroenteritis and colitis, unspecified: Secondary | ICD-10-CM | POA: Diagnosis not present

## 2016-11-30 LAB — BASIC METABOLIC PANEL
Anion gap: 7 (ref 5–15)
BUN: <5 mg/dL — ABNORMAL LOW (ref 6–20)
CHLORIDE: 114 mmol/L — AB (ref 101–111)
CO2: 19 mmol/L — AB (ref 22–32)
CREATININE: 0.62 mg/dL (ref 0.44–1.00)
Calcium: 7.6 mg/dL — ABNORMAL LOW (ref 8.9–10.3)
GFR calc non Af Amer: >60 mL/min (ref >60)
Glucose, Bld: 86 mg/dL (ref 65–99)
POTASSIUM: 3.6 mmol/L (ref 3.5–5.1)
Sodium: 140 mmol/L (ref 135–145)

## 2016-11-30 LAB — CBC
HCT: 39.3 % (ref 36.0–46.0)
HEMOGLOBIN: 12.6 g/dL (ref 12.0–15.0)
MCH: 28.6 pg (ref 26.0–34.0)
MCHC: 32.1 g/dL (ref 30.0–36.0)
MCV: 89.1 fL (ref 78.0–100.0)
Platelets: 230 10*3/uL (ref 150–400)
RBC: 4.41 MIL/uL (ref 3.87–5.11)
RDW: 13.6 % (ref 11.5–15.5)
WBC: 7.2 10*3/uL (ref 4.0–10.5)

## 2016-11-30 LAB — C DIFFICILE QUICK SCREEN W PCR REFLEX
C DIFFICILE (CDIFF) TOXIN: POSITIVE — AB
C Diff antigen: POSITIVE — AB
C Diff interpretation: DETECTED

## 2016-11-30 LAB — GASTROINTESTINAL PANEL BY PCR, STOOL (REPLACES STOOL CULTURE)

## 2016-11-30 MED ORDER — POTASSIUM CHLORIDE IN NACL 20-0.9 MEQ/L-% IV SOLN
INTRAVENOUS | Status: DC
  Administered 2016-11-30 – 2016-12-01 (×3): via INTRAVENOUS
  Filled 2016-11-30 (×4): qty 1000

## 2016-11-30 MED ORDER — VANCOMYCIN 50 MG/ML ORAL SOLUTION
125.0000 mg | Freq: Four times a day (QID) | ORAL | Status: DC
  Administered 2016-11-30 – 2016-12-02 (×9): 125 mg via ORAL
  Filled 2016-11-30 (×10): qty 2.5

## 2016-11-30 NOTE — Progress Notes (Signed)
Patient received from ED. Patient oriented x4. Patient able to make needs known,denies pain at this time. Patient oriented to room and informed the plan of care.

## 2016-11-30 NOTE — Progress Notes (Signed)
PROGRESS NOTE    Casey Pope  ZOX:[id number] DOB: 1985-11-15 DOA: 11/29/2016 PCP: Doreene Nest, NP   Outpatient Specialists:     Brief Narrative:   Casey Pope Jamal Haskin is a 31 y.o. female with medical history significant for generalized anxiety disorder and chronic headaches, who presented to the emergency department for evaluation of severe abdominal pain with diarrhea. Patient reports that she had been in her usual state of health until approximately 2 weeks ago when she noted the insidious development of generalized abdominal pain. Over the last 2 weeks, pain has become much more severe, still generalized, but mainly on the right upper and lower quadrant. Pain is described as cramping, severe, waxing and waning but constant, associated with nausea and diarrhea, but no vomiting. She denies any melena or hematochezia. She has never experienced similar symptoms previously. There has not been any recent long distance travel or sick contacts. She was on clindamycin approximately 1 month ago surrounding a dental procedure. She reports some chills, but has not checked her temperature. Denies cough, dyspnea, chest pain, or headache. No family history of IBD. She saw her PCP for this on 11/27/2016, was prescribed Bentyl, but has continued to worsen. She went to an urgent care for evaluation today and was advised to go to the ED for CT scan.   Assessment & Plan:   Principal Problem:   Colitis Active Problems:   Generalized anxiety disorder   Frequent headaches   Hypokalemia   c diff Colitis   - She had been treated with clindamycin for dental infection 1 month ago - CT abd/pelvis demonstrates a pancolitis  - continue IV flagyl while taking limited PO due to nausea -PO vanc x 14 days -hand hygiene discussed with family -doubt appendicitis- appreciate surgery consult -outpatient GI follow up -continue IVF while still with liquid stools  Hypokalemia  -  repleted  Anxiety  - Appears stable  - Continue Lexapro, Buspar, prn Vistaril   Chronic headaches  - Continue prophylaxis with Topomax     DVT prophylaxis:  Lovenox   Code Status: Full Code   Family Communication:   Disposition Plan:     Consultants:   surgery  Procedures:     Antimicrobials:      Subjective: Still having cramping after eating  Objective: Vitals:   11/29/16 2120 11/30/16 0650 11/30/16 0702 11/30/16 0734  BP: (!) 97/59 (!) 77/38 119/78 (!) 101/54  Pulse: 77 74 73 71  Resp: 17 17    Temp: 98.2 F (36.8 C) 97.7 F (36.5 C)    TempSrc: Oral Oral    SpO2: 100% 100%    Weight: 105.4 kg (232 lb 4.8 oz)     Height: 5\' 4"  (1.626 m)       Intake/Output Summary (Last 24 hours) at 11/30/16 0825 Last data filed at 11/30/16 0651  Gross per 24 hour  Intake             1170 ml  Output                0 ml  Net             1170 ml   Filed Weights   11/29/16 1438 11/29/16 2120  Weight: 105.7 kg (233 lb) 105.4 kg (232 lb 4.8 oz)    Examination:  General exam: Appears calm and comfortable  Respiratory system: Clear to auscultation. Respiratory effort normal. Cardiovascular system: S1 & S2 heard, RRR. No JVD, murmurs, rubs,  gallops or clicks. No pedal edema. Gastrointestinal system: Abdomen is nondistended, soft and nontender. No organomegaly or masses felt. Normal bowel sounds heard. Central nervous system: Alert and oriented. No focal neurological deficits. Extremities: Symmetric 5 x 5 power. Skin: No rashes, lesions or ulcers Psychiatry: Judgement and insight appear normal. Mood & affect appropriate.     Data Reviewed: I have personally reviewed following labs and imaging studies  CBC:  Recent Labs Lab 11/27/16 1523 11/29/16 1441 11/30/16 0358  WBC 7.4 8.3 7.2  NEUTROABS 4.4  --   --   HGB 13.8 14.7 12.6  HCT 42.0 44.2 39.3  MCV 90.1 88.6 89.1  PLT 268.0 247 230   Basic Metabolic Panel:  Recent Labs Lab  11/27/16 1523 11/29/16 1441 11/30/16 0358  NA 136 138 140  K 3.3* 3.3* 3.6  CL 106 108 114*  CO2 24 21* 19*  GLUCOSE 85 86 86  BUN 6 <5* <5*  CREATININE 0.77 0.74 0.62  CALCIUM 9.0 8.4* 7.6*   GFR: Estimated Creatinine Clearance: 120.6 mL/min (by C-G formula based on SCr of 0.62 mg/dL). Liver Function Tests:  Recent Labs Lab 11/27/16 1523 11/29/16 1441  AST 15 16  ALT 17 20  ALKPHOS 54 53  BILITOT 0.3 0.4  PROT 6.5 6.5  ALBUMIN 3.9 3.6    Recent Labs Lab 11/29/16 1441  LIPASE 26   No results for input(s): AMMONIA in the last 168 hours. Coagulation Profile: No results for input(s): INR, PROTIME in the last 168 hours. Cardiac Enzymes: No results for input(s): CKTOTAL, CKMB, CKMBINDEX, TROPONINI in the last 168 hours. BNP (last 3 results) No results for input(s): PROBNP in the last 8760 hours. HbA1C: No results for input(s): HGBA1C in the last 72 hours. CBG: No results for input(s): GLUCAP in the last 168 hours. Lipid Profile: No results for input(s): CHOL, HDL, LDLCALC, TRIG, CHOLHDL, LDLDIRECT in the last 72 hours. Thyroid Function Tests: No results for input(s): TSH, T4TOTAL, FREET4, T3FREE, THYROIDAB in the last 72 hours. Anemia Panel: No results for input(s): VITAMINB12, FOLATE, FERRITIN, TIBC, IRON, RETICCTPCT in the last 72 hours. Urine analysis:    Component Value Date/Time   COLORURINE YELLOW 11/29/2016 1454   APPEARANCEUR HAZY (A) 11/29/2016 1454   LABSPEC 1.024 11/29/2016 1454   PHURINE 5.0 11/29/2016 1454   GLUCOSEU NEGATIVE 11/29/2016 1454   HGBUR NEGATIVE 11/29/2016 1454   BILIRUBINUR NEGATIVE 11/29/2016 1454   BILIRUBINUR negative 04/30/2015 0841   KETONESUR 20 (A) 11/29/2016 1454   PROTEINUR NEGATIVE 11/29/2016 1454   UROBILINOGEN 0.2 04/30/2015 0841   NITRITE NEGATIVE 11/29/2016 1454   LEUKOCYTESUR TRACE (A) 11/29/2016 1454     ) Recent Results (from the past 240 hour(s))  C difficile quick scan w PCR reflex     Status: Abnormal    Collection Time: 11/29/16 11:11 PM  Result Value Ref Range Status   C Diff antigen POSITIVE (A) NEGATIVE Final   C Diff toxin POSITIVE (A) NEGATIVE Final   C Diff interpretation Toxin producing C. difficile detected.  Final    Comment: CRITICAL RESULT CALLED TO, READ BACK BY AND VERIFIED WITH: S.BLACKWELL RN AT 0757 11/30/16 BY A.DAVIS       Anti-infectives    Start     Dose/Rate Route Frequency Ordered Stop   11/30/16 1200  cefTRIAXone (ROCEPHIN) 2 g in dextrose 5 % 50 mL IVPB     2 g 100 mL/hr over 30 Minutes Intravenous Every 24 hours 11/29/16 2002     11/30/16  1000  vancomycin (VANCOCIN) 50 mg/mL oral solution 125 mg     125 mg Oral 4 times daily 11/30/16 0759 12/10/16 0959   11/30/16 0600  metroNIDAZOLE (FLAGYL) IVPB 500 mg     500 mg 100 mL/hr over 60 Minutes Intravenous Every 8 hours 11/29/16 2002     11/29/16 1915  cefTRIAXone (ROCEPHIN) 2 g in dextrose 5 % 50 mL IVPB     2 g 100 mL/hr over 30 Minutes Intravenous  Once 11/29/16 1900 11/29/16 1952   11/29/16 1915  metroNIDAZOLE (FLAGYL) IVPB 500 mg     500 mg 100 mL/hr over 60 Minutes Intravenous  Once 11/29/16 1900 11/29/16 2101       Radiology Studies: Ct Abdomen Pelvis W Contrast  Result Date: 11/29/2016 CLINICAL DATA:  Abdominal pain. Right-sided pain for 2 weeks, increased over the past 2 days. Fever. EXAM: CT ABDOMEN AND PELVIS WITH CONTRAST TECHNIQUE: Multidetector CT imaging of the abdomen and pelvis was performed using the standard protocol following bolus administration of intravenous contrast. CONTRAST:  100mL ISOVUE-300 IOPAMIDOL (ISOVUE-300) INJECTION 61% COMPARISON:  None. FINDINGS: Lower chest: The lung bases are clear. Hepatobiliary: No focal liver abnormality is seen. Status post cholecystectomy. No biliary dilatation. Pancreas: No ductal dilatation or inflammation. Spleen: Normal in size without focal abnormality. Small splenules anteriorly. Adrenals/Urinary Tract: Normal adrenal glands. No hydronephrosis or  perinephric edema. Urinary bladder is only minimally distended, no bladder wall thickening. Stomach/Bowel: Diffuse colonic wall thickening, most prominent involving the cecum and ascending colon with associated pericolonic edema and soft tissue stranding. Descending and sigmoid colon are involved to a lesser extent. The appendix contains minimal fluid and is thick walled, similar in degree to the colonic inflammatory change. No definite terminal ileal or small bowel inflammation. The stomach is nondistended. Vascular/Lymphatic: Prominent ileal colic lymph nodes. No retroperitoneal adenopathy. No pelvic sidewall adenopathy. No acute vascular abnormality. Portal and mesenteric veins are patent. Normal caliber abdominal aorta. Reproductive: Uterus and bilateral adnexa are unremarkable. Other: Small amount of free fluid in the pelvis and right pericolic gutter. No free air or perforation. No intra-abdominal abscess. Musculoskeletal: There are no acute or suspicious osseous abnormalities. Multiple bone islands scattered throughout the bony pelvis. IMPRESSION: 1. Colitis affecting the entire colon, most prominent involving the cecum, ascending and proximal transverse colon. Findings may be infectious or inflammatory bowel disease. No definite terminal ileal or small bowel inflammation. 2. Appendiceal inflammation is similar to that of the adjacent cecum and likely secondary to the underlying diffuse colonic process rather than primary appendicitis. Electronically Signed   By: Rubye OaksMelanie  Ehinger M.D.   On: 11/29/2016 18:51        Scheduled Meds: . busPIRone  10 mg Oral BID  . enoxaparin (LOVENOX) injection  40 mg Subcutaneous Q24H  . escitalopram  5 mg Oral Daily  .  morphine injection  2 mg Intravenous Once  . topiramate  50 mg Oral BID  . vancomycin  125 mg Oral QID   Continuous Infusions: . cefTRIAXone (ROCEPHIN)  IV    . metronidazole Stopped (11/30/16 [id number])     LOS: 0 days    Time spent: 35  min    JESSICA U VANN, DO [facility] Pager [phone]  If 7PM-7AM, please contact night-coverage [email or web address] Password [email or web address] 11/30/2016, 8:25 AM

## 2016-11-30 NOTE — Consult Note (Signed)
Reason for Consult: abdominal pain Referring Physician: Tristan Schroeder Casey Pope is an 31 y.o. female.  HPI: 31 yo female with 2 weeks of abdominal pain. The pain is mostly on the right side. It has been slowly increasing intensity over the last week. She has nausea but no vomiting. She has been having diarrhea 8 times a day for the last week. It is often mucousy, no blood. She saw her doctor 2 days ago and was started on dicyclomine for concern for IBS.  Past Medical History:  Diagnosis Date  . Asthma    as a child - rarely uses inhaler  . Lower back pain    ? herniated disc  . Plantar fasciitis     Past Surgical History:  Procedure Laterality Date  . CESAREAN SECTION    . CESAREAN SECTION WITH BILATERAL TUBAL LIGATION Bilateral 02/27/2014   Procedure: CESAREAN SECTION WITH BILATERAL TUBAL LIGATION;  Surgeon: Linda Hedges, DO;  Location: [facility];  Service: Obstetrics;  Laterality: Bilateral;  Repeat  edc 03/06/14  . CHOLECYSTECTOMY    . WISDOM TOOTH EXTRACTION      Family History  Problem Relation Age of Onset  . Hypertension Mother   . Diabetes Mother   . Arthritis Father   . Cancer Maternal Grandfather        lung  . Inflammatory bowel disease Neg Hx     Social History:  reports that she has never smoked. She has never used smokeless tobacco. She reports that she drinks alcohol. She reports that she does not use drugs.  Allergies:  Allergies  Allergen Reactions  . Sulfa Antibiotics Hives  . Ciprofloxacin   . Amoxicillin Rash  . Nickel Rash    Medications: I have reviewed the patient's current medications.  Results for orders placed or performed during the hospital encounter of 11/29/16 (from the past 48 hour(s))  Lipase, blood     Status: None   Collection Time: 11/29/16  2:41 PM  Result Value Ref Range   Lipase 26 11 - 51 U/L  Comprehensive metabolic panel     Status: Abnormal   Collection Time: 11/29/16  2:41 PM  Result Value Ref Range   Sodium  138 135 - 145 mmol/L   Potassium 3.3 (L) 3.5 - 5.1 mmol/L   Chloride 108 101 - 111 mmol/L   CO2 21 (L) 22 - 32 mmol/L   Glucose, Bld 86 65 - 99 mg/dL   BUN <5 (L) 6 - 20 mg/dL   Creatinine, Ser 0.74 0.44 - 1.00 mg/dL   Calcium 8.4 (L) 8.9 - 10.3 mg/dL   Total Protein 6.5 6.5 - 8.1 g/dL   Albumin 3.6 3.5 - 5.0 g/dL   AST 16 15 - 41 U/L   ALT 20 14 - 54 U/L   Alkaline Phosphatase 53 38 - 126 U/L   Total Bilirubin 0.4 0.3 - 1.2 mg/dL   GFR calc non Af Amer >60 >60 mL/min   GFR calc Af Amer >60 >60 mL/min    Comment: (NOTE) The eGFR has been calculated using the CKD EPI equation. This calculation has not been validated in all clinical situations. eGFR's persistently <60 mL/min signify possible Chronic Kidney Disease.    Anion gap 9 5 - 15  CBC     Status: None   Collection Time: 11/29/16  2:41 PM  Result Value Ref Range   WBC 8.3 4.0 - 10.5 K/uL   RBC 4.99 3.87 - 5.11 MIL/uL  Hemoglobin 14.7 12.0 - 15.0 g/dL   HCT 44.2 36.0 - 46.0 %   MCV 88.6 78.0 - 100.0 fL   MCH 29.5 26.0 - 34.0 pg   MCHC 33.3 30.0 - 36.0 g/dL   RDW 13.3 11.5 - 15.5 %   Platelets 247 150 - 400 K/uL  hCG, quantitative, pregnancy     Status: None   Collection Time: 11/29/16  2:41 PM  Result Value Ref Range   hCG, Beta Chain, Quant, S <1 <5 mIU/mL    Comment:          GEST. AGE      CONC.  (mIU/mL)   <=1 WEEK        5 - 50     2 WEEKS       50 - 500     3 WEEKS       100 - 10,000     4 WEEKS     1,000 - 30,000     5 WEEKS     3,500 - 115,000   6-8 WEEKS     12,000 - 270,000    12 WEEKS     15,000 - 220,000        FEMALE AND NON-PREGNANT FEMALE:     LESS THAN 5 mIU/mL   Urinalysis, Routine w reflex microscopic     Status: Abnormal   Collection Time: 11/29/16  2:54 PM  Result Value Ref Range   Color, Urine YELLOW YELLOW   APPearance HAZY (A) CLEAR   Specific Gravity, Urine 1.024 1.005 - 1.030   pH 5.0 5.0 - 8.0   Glucose, UA NEGATIVE NEGATIVE mg/dL   Hgb urine dipstick NEGATIVE NEGATIVE    Bilirubin Urine NEGATIVE NEGATIVE   Ketones, ur 20 (A) NEGATIVE mg/dL   Protein, ur NEGATIVE NEGATIVE mg/dL   Nitrite NEGATIVE NEGATIVE   Leukocytes, UA TRACE (A) NEGATIVE   RBC / HPF 0-5 0 - 5 RBC/hpf   WBC, UA 0-5 0 - 5 WBC/hpf   Bacteria, UA RARE (A) NONE SEEN   Squamous Epithelial / LPF 6-30 (A) NONE SEEN   Mucus PRESENT   I-Stat Beta hCG blood, ED ([facility], [facility], [facility] only)     Status: Abnormal   Collection Time: 11/29/16  5:22 PM  Result Value Ref Range   I-stat hCG, quantitative 5.2 (H) <5 mIU/mL   Comment 3            Comment:   GEST. AGE      CONC.  (mIU/mL)   <=1 WEEK        5 - 50     2 WEEKS       50 - 500     3 WEEKS       100 - 10,000     4 WEEKS     1,000 - 30,000        FEMALE AND NON-PREGNANT FEMALE:     LESS THAN 5 mIU/mL   C-reactive protein     Status: None   Collection Time: 11/29/16 10:03 PM  Result Value Ref Range   CRP <0.8 <1.0 mg/dL  Sedimentation rate     Status: None   Collection Time: 11/29/16 10:03 PM  Result Value Ref Range   Sed Rate 3 0 - 22 mm/hr  Basic metabolic panel     Status: Abnormal   Collection Time: 11/30/16  3:58 AM  Result Value Ref Range   Sodium 140 135 - 145 mmol/L   Potassium  3.6 3.5 - 5.1 mmol/L   Chloride 114 (H) 101 - 111 mmol/L   CO2 19 (L) 22 - 32 mmol/L   Glucose, Bld 86 65 - 99 mg/dL   BUN <5 (L) 6 - 20 mg/dL   Creatinine, Ser 0.62 0.44 - 1.00 mg/dL   Calcium 7.6 (L) 8.9 - 10.3 mg/dL   GFR calc non Af Amer >60 >60 mL/min   GFR calc Af Amer >60 >60 mL/min    Comment: (NOTE) The eGFR has been calculated using the CKD EPI equation. This calculation has not been validated in all clinical situations. eGFR's persistently <60 mL/min signify possible Chronic Kidney Disease.    Anion gap 7 5 - 15  CBC     Status: None   Collection Time: 11/30/16  3:58 AM  Result Value Ref Range   WBC 7.2 4.0 - 10.5 K/uL   RBC 4.41 3.87 - 5.11 MIL/uL   Hemoglobin 12.6 12.0 - 15.0 g/dL   HCT 39.3 36.0 - 46.0 %   MCV 89.1 78.0 - 100.0 fL    MCH 28.6 26.0 - 34.0 pg   MCHC 32.1 30.0 - 36.0 g/dL   RDW 13.6 11.5 - 15.5 %   Platelets 230 150 - 400 K/uL    Ct Abdomen Pelvis W Contrast  Result Date: 11/29/2016 CLINICAL DATA:  Abdominal pain. Right-sided pain for 2 weeks, increased over the past 2 days. Fever. EXAM: CT ABDOMEN AND PELVIS WITH CONTRAST TECHNIQUE: Multidetector CT imaging of the abdomen and pelvis was performed using the standard protocol following bolus administration of intravenous contrast. CONTRAST:  141m ISOVUE-300 IOPAMIDOL (ISOVUE-300) INJECTION 61% COMPARISON:  None. FINDINGS: Lower chest: The lung bases are clear. Hepatobiliary: No focal liver abnormality is seen. Status post cholecystectomy. No biliary dilatation. Pancreas: No ductal dilatation or inflammation. Spleen: Normal in size without focal abnormality. Small splenules anteriorly. Adrenals/Urinary Tract: Normal adrenal glands. No hydronephrosis or perinephric edema. Urinary bladder is only minimally distended, no bladder wall thickening. Stomach/Bowel: Diffuse colonic wall thickening, most prominent involving the cecum and ascending colon with associated pericolonic edema and soft tissue stranding. Descending and sigmoid colon are involved to a lesser extent. The appendix contains minimal fluid and is thick walled, similar in degree to the colonic inflammatory change. No definite terminal ileal or small bowel inflammation. The stomach is nondistended. Vascular/Lymphatic: Prominent ileal colic lymph nodes. No retroperitoneal adenopathy. No pelvic sidewall adenopathy. No acute vascular abnormality. Portal and mesenteric veins are patent. Normal caliber abdominal aorta. Reproductive: Uterus and bilateral adnexa are unremarkable. Other: Small amount of free fluid in the pelvis and right pericolic gutter. No free air or perforation. No intra-abdominal abscess. Musculoskeletal: There are no acute or suspicious osseous abnormalities. Multiple bone islands scattered throughout  the bony pelvis. IMPRESSION: 1. Colitis affecting the entire colon, most prominent involving the cecum, ascending and proximal transverse colon. Findings may be infectious or inflammatory bowel disease. No definite terminal ileal or small bowel inflammation. 2. Appendiceal inflammation is similar to that of the adjacent cecum and likely secondary to the underlying diffuse colonic process rather than primary appendicitis. Electronically Signed   By: MJeb LeveringM.D.   On: 11/29/2016 18:51    Review of Systems  Constitutional: Negative for chills and fever.  HENT: Negative for hearing loss.   Eyes: Negative for blurred vision and double vision.  Respiratory: Negative for cough and hemoptysis.   Cardiovascular: Negative for chest pain and palpitations.  Gastrointestinal: Positive for abdominal pain, diarrhea and nausea. Negative for  vomiting.  Genitourinary: Negative for dysuria and urgency.  Musculoskeletal: Negative for myalgias and neck pain.  Skin: Negative for itching and rash.  Neurological: Negative for dizziness, tingling and headaches.  Endo/Heme/Allergies: Does not bruise/bleed easily.  Psychiatric/Behavioral: Negative for depression and suicidal ideas.   Blood pressure 119/78, pulse 73, temperature 97.7 F (36.5 C), temperature source Oral, resp. rate 17, height 5' 4" (1.626 m), weight 105.4 kg (232 lb 4.8 oz), last menstrual period 11/13/2016, SpO2 100 %. Physical Exam  Vitals reviewed. Constitutional: She is oriented to person, place, and time. She appears well-developed and well-nourished.  HENT:  Head: Normocephalic and atraumatic.  Eyes: Pupils are equal, round, and reactive to light. Conjunctivae and EOM are normal.  Neck: Normal range of motion. Neck supple.  Cardiovascular: Normal rate and regular rhythm.   Respiratory: Effort normal and breath sounds normal.  GI: Soft. Bowel sounds are normal. She exhibits no distension. There is tenderness in the right upper  quadrant and right lower quadrant.  Musculoskeletal: Normal range of motion.  Neurological: She is alert and oriented to person, place, and time.  Skin: Skin is warm and dry.  Psychiatric: She has a normal mood and affect. Her behavior is normal.    Assessment/Plan: 31 yo female with 2 week history of abdominal pain and diarrhea. CT scan has slight dilation of appendix as well as inflamed colon. Her story is not consistent with acute appendicitis. I agree with the antibiotics. I think she would benefit from GI evaluation. Colitis: infectious vs inflammatory  Arta Bruce Kinsinger 11/30/2016, 7:28 AM

## 2016-11-30 NOTE — Final Consult Note (Signed)
[facility] Progress Note     Subjective: CC: diarrhea Patient having loose stools, generalized cramping abdominal pain. Tolerating fulls. Took clindamycin for a dental procedure about a month ago. UOP good. VSS.   Objective: Vital signs in last 24 hours: Temp:  [97.7 F (36.5 C)-99.3 F (37.4 C)] 97.7 F (36.5 C) (09/03 0650) Pulse Rate:  [69-89] 71 (09/03 0734) Resp:  [16-18] 17 (09/03 0650) BP: (77-119)/(38-78) 101/54 (09/03 0734) SpO2:  [95 %-100 %] 100 % (09/03 0650) Weight:  [105.4 kg (232 lb 4.8 oz)-105.7 kg (233 lb)] 105.4 kg (232 lb 4.8 oz) (09/02 2120) Last BM Date: 11/30/16  Intake/Output from previous day: 09/02 0701 - 09/03 0700 In: 1170 [P.O.:120; IV Piggyback:1050] Out: -  Intake/Output this shift: No intake/output data recorded.  PE: Gen:  Alert, NAD, pleasant Card:  Regular rate and rhythm, pedal pulses 2+ BL Pulm:  Normal effort, clear to auscultation bilaterally Abd: Soft, generalized tenderness, non-distended, bowel sounds present in all 4 quadrants, no HSM Skin: warm and dry, no rashes  Psych: A&Ox3   Lab Results:   Recent Labs  11/29/16 1441 11/30/16 0358  WBC 8.3 7.2  HGB 14.7 12.6  HCT 44.2 39.3  PLT 247 230   BMET  Recent Labs  11/29/16 1441 11/30/16 0358  NA 138 140  K 3.3* 3.6  CL 108 114*  CO2 21* 19*  GLUCOSE 86 86  BUN <5* <5*  CREATININE 0.74 0.62  CALCIUM 8.4* 7.6*   PT/INR No results for input(s): LABPROT, INR in the last 72 hours. CMP     Component Value Date/Time   NA 140 11/30/2016 0358   K 3.6 11/30/2016 0358   CL 114 (H) 11/30/2016 0358   CO2 19 (L) 11/30/2016 0358   GLUCOSE 86 11/30/2016 0358   BUN <5 (L) 11/30/2016 0358   CREATININE 0.62 11/30/2016 0358   CALCIUM 7.6 (L) 11/30/2016 0358   PROT 6.5 11/29/2016 1441   ALBUMIN 3.6 11/29/2016 1441   AST 16 11/29/2016 1441   ALT 20 11/29/2016 1441   ALKPHOS 53 11/29/2016 1441   BILITOT 0.4 11/29/2016 1441   GFRNONAA >60 11/30/2016 0358    GFRAA >60 11/30/2016 0358   Lipase     Component Value Date/Time   LIPASE 26 11/29/2016 1441       Studies/Results: Ct Abdomen Pelvis W Contrast  Result Date: 11/29/2016 CLINICAL DATA:  Abdominal pain. Right-sided pain for 2 weeks, increased over the past 2 days. Fever. EXAM: CT ABDOMEN AND PELVIS WITH CONTRAST TECHNIQUE: Multidetector CT imaging of the abdomen and pelvis was performed using the standard protocol following bolus administration of intravenous contrast. CONTRAST:  ISOVUE-300 IOPAMIDOL (ISOVUE-300) INJECTION 61% COMPARISON:  None. FINDINGS: Lower chest: The lung bases are clear. Hepatobiliary: No focal liver abnormality is seen. Status post cholecystectomy. No biliary dilatation. Pancreas: No ductal dilatation or inflammation. Spleen: Normal in size without focal abnormality. Small splenules anteriorly. Adrenals/Urinary Tract: Normal adrenal glands. No hydronephrosis or perinephric edema. Urinary bladder is only minimally distended, no bladder wall thickening. Stomach/Bowel: Diffuse colonic wall thickening, most prominent involving the cecum and ascending colon with associated pericolonic edema and soft tissue stranding. Descending and sigmoid colon are involved to a lesser extent. The appendix contains minimal fluid and is thick walled, similar in degree to the colonic inflammatory change. No definite terminal ileal or small bowel inflammation. The stomach is nondistended. Vascular/Lymphatic: Prominent ileal colic lymph nodes. No retroperitoneal adenopathy. No pelvic sidewall adenopathy. No acute vascular abnormality. Portal  and mesenteric veins are patent. Normal caliber abdominal aorta. Reproductive: Uterus and bilateral adnexa are unremarkable. Other: Small amount of free fluid in the pelvis and right pericolic gutter. No free air or perforation. No intra-abdominal abscess. Musculoskeletal: There are no acute or suspicious osseous abnormalities. Multiple bone islands scattered  throughout the bony pelvis. IMPRESSION: 1. Colitis affecting the entire colon, most prominent involving the cecum, ascending and proximal transverse colon. Findings may be infectious or inflammatory bowel disease. No definite terminal ileal or small bowel inflammation. 2. Appendiceal inflammation is similar to that of the adjacent cecum and likely secondary to the underlying diffuse colonic process rather than primary appendicitis. Electronically Signed   By: Rubye OaksMelanie  Ehinger M.D.   On: 11/29/2016 18:51    Anti-infectives: Anti-infectives    Start     Dose/Rate Route Frequency Ordered Stop   11/30/16 1200  cefTRIAXone (ROCEPHIN) 2 g in dextrose 5 % 50 mL IVPB  Status:  Discontinued     2 g 100 mL/hr over 30 Minutes Intravenous Every 24 hours 11/29/16 2002 11/30/16 0826   11/30/16 1000  vancomycin (VANCOCIN) 50 mg/mL oral solution 125 mg     125 mg Oral 4 times daily 11/30/16 0759 12/10/16 0959   11/30/16 0600  metroNIDAZOLE (FLAGYL) IVPB 500 mg     500 mg 100 mL/hr over 60 Minutes Intravenous Every 8 hours 11/29/16 2002     11/29/16 1915  cefTRIAXone (ROCEPHIN) 2 g in dextrose 5 % 50 mL IVPB     2 g 100 mL/hr over 30 Minutes Intravenous  Once 11/29/16 1900 11/29/16 1952   11/29/16 1915  metroNIDAZOLE (FLAGYL) IVPB 500 mg     500 mg 100 mL/hr over 60 Minutes Intravenous  Once 11/29/16 1900 11/29/16 2101       Assessment/Plan C.Diff Colitis - hx not consistent with appendicitis - no other indications for acute surgical intervention - positive for C.Diff last night - WBC 7.2, afebrile  FEN - full liquids, IVF hydration VTE- SCDs, lovenox ID - IV flagyl (9/2>>), Rocephin (9/2), PO vanc (9/3>>)   Plan: We will sign off at this point. Call with questions/concerns.    LOS: 0 days    Wells GuilesKelly Rayburn , [facility] 11/30/2016, 9:49 AM Pager: [phone] Consults: [phone] Mon-Fri 7:00 am-4:30 pm Sat-Sun 7:00 am-11:30 am

## 2016-11-30 NOTE — Progress Notes (Signed)
Report called from Lab. Pt is positive CDiff. MD notified.

## 2016-12-01 ENCOUNTER — Telehealth: Payer: Self-pay

## 2016-12-01 ENCOUNTER — Encounter: Payer: Self-pay | Admitting: Primary Care

## 2016-12-01 DIAGNOSIS — A0472 Enterocolitis due to Clostridium difficile, not specified as recurrent: Secondary | ICD-10-CM

## 2016-12-01 HISTORY — DX: Enterocolitis due to Clostridium difficile, not specified as recurrent: A04.72

## 2016-12-01 LAB — HIV ANTIBODY (ROUTINE TESTING W REFLEX): HIV Screen 4th Generation wRfx: NONREACTIVE

## 2016-12-01 NOTE — Telephone Encounter (Signed)
Noted, chart reviewed. Will see patient for hospital follow up once discharged.

## 2016-12-01 NOTE — Telephone Encounter (Signed)
Patient seen in ER and admitted on 11/29/16    Patient Name: Casey CostainHILARY JOHNSON Gender: Female DOB: 01/02/1986 Age: [age] Y 8 M 1 D Return Phone Number: [phone] (Primary) City/State/Zip: [place] Client [facility] - Night Physician Vernona Riegerlark, Katherine - NP Who Is Calling Patient / Member / Family / Caregiver Call Type Triage / Clinical Relationship To Patient Self Return Phone Number [phone] (Primary) Chief Complaint ABDOMINAL PAIN - Severe and only in abdomen Reason for Call Symptomatic / Request for Health Information Initial Comment Caller states that she was diagnosed wit IBS, is now in severe pain. Nurse Assessment Nurse: Annye Englisharmon, RN, Denise Date/Time (Eastern Time): 11/29/2016 12:01:07 PM Confirm and document reason for call. If symptomatic, describe symptoms. ---Caller states that she was diagnosed wit IBS, is now in severe pain. Does the PT have any chronic conditions? (i.e. diabetes, asthma, etc.) ---Yes List chronic conditions. ---IBS, Anxiety Is the patient pregnant or possibly pregnant? (Ask all females between the ages of 7512-55) ---No Guidelines Guideline Title Affirmed Question Abdominal Pain - Female [1] MILD-MODERATE pain AND [2] constant AND [3] present > 2 hours Disp. Time Lamount Cohen(Eastern Time) Disposition Final User 11/29/2016 12:06:34 PM See Physician within 4 Hours (or PCP triage) Yes Carmon, RN, Denise Referrals [facility] Care Advice Given Per Guideline SEE PHYSICIAN WITHIN 4 HOURS (or PCP triage): REST: Lie down and rest until seen. NOTHING BY MOUTH: Do not eat or drink anything for now. CALL BACK IF: * You become worse. CARE ADVICE given per Abdominal Pain, Female (Adult) guideline.

## 2016-12-01 NOTE — Progress Notes (Signed)
PROGRESS NOTE    Casey Pope  ZOX:[id number] DOB: 29-Oct-1985 DOA: 11/29/2016 PCP: Doreene Nest, NP   Outpatient Specialists:     Brief Narrative:   Casey Pope [name] is a 31 y.o. female with medical history significant for generalized anxiety disorder and chronic headaches, who presented to the emergency department for evaluation of severe abdominal pain with diarrhea. Patient reports that she had been in her usual state of health until approximately 2 weeks ago when she noted the insidious development of generalized abdominal pain. Over the last 2 weeks, pain has become much more severe, still generalized, but mainly on the right upper and lower quadrant. Pain is described as cramping, severe, waxing and waning but constant, associated with nausea and diarrhea, but no vomiting. She denies any melena or hematochezia. She has never experienced similar symptoms previously. There has not been any recent long distance travel or sick contacts. She was on clindamycin approximately 1 month ago surrounding a dental procedure. She reports some chills, but has not checked her temperature. Denies cough, dyspnea, chest pain, or headache. No family history of IBD. She saw her PCP for this on 11/27/2016, was prescribed Bentyl, but has continued to worsen. She went to an urgent care for evaluation today and was advised to go to the ED for CT scan.   Assessment & Plan:   Principal Problem:   Colitis Active Problems:   Generalized anxiety disorder   Frequent headaches   Hypokalemia   c diff Colitis   - She had been treated with clindamycin for dental infection 1 month ago - CT abd/pelvis demonstrates a pancolitis  - continue IV flagyl while in hospital -PO vanc x 14 days -hand hygiene discussed with family -doubt appendicitis- appreciate surgery consult -outpatient GI follow up -continue IVF while still with liquid stools  Hypokalemia  - repleted -recheck in  AM  Anxiety  - Appears stable  - Continue Lexapro, Buspar, prn Vistaril   Chronic headaches  - Continue prophylaxis with Topomax     DVT prophylaxis:  Lovenox   Code Status: Full Code   Family Communication: At bedside 9/3  Disposition Plan:     Consultants:   surgery  Procedures:     Antimicrobials:      Subjective: Still with liquid stools but less episodes  Objective: Vitals:   11/30/16 0734 11/30/16 1338 12/01/16 0528 12/01/16 0550  BP: (!) 101/54 (!) 99/56 (!) 88/52 92/61  Pulse: 71 74 75   Resp:  18 17   Temp:  97.9 F (36.6 C) 97.7 F (36.5 C)   TempSrc:  Oral Oral   SpO2:  100% 99%   Weight:      Height:        Intake/Output Summary (Last 24 hours) at 12/01/16 0810 Last data filed at 12/01/16 0630  Gross per 24 hour  Intake             1920 ml  Output                0 ml  Net             1920 ml   Filed Weights   11/29/16 1438 11/29/16 2120  Weight: 105.7 kg (233 lb) 105.4 kg (232 lb 4.8 oz)    Examination:  General exam: NAD, sleeping Respiratory system: clear, no wheezing Cardiovascular system: rrr Gastrointestinal system: +BS, soft, NT Central nervous system: A+Ox2 Extremities: moves all 4 ext Psychiatry: normal affect  Data Reviewed: I have personally reviewed following labs and imaging studies  CBC:  Recent Labs Lab 11/27/16 1523 11/29/16 1441 11/30/16 0358  WBC 7.4 8.3 7.2  NEUTROABS 4.4  --   --   HGB 13.8 14.7 12.6  HCT 42.0 44.2 39.3  MCV 90.1 88.6 89.1  PLT 268.0 247 230   Basic Metabolic Panel:  Recent Labs Lab 11/27/16 1523 11/29/16 1441 11/30/16 0358  NA 136 138 140  K 3.3* 3.3* 3.6  CL 106 108 114*  CO2 24 21* 19*  GLUCOSE 85 86 86  BUN 6 <5* <5*  CREATININE 0.77 0.74 0.62  CALCIUM 9.0 8.4* 7.6*   GFR: Estimated Creatinine Clearance: 120.6 mL/min (by C-G formula based on SCr of 0.62 mg/dL). Liver Function Tests:  Recent Labs Lab 11/27/16 1523 11/29/16 1441  AST 15 16   ALT 17 20  ALKPHOS 54 53  BILITOT 0.3 0.4  PROT 6.5 6.5  ALBUMIN 3.9 3.6    Recent Labs Lab 11/29/16 1441  LIPASE 26   No results for input(s): AMMONIA in the last 168 hours. Coagulation Profile: No results for input(s): INR, PROTIME in the last 168 hours. Cardiac Enzymes: No results for input(s): CKTOTAL, CKMB, CKMBINDEX, TROPONINI in the last 168 hours. BNP (last 3 results) No results for input(s): PROBNP in the last 8760 hours. HbA1C: No results for input(s): HGBA1C in the last 72 hours. CBG: No results for input(s): GLUCAP in the last 168 hours. Lipid Profile: No results for input(s): CHOL, HDL, LDLCALC, TRIG, CHOLHDL, LDLDIRECT in the last 72 hours. Thyroid Function Tests: No results for input(s): TSH, T4TOTAL, FREET4, T3FREE, THYROIDAB in the last 72 hours. Anemia Panel: No results for input(s): VITAMINB12, FOLATE, FERRITIN, TIBC, IRON, RETICCTPCT in the last 72 hours. Urine analysis:    Component Value Date/Time   COLORURINE YELLOW 11/29/2016 1454   APPEARANCEUR HAZY (A) 11/29/2016 1454   LABSPEC 1.024 11/29/2016 1454   PHURINE 5.0 11/29/2016 1454   GLUCOSEU NEGATIVE 11/29/2016 1454   HGBUR NEGATIVE 11/29/2016 1454   BILIRUBINUR NEGATIVE 11/29/2016 1454   BILIRUBINUR negative 04/30/2015 0841   KETONESUR 20 (A) 11/29/2016 1454   PROTEINUR NEGATIVE 11/29/2016 1454   UROBILINOGEN 0.2 04/30/2015 0841   NITRITE NEGATIVE 11/29/2016 1454   LEUKOCYTESUR TRACE (A) 11/29/2016 1454     ) Recent Results (from the past 240 hour(s))  Gastrointestinal Panel by PCR , Stool     Status: None   Collection Time: 11/29/16 11:11 PM  Result Value Ref Range Status   Campylobacter species NOT DETECTED NOT DETECTED Final   Plesimonas shigelloides NOT DETECTED NOT DETECTED Final   Salmonella species NOT DETECTED NOT DETECTED Final   Yersinia enterocolitica NOT DETECTED NOT DETECTED Final   Vibrio species NOT DETECTED NOT DETECTED Final   Vibrio cholerae NOT DETECTED NOT  DETECTED Final   Enteroaggregative E coli (EAEC) NOT DETECTED NOT DETECTED Final   Enteropathogenic E coli (EPEC) NOT DETECTED NOT DETECTED Final   Enterotoxigenic E coli (ETEC) NOT DETECTED NOT DETECTED Final   Shiga like toxin producing E coli (STEC) NOT DETECTED NOT DETECTED Final   Shigella/Enteroinvasive E coli (EIEC) NOT DETECTED NOT DETECTED Final   Cryptosporidium NOT DETECTED NOT DETECTED Final   Cyclospora cayetanensis NOT DETECTED NOT DETECTED Final   Entamoeba histolytica NOT DETECTED NOT DETECTED Final   Giardia lamblia NOT DETECTED NOT DETECTED Final   Adenovirus F40/41 NOT DETECTED NOT DETECTED Final   Astrovirus NOT DETECTED NOT DETECTED Final   Norovirus GI/GII NOT  DETECTED NOT DETECTED Final   Rotavirus A NOT DETECTED NOT DETECTED Final   Sapovirus (I, II, IV, and V) NOT DETECTED NOT DETECTED Final  C difficile quick scan w PCR reflex     Status: Abnormal   Collection Time: 11/29/16 11:11 PM  Result Value Ref Range Status   C Diff antigen POSITIVE (A) NEGATIVE Final   C Diff toxin POSITIVE (A) NEGATIVE Final   C Diff interpretation Toxin producing C. difficile detected.  Final    Comment: CRITICAL RESULT CALLED TO, READ BACK BY AND VERIFIED WITH: S.BLACKWELL RN AT 0757 11/30/16 BY A.DAVIS       Anti-infectives    Start     Dose/Rate Route Frequency Ordered Stop   11/30/16 1200  cefTRIAXone (ROCEPHIN) 2 g in dextrose 5 % 50 mL IVPB  Status:  Discontinued     2 g 100 mL/hr over 30 Minutes Intravenous Every 24 hours 11/29/16 2002 11/30/16 0826   11/30/16 1000  vancomycin (VANCOCIN) 50 mg/mL oral solution 125 mg     125 mg Oral 4 times daily 11/30/16 0759 12/10/16 0959   11/30/16 0600  metroNIDAZOLE (FLAGYL) IVPB 500 mg     500 mg 100 mL/hr over 60 Minutes Intravenous Every 8 hours 11/29/16 2002     11/29/16 1915  cefTRIAXone (ROCEPHIN) 2 g in dextrose 5 % 50 mL IVPB     2 g 100 mL/hr over 30 Minutes Intravenous  Once 11/29/16 1900 11/29/16 1952   11/29/16 1915   metroNIDAZOLE (FLAGYL) IVPB 500 mg     500 mg 100 mL/hr over 60 Minutes Intravenous  Once 11/29/16 1900 11/29/16 2101       Radiology Studies: Ct Abdomen Pelvis W Contrast  Result Date: 11/29/2016 CLINICAL DATA:  Abdominal pain. Right-sided pain for 2 weeks, increased over the past 2 days. Fever. EXAM: CT ABDOMEN AND PELVIS WITH CONTRAST TECHNIQUE: Multidetector CT imaging of the abdomen and pelvis was performed using the standard protocol following bolus administration of intravenous contrast. CONTRAST:  ISOVUE-300 IOPAMIDOL (ISOVUE-300) INJECTION 61% COMPARISON:  None. FINDINGS: Lower chest: The lung bases are clear. Hepatobiliary: No focal liver abnormality is seen. Status post cholecystectomy. No biliary dilatation. Pancreas: No ductal dilatation or inflammation. Spleen: Normal in size without focal abnormality. Small splenules anteriorly. Adrenals/Urinary Tract: Normal adrenal glands. No hydronephrosis or perinephric edema. Urinary bladder is only minimally distended, no bladder wall thickening. Stomach/Bowel: Diffuse colonic wall thickening, most prominent involving the cecum and ascending colon with associated pericolonic edema and soft tissue stranding. Descending and sigmoid colon are involved to a lesser extent. The appendix contains minimal fluid and is thick walled, similar in degree to the colonic inflammatory change. No definite terminal ileal or small bowel inflammation. The stomach is nondistended. Vascular/Lymphatic: Prominent ileal colic lymph nodes. No retroperitoneal adenopathy. No pelvic sidewall adenopathy. No acute vascular abnormality. Portal and mesenteric veins are patent. Normal caliber abdominal aorta. Reproductive: Uterus and bilateral adnexa are unremarkable. Other: Small amount of free fluid in the pelvis and right pericolic gutter. No free air or perforation. No intra-abdominal abscess. Musculoskeletal: There are no acute or suspicious osseous abnormalities. Multiple  bone islands scattered throughout the bony pelvis. IMPRESSION: 1. Colitis affecting the entire colon, most prominent involving the cecum, ascending and proximal transverse colon. Findings may be infectious or inflammatory bowel disease. No definite terminal ileal or small bowel inflammation. 2. Appendiceal inflammation is similar to that of the adjacent cecum and likely secondary to the underlying diffuse colonic process rather than  primary appendicitis. Electronically Signed   By: Rubye OaksMelanie  Ehinger M.D.   On: 11/29/2016 18:51        Scheduled Meds: . busPIRone  10 mg Oral BID  . enoxaparin (LOVENOX) injection  40 mg Subcutaneous Q24H  . escitalopram  5 mg Oral Daily  .  morphine injection  2 mg Intravenous Once  . topiramate  50 mg Oral BID  . vancomycin  125 mg Oral QID   Continuous Infusions: . 0.9 % NaCl with KCl 20 mEq / L 75 mL/hr at 12/01/16 0400  . metronidazole Stopped (12/01/16 0630)     LOS: 1 day    Time spent: 25 min    Trameka Dorough U Blaze Sandin, DO [facility] Pager [phone]  If 7PM-7AM, please contact night-coverage [email or web address] Password [email or web address] 12/01/2016, 8:10 AM

## 2016-12-02 LAB — BASIC METABOLIC PANEL
Anion gap: 3 — ABNORMAL LOW (ref 5–15)
BUN: 5 mg/dL — ABNORMAL LOW (ref 6–20)
CHLORIDE: 115 mmol/L — AB (ref 101–111)
CO2: 21 mmol/L — ABNORMAL LOW (ref 22–32)
CREATININE: 0.76 mg/dL (ref 0.44–1.00)
Calcium: 7.8 mg/dL — ABNORMAL LOW (ref 8.9–10.3)
GFR calc non Af Amer: >60 mL/min (ref >60)
Glucose, Bld: 91 mg/dL (ref 65–99)
POTASSIUM: 4.2 mmol/L (ref 3.5–5.1)
SODIUM: 139 mmol/L (ref 135–145)

## 2016-12-02 LAB — CBC
HCT: 37 % (ref 36.0–46.0)
HEMOGLOBIN: 11.8 g/dL — AB (ref 12.0–15.0)
MCH: 28.9 pg (ref 26.0–34.0)
MCHC: 31.9 g/dL (ref 30.0–36.0)
MCV: 90.7 fL (ref 78.0–100.0)
Platelets: 200 10*3/uL (ref 150–400)
RBC: 4.08 MIL/uL (ref 3.87–5.11)
RDW: 14.1 % (ref 11.5–15.5)
WBC: 5.4 10*3/uL (ref 4.0–10.5)

## 2016-12-02 MED ORDER — FLUCONAZOLE 150 MG PO TABS: 150.0000 mg | ORAL_TABLET | Freq: Once | ORAL | 0 refills | Status: AC

## 2016-12-02 MED ORDER — VANCOMYCIN HCL 125 MG PO CAPS: 125.0000 mg | ORAL_CAPSULE | Freq: Four times a day (QID) | ORAL | 0 refills | Status: DC

## 2016-12-02 NOTE — Discharge Summary (Signed)
Physician Discharge Summary  Casey HalimHilary Suzanne Earnest Casey Pope Casey Pope ZOX:[id number] DOB: 05/27/1985 DOA: 11/29/2016  PCP: Doreene Nestlark, Katherine K, NP  Admit date: 11/29/2016 Discharge date: 12/02/2016   Recommendations for Outpatient Follow-Up:   1. Finish PO vancomycin prescription, consider GI referral if still having issues   Discharge Diagnosis:   Principal Problem:   Colitis Active Problems:   Generalized anxiety disorder   Frequent headaches   Hypokalemia   C. difficile colitis   Discharge disposition:  Home.   Discharge Condition: Improved.  Diet recommendation: regular  Wound care: None.   History of Present Illness:   Casey Casey Pope is a 31 y.o. female with medical history significant for generalized anxiety disorder and chronic headaches, who presented to the emergency department for evaluation of severe abdominal pain with diarrhea. Patient reports that she had been in her usual state of health until approximately 2 weeks ago when she noted the insidious development of generalized abdominal pain. Over the last 2 weeks, pain has become much more severe, still generalized, but mainly on the right upper and lower quadrant. Pain is described as cramping, severe, waxing and waning but constant, associated with nausea and diarrhea, but no vomiting. She denies any melena or hematochezia. She has never experienced similar symptoms previously. There has not been any recent long distance travel or sick contacts. She was on clindamycin approximately 1 month ago surrounding a dental procedure. She reports some chills, but has not checked her temperature. Denies cough, dyspnea, chest pain, or headache. No family history of IBD. She saw her PCP for this on 11/27/2016, was prescribed Bentyl, but has continued to worsen. She went to an urgent care for evaluation today and was advised to go to the ED for CT scan.   Hospital Course by Problem:   c diff Colitis  - She had been treated  with clindamycin for dental infection 1 month ago - CT abd/pelvis demonstrates a pancolitis  -PO vanc x 14 days -hand hygiene discussed with family -doubt appendicitis- appreciate surgery consult -outpatient GI follow up   Hypokalemia  - repleted  Anxiety  - Appears stable  - Continue Lexapro, Buspar, prn Vistaril   Chronic headaches  - Continue prophylaxis with Topomax      Medical Consultants:    surgery   Discharge Exam:   Vitals:   12/01/16 2037 12/02/16 0543  BP: 105/70 100/60  Pulse: 97 89  Resp: 19 17  Temp: 98.2 F (36.8 C) 98.4 F (36.9 C)  SpO2: 99% 98%   Vitals:   12/01/16 0550 12/01/16 1345 12/01/16 2037 12/02/16 0543  BP: 92/61 107/73 105/70 100/60  Pulse:  86 97 89  Resp:  18 19 17   Temp:  98.1 F (36.7 C) 98.2 F (36.8 C) 98.4 F (36.9 C)  TempSrc:  Oral Oral Oral  SpO2:  99% 99% 98%  Weight:      Height:        Gen:  NAD   The results of significant diagnostics from this hospitalization (including imaging, microbiology, ancillary and laboratory) are listed below for reference.     Procedures and Diagnostic Studies:   Ct Abdomen Pelvis W Contrast  Result Date: 11/29/2016 CLINICAL DATA:  Abdominal pain. Right-sided pain for 2 weeks, increased over the past 2 days. Fever. EXAM: CT ABDOMEN AND PELVIS WITH CONTRAST TECHNIQUE: Multidetector CT imaging of the abdomen and pelvis was performed using the standard protocol following bolus administration of intravenous contrast. CONTRAST:  100mL ISOVUE-300 IOPAMIDOL (ISOVUE-300) INJECTION  61% COMPARISON:  None. FINDINGS: Lower chest: The lung bases are clear. Hepatobiliary: No focal liver abnormality is seen. Status post cholecystectomy. No biliary dilatation. Pancreas: No ductal dilatation or inflammation. Spleen: Normal in size without focal abnormality. Small splenules anteriorly. Adrenals/Urinary Tract: Normal adrenal glands. No hydronephrosis or perinephric edema. Urinary bladder is only  minimally distended, no bladder wall thickening. Stomach/Bowel: Diffuse colonic wall thickening, most prominent involving the cecum and ascending colon with associated pericolonic edema and soft tissue stranding. Descending and sigmoid colon are involved to a lesser extent. The appendix contains minimal fluid and is thick walled, similar in degree to the colonic inflammatory change. No definite terminal ileal or small bowel inflammation. The stomach is nondistended. Vascular/Lymphatic: Prominent ileal colic lymph nodes. No retroperitoneal adenopathy. No pelvic sidewall adenopathy. No acute vascular abnormality. Portal and mesenteric veins are patent. Normal caliber abdominal aorta. Reproductive: Uterus and bilateral adnexa are unremarkable. Other: Small amount of free fluid in the pelvis and right pericolic gutter. No free air or perforation. No intra-abdominal abscess. Musculoskeletal: There are no acute or suspicious osseous abnormalities. Multiple bone islands scattered throughout the bony pelvis. IMPRESSION: 1. Colitis affecting the entire colon, most prominent involving the cecum, ascending and proximal transverse colon. Findings may be infectious or inflammatory bowel disease. No definite terminal ileal or small bowel inflammation. 2. Appendiceal inflammation is similar to that of the adjacent cecum and likely secondary to the underlying diffuse colonic process rather than primary appendicitis. Electronically Signed   By: Rubye Oaks M.D.   On: 11/29/2016 18:51     Labs:   Basic Metabolic Panel:  Recent Labs Lab 11/27/16 1523 11/29/16 1441 11/30/16 0358 12/02/16 0431  NA 136 138 140 139  K 3.3* 3.3* 3.6 4.2  CL 106 108 114* 115*  CO2 24 21* 19* 21*  GLUCOSE 85 86 86 91  BUN 6 <5* <5* 5*  CREATININE 0.77 0.74 0.62 0.76  CALCIUM 9.0 8.4* 7.6* 7.8*   GFR Estimated Creatinine Clearance: 120.6 mL/min (by C-G formula based on SCr of 0.76 mg/dL). Liver Function Tests:  Recent Labs Lab  11/27/16 1523 11/29/16 1441  AST 15 16  ALT 17 20  ALKPHOS 54 53  BILITOT 0.3 0.4  PROT 6.5 6.5  ALBUMIN 3.9 3.6    Recent Labs Lab 11/29/16 1441  LIPASE 26   No results for input(s): AMMONIA in the last 168 hours. Coagulation profile No results for input(s): INR, PROTIME in the last 168 hours.  CBC:  Recent Labs Lab 11/27/16 1523 11/29/16 1441 11/30/16 0358 12/02/16 0431  WBC 7.4 8.3 7.2 5.4  NEUTROABS 4.4  --   --   --   HGB 13.8 14.7 12.6 11.8*  HCT 42.0 44.2 39.3 37.0  MCV 90.1 88.6 89.1 90.7  PLT 268.0 247 230 200   Cardiac Enzymes: No results for input(s): CKTOTAL, CKMB, CKMBINDEX, TROPONINI in the last 168 hours. BNP: Invalid input(s): POCBNP CBG: No results for input(s): GLUCAP in the last 168 hours. D-Dimer No results for input(s): DDIMER in the last 72 hours. Hgb A1c No results for input(s): HGBA1C in the last 72 hours. Lipid Profile No results for input(s): CHOL, HDL, LDLCALC, TRIG, CHOLHDL, LDLDIRECT in the last 72 hours. Thyroid function studies No results for input(s): TSH, T4TOTAL, T3FREE, THYROIDAB in the last 72 hours.  Invalid input(s): FREET3 Anemia work up No results for input(s): VITAMINB12, FOLATE, FERRITIN, TIBC, IRON, RETICCTPCT in the last 72 hours. Microbiology Recent Results (from the past 240 hour(s))  Gastrointestinal  Panel by PCR , Stool     Status: None   Collection Time: 11/29/16 11:11 PM  Result Value Ref Range Status   Campylobacter species NOT DETECTED NOT DETECTED Final   Plesimonas shigelloides NOT DETECTED NOT DETECTED Final   Salmonella species NOT DETECTED NOT DETECTED Final   Yersinia enterocolitica NOT DETECTED NOT DETECTED Final   Vibrio species NOT DETECTED NOT DETECTED Final   Vibrio cholerae NOT DETECTED NOT DETECTED Final   Enteroaggregative E coli (EAEC) NOT DETECTED NOT DETECTED Final   Enteropathogenic E coli (EPEC) NOT DETECTED NOT DETECTED Final   Enterotoxigenic E coli (ETEC) NOT DETECTED NOT  DETECTED Final   Shiga like toxin producing E coli (STEC) NOT DETECTED NOT DETECTED Final   Shigella/Enteroinvasive E coli (EIEC) NOT DETECTED NOT DETECTED Final   Cryptosporidium NOT DETECTED NOT DETECTED Final   Cyclospora cayetanensis NOT DETECTED NOT DETECTED Final   Entamoeba histolytica NOT DETECTED NOT DETECTED Final   Giardia lamblia NOT DETECTED NOT DETECTED Final   Adenovirus F40/41 NOT DETECTED NOT DETECTED Final   Astrovirus NOT DETECTED NOT DETECTED Final   Norovirus GI/GII NOT DETECTED NOT DETECTED Final   Rotavirus A NOT DETECTED NOT DETECTED Final   Sapovirus (I, II, IV, and V) NOT DETECTED NOT DETECTED Final  C difficile quick scan w PCR reflex     Status: Abnormal   Collection Time: 11/29/16 11:11 PM  Result Value Ref Range Status   C Diff antigen POSITIVE (A) NEGATIVE Final   C Diff toxin POSITIVE (A) NEGATIVE Final   C Diff interpretation Toxin producing C. difficile detected.  Final    Comment: CRITICAL RESULT CALLED TO, READ BACK BY AND VERIFIED WITH: S.BLACKWELL RN AT 0757 11/30/16 BY A.DAVIS      Discharge Instructions:   Discharge Instructions    Diet general    Complete by:  As directed    Increase activity slowly    Complete by:  As directed      Allergies as of 12/02/2016      Reactions   Sulfa Antibiotics Hives   Ciprofloxacin    Amoxicillin Rash   Nickel Rash      Medication List    STOP taking these medications   dicyclomine 10 MG capsule Commonly known as:  BENTYL     TAKE these medications   adapalene 0.1 % gel Commonly known as:  DIFFERIN Apply topically at bedtime. What changed:  how much to take  when to take this  reasons to take this   busPIRone 10 MG tablet Commonly known as:  BUSPAR TAKE 1 TABLET BY MOUTH TWICE A DAY What changed:  See the new instructions.   escitalopram 5 MG tablet Commonly known as:  LEXAPRO TAKE 1 TABLET BY MOUTH EVERY DAY What changed:  See the new instructions.   fluconazole 150 MG  tablet Commonly known as:  DIFLUCAN Take 1 tablet (150 mg total) by mouth once. With onset of symptoms if needed   hydrOXYzine 25 MG tablet Commonly known as:  ATARAX/VISTARIL Take 1 tablet (25 mg total) by mouth 2 (two) times daily as needed for anxiety.   SUMAtriptan 50 MG tablet Commonly known as:  IMITREX Take 1 tablet by mouth at onset of migraine. May repeat in 2 hours if no resolve. Do not exceed 2 tablets in 24 hours. What changed:  how much to take  how to take this  when to take this  reasons to take this  additional instructions  topiramate 50 MG tablet Commonly known as:  TOPAMAX TAKE 1 TABLET BY MOUTH TWICE A DAY What changed:  See the new instructions.   vancomycin 125 MG capsule Commonly known as:  VANCOCIN Take 1 capsule (125 mg total) by mouth 4 (four) times daily.            Discharge Care Instructions        Start     Ordered   12/02/16 0000  vancomycin (VANCOCIN) 125 MG capsule  4 times daily     12/02/16 0805   12/02/16 0000  fluconazole (DIFLUCAN) 150 MG tablet   Once     12/02/16 0805   12/02/16 0000  Increase activity slowly     12/02/16 0805   12/02/16 0000  Diet general     12/02/16 0805     Follow-up Information    Doreene Nest, NP Follow up in 1 week(s).   Specialty:  Internal Medicine Contact information: [address] [place] [address] [phone]            Time coordinating discharge: 35 min  Signed:  Mauriana Dann U Zairah Arista   [facility] 12/02/2016, 8:07 AM

## 2016-12-02 NOTE — Progress Notes (Signed)
Discharge instructions gone over with the patient. Home medications discussed. Prescriptions electronically sent to patient's pharmacy. Diet, activity, and reasons to call the doctor discussed. Good hand washing with soap and water for all family members and a dedicated bathroom if possible was discussed. Patient will make follow up appointment. Patient verbalized understanding of instructions.

## 2016-12-04 ENCOUNTER — Encounter: Payer: BC Managed Care – PPO | Admitting: Primary Care

## 2017-01-21 ENCOUNTER — Encounter: Payer: Self-pay | Admitting: Primary Care

## 2017-01-21 ENCOUNTER — Ambulatory Visit (INDEPENDENT_AMBULATORY_CARE_PROVIDER_SITE_OTHER): Payer: BC Managed Care – PPO | Admitting: Primary Care

## 2017-01-21 ENCOUNTER — Other Ambulatory Visit (HOSPITAL_COMMUNITY)
Admission: RE | Admit: 2017-01-21 | Discharge: 2017-01-21 | Disposition: A | Discharge status: Home / Self Care | Payer: BC Managed Care – PPO | Source: Ambulatory Visit | Attending: Primary Care | Admitting: Primary Care

## 2017-01-21 VITALS — BP 114/76 | HR 69 | Temp 98.2°F | Ht 64.0 in | Wt 238.4 lb

## 2017-01-21 DIAGNOSIS — Z124 Encounter for screening for malignant neoplasm of cervix: Secondary | ICD-10-CM | POA: Insufficient documentation

## 2017-01-21 DIAGNOSIS — Z0000 Encounter for general adult medical examination without abnormal findings: Secondary | ICD-10-CM | POA: Insufficient documentation

## 2017-01-21 DIAGNOSIS — R51 Headache: Secondary | ICD-10-CM | POA: Diagnosis not present

## 2017-01-21 DIAGNOSIS — R519 Headache, unspecified: Secondary | ICD-10-CM

## 2017-01-21 DIAGNOSIS — F411 Generalized anxiety disorder: Secondary | ICD-10-CM | POA: Diagnosis not present

## 2017-01-21 DIAGNOSIS — Z6841 Body Mass Index (BMI) 40.0 and over, adult: Secondary | ICD-10-CM | POA: Diagnosis not present

## 2017-01-21 DIAGNOSIS — Z23 Encounter for immunization: Secondary | ICD-10-CM | POA: Diagnosis not present

## 2017-01-21 LAB — LIPID PANEL
CHOL/HDL RATIO: 2
Cholesterol: 134 mg/dL (ref 0–200)
HDL: 57.6 mg/dL (ref >39.00)
LDL Cholesterol: 61 mg/dL (ref 0–99)
NONHDL: 75.96
Triglycerides: 73 mg/dL (ref 0.0–149.0)
VLDL: 14.6 mg/dL (ref 0.0–40.0)

## 2017-01-21 LAB — COMPREHENSIVE METABOLIC PANEL
ALT: 11 U/L (ref 0–35)
AST: 12 U/L (ref 0–37)
Albumin: 4.2 g/dL (ref 3.5–5.2)
Alkaline Phosphatase: 76 U/L (ref 39–117)
BILIRUBIN TOTAL: 0.5 mg/dL (ref 0.2–1.2)
BUN: 9 mg/dL (ref 6–23)
CO2: 27 meq/L (ref 19–32)
CREATININE: 0.76 mg/dL (ref 0.40–1.20)
Calcium: 9.3 mg/dL (ref 8.4–10.5)
Chloride: 106 mEq/L (ref 96–112)
GFR: 93.85 mL/min (ref >60.00)
GLUCOSE: 81 mg/dL (ref 70–99)
Potassium: 4.3 mEq/L (ref 3.5–5.1)
SODIUM: 138 meq/L (ref 135–145)
Total Protein: 7.2 g/dL (ref 6.0–8.3)

## 2017-01-21 LAB — TSH: TSH: 1.33 u[IU]/mL (ref 0.35–4.50)

## 2017-01-21 LAB — HEMOGLOBIN A1C: Hgb A1c MFr Bld: 5.2 % (ref 4.6–6.5)

## 2017-01-21 MED ORDER — SERTRALINE HCL 25 MG PO TABS: 25.0000 mg | ORAL_TABLET | Freq: Every day | ORAL | 1 refills | Status: DC

## 2017-01-21 MED ORDER — RIZATRIPTAN BENZOATE 5 MG PO TABS: ORAL_TABLET | ORAL | 0 refills | Status: DC

## 2017-01-21 NOTE — Assessment & Plan Note (Signed)
Increased stress at work, migraines once weekly. At max dose of Topamax already, no improvement with sumatriptan. Given numerous medications and the potential for interaction, will send to headache clinic for further evaluation. Switch sumatriptan to Maxalt. Perhaps with switch from Lexapro to Zoloft this may help with headaches.

## 2017-01-21 NOTE — Progress Notes (Signed)
Subjective:    Patient ID: Casey Pope, female    DOB: 11/26/1985, 31 y.o.   MRN: [id number]  HPI  Casey Pope is a 31 year old female who presents today for complete physical and several issues.  1) GAD: Currently managed on Lexapro 5 mg, Buspar 10 mg BID, hydroxyzine PRN. She's under a lot of stress with her current occupation and is taking hydroxyzine several times weekly. She's experiencing symptoms of shaking, easily irritable, worry, little motivation to do anything that has been present since August 2018.    She was once on an increased dose of Lexapro but this caused her to feel as if she "wasn't there". She denies depression. GAD 7 score of 17.   2) Migraines: Currently managed on Topamax 50 mg BID and Sumatriptan 50 mg PRN.  She's found no improvement with sumatriptan. Migraines will occur once weekly which will last 2-3 days. She's been taking Excedrin Migraine with some improvement to get her through her work day.   Immunizations: -Tetanus: Completed in 2016 -Influenza: Due   Diet: She endorses a poor diet. Breakfast: Skips Lunch: Left overs Dinner: Pasta, vegetables, starch, meat Snacks: Chips, fruit Desserts: Candy 3-4 days weekly Beverages: Water, coffee, occasional sweet tea  Exercise: She is not currently exercising. Eye exam: No recent exam Dental exam: Completed 2 months ago. Pap Smear: Completed in 2012. Due today.    Review of Systems  Constitutional: Negative for unexpected weight change.  HENT: Negative for rhinorrhea.   Respiratory: Negative for cough and shortness of breath.   Cardiovascular: Negative for chest pain.  Gastrointestinal: Negative for constipation and diarrhea.  Genitourinary: Negative for difficulty urinating and menstrual problem.  Musculoskeletal: Negative for arthralgias and myalgias.  Skin: Negative for rash.  Allergic/Immunologic: Negative for environmental allergies.  Neurological: Positive for headaches.  Negative for dizziness and numbness.  Psychiatric/Behavioral: The patient is nervous/anxious.        Past Medical History:  Diagnosis Date  . Asthma    as a child - rarely uses inhaler  . Lower back pain    ? herniated disc  . Plantar fasciitis      Social History   Social History  . Marital status: Married    Spouse name: N/A  . Number of children: N/A  . Years of education: N/A   Occupational History  . Not on file.   Social History Main Topics  . Smoking status: Never Smoker  . Smokeless tobacco: Never Used  . Alcohol use 0.0 oz/week     Comment: socially but none with pregnancy  . Drug use: No  . Sexual activity: Yes    Birth control/ protection: None   Other Topics Concern  . Not on file   Social History Narrative   Married.   Has 3 three children.   Completed Masters degree, works as a Runner, broadcasting/film/videoteacher.   Enjoys spending time with her family.       Past Surgical History:  Procedure Laterality Date  . CESAREAN SECTION    . CESAREAN SECTION WITH BILATERAL TUBAL LIGATION Bilateral 02/27/2014   Procedure: CESAREAN SECTION WITH BILATERAL TUBAL LIGATION;  Surgeon: Mitchel HonourMegan Morris, DO;  Location: [facility];  Service: Obstetrics;  Laterality: Bilateral;  Repeat  edc 03/06/14  . CHOLECYSTECTOMY    . WISDOM TOOTH EXTRACTION      Family History  Problem Relation Age of Onset  . Hypertension Mother   . Diabetes Mother   . Arthritis Father   .  Cancer Maternal Grandfather        lung  . Inflammatory bowel disease Neg Hx     Allergies  Allergen Reactions  . Sulfa Antibiotics Hives  . Ciprofloxacin   . Amoxicillin Rash  . Nickel Rash    Current Outpatient Prescriptions on File Prior to Visit  Medication Sig Dispense Refill  . adapalene (DIFFERIN) 0.1 % gel Apply topically at bedtime. (Patient taking differently: Apply 1 application topically as needed. ) 45 g 0  . busPIRone (BUSPAR) 10 MG tablet TAKE 1 TABLET BY MOUTH TWICE A DAY 180 tablet 1  . hydrOXYzine  (ATARAX/VISTARIL) 25 MG tablet Take 1 tablet (25 mg total) by mouth 2 (two) times daily as needed for anxiety. 60 tablet 1  . topiramate (TOPAMAX) 50 MG tablet TAKE 1 TABLET BY MOUTH TWICE A DAY 180 tablet 0  . vancomycin (VANCOCIN) 125 MG capsule Take 1 capsule (125 mg total) by mouth 4 (four) times daily. 44 capsule 0   No current facility-administered medications on file prior to visit.     BP 114/76   Pulse 69   Temp 98.2 F (36.8 C) (Oral)   Ht 5\' 4"  (1.626 m)   Wt 238 lb 6.4 oz (108.1 kg)   LMP 01/07/2017   SpO2 98%   BMI 40.92 kg/m    Objective:   Physical Exam  Constitutional: She is oriented to person, place, and time. She appears well-nourished.  HENT:  Right Ear: Tympanic membrane and ear canal normal.  Left Ear: Tympanic membrane and ear canal normal.  Nose: Nose normal.  Mouth/Throat: Oropharynx is clear and moist.  Eyes: Pupils are equal, round, and reactive to light. Conjunctivae and EOM are normal.  Neck: Neck supple. No thyromegaly present.  Cardiovascular: Normal rate and regular rhythm.   No murmur heard. Pulmonary/Chest: Effort normal and breath sounds normal. She has no rales.  Abdominal: Soft. Bowel sounds are normal. There is no tenderness.  Genitourinary: There is no tenderness or lesion on the right labia. There is no tenderness or lesion on the left labia. Cervix exhibits no motion tenderness and no discharge. Right adnexum displays no tenderness and no fullness. Left adnexum displays no tenderness and no fullness. No erythema in the vagina. No vaginal discharge found.  Musculoskeletal: Normal range of motion.  Lymphadenopathy:    She has no cervical adenopathy.  Neurological: She is alert and oriented to person, place, and time. She has normal reflexes. No cranial nerve deficit.  Skin: Skin is warm and dry. No rash noted.  Psychiatric: She has a normal mood and affect.          Assessment & Plan:

## 2017-01-21 NOTE — Assessment & Plan Note (Signed)
Discussed the importance of a healthy diet and regular exercise in order for weight loss, and to reduce the risk of other medical problems.  

## 2017-01-21 NOTE — Addendum Note (Signed)
Addended by: Tawnya CrookSAMBATH, Belal Scallon on: 01/21/2017 12:27 PM   Modules accepted: Orders

## 2017-01-21 NOTE — Assessment & Plan Note (Signed)
Immunizations UTD, influenza provided today. Pap smear due, pending. Discussed the importance of a healthy diet and regular exercise in order for weight loss, and to reduce the risk of other medical problems. Exam unremarkable. Labs pending. Follow up in 1 year.

## 2017-01-21 NOTE — Patient Instructions (Addendum)
Stop ecitalopram (Lexapro) for anxiety.  Start sertraline (Zoloft) for anxiety. Take 1 tablet by mouth once daily. Please update me in 3-4 weeks, we can always increase the dose of this medication.   Use the hydroxyzine as needed for breakthrough anxiety.  Complete lab work prior to leaving today. I will notify you of your results once received.   We will notify you once we receive your Pap smear results.   You will be contacted regarding your referral to the headache clinic.  Please let us know if you have not heard back within one week.   It was a pleasure to see you today!

## 2017-01-21 NOTE — Assessment & Plan Note (Signed)
Increased symptoms. GAD 7 score of 17 today. Will switch from Lexapro to Zoloft.  Continue Buspar daily and hydroxyzine PRN. She will update us in 3-4 weeks in regards to Zoloft, consider increasing dose if needed.

## 2017-01-22 LAB — CYTOLOGY - PAP
ADEQUACY: ABSENT
DIAGNOSIS: NEGATIVE
HPV (WINDOPATH): NOT DETECTED

## 2017-02-06 ENCOUNTER — Encounter: Payer: Self-pay | Admitting: Primary Care

## 2017-02-06 DIAGNOSIS — L700 Acne vulgaris: Secondary | ICD-10-CM

## 2017-02-17 ENCOUNTER — Other Ambulatory Visit: Payer: Self-pay | Admitting: Neurology

## 2017-02-17 ENCOUNTER — Telehealth: Payer: Self-pay | Admitting: Primary Care

## 2017-02-17 DIAGNOSIS — G43019 Migraine without aura, intractable, without status migrainosus: Secondary | ICD-10-CM

## 2017-02-17 NOTE — Telephone Encounter (Signed)
Copied from CRM [phone]. Topic: Quick Communication - See Telephone Encounter >> Feb 17, 2017  1:10 PM Landry MellowFoltz, Melissa J wrote: CRM for notification. See Telephone encounter for:   02/17/17.neurologist said migraines may be triggered by a hormonal imbalance and to talk to pcp going back on bc, medication where she has her period every 3 months.   Cb number is [phone]

## 2017-02-22 ENCOUNTER — Other Ambulatory Visit: Payer: Self-pay | Admitting: Primary Care

## 2017-02-22 DIAGNOSIS — F411 Generalized anxiety disorder: Secondary | ICD-10-CM

## 2017-02-22 MED ORDER — LEVONORGEST-ETH ESTRAD 91-DAY 0.15-0.03 &0.01 MG PO TABS: 1.0000 | ORAL_TABLET | Freq: Every day | ORAL | 4 refills | Status: DC

## 2017-02-22 NOTE — Addendum Note (Signed)
Addended by: Lorre MunroeBAITY, Gracey Tolle W on: 02/22/2017 03:33 PM   Modules accepted: Orders

## 2017-02-22 NOTE — Telephone Encounter (Signed)
We can put her back on OCP. I see she was on [facility] in the past. Would she be willing to try [facility]

## 2017-02-22 NOTE — Telephone Encounter (Signed)
Medication sent to pharmacy per request 

## 2017-02-22 NOTE — Telephone Encounter (Signed)
Spoken and notified patient of Regina's comments. [facility] [address]

## 2017-02-26 ENCOUNTER — Ambulatory Visit
Admission: RE | Admit: 2017-02-26 | Discharge: 2017-02-26 | Disposition: A | Discharge status: Home / Self Care | Payer: BC Managed Care – PPO | Source: Ambulatory Visit | Attending: Neurology | Admitting: Neurology

## 2017-02-26 DIAGNOSIS — H02401 Unspecified ptosis of right eyelid: Secondary | ICD-10-CM | POA: Insufficient documentation

## 2017-02-26 DIAGNOSIS — G43019 Migraine without aura, intractable, without status migrainosus: Secondary | ICD-10-CM | POA: Diagnosis present

## 2017-02-26 MED ORDER — GADOBENATE DIMEGLUMINE 529 MG/ML IV SOLN
20.0000 mL | Freq: Once | INTRAVENOUS | Status: AC | PRN
  Administered 2017-02-26: 20 mL via INTRAVENOUS

## 2017-03-01 ENCOUNTER — Telehealth: Payer: Self-pay | Admitting: Primary Care

## 2017-03-01 DIAGNOSIS — F411 Generalized anxiety disorder: Secondary | ICD-10-CM

## 2017-03-01 NOTE — Telephone Encounter (Signed)
Not a pt here.

## 2017-03-01 NOTE — Telephone Encounter (Signed)
Copied from CRM [phone]. Topic: Quick Communication - See Telephone Encounter >> Mar 01, 2017  3:04 PM Cipriano BunkerLambe, Annette S wrote: CRM for notification. See Telephone encounter for:   [facility] [phone]  asking about dosage on Buspar  (sent escript over last week with no response)  03/01/17.

## 2017-03-03 MED ORDER — BUSPIRONE HCL 5 MG PO TABS: 10.0000 mg | ORAL_TABLET | Freq: Two times a day (BID) | ORAL | 1 refills | Status: DC

## 2017-03-03 MED ORDER — BUSPIRONE HCL 7.5 MG PO TABS: 7.5000 mg | ORAL_TABLET | Freq: Two times a day (BID) | ORAL | 1 refills | Status: DC

## 2017-03-03 NOTE — Telephone Encounter (Signed)
[facility] from [facility] calling again regarding pts rx for Buspar and needs someone to contact her asap. [phone]

## 2017-03-03 NOTE — Telephone Encounter (Signed)
Noted, spoke to patient. Will send in Buspar 7.5 mg BID. She will update if any problems. Continue Zoloft at 25 mg.

## 2017-03-03 NOTE — Telephone Encounter (Signed)
Spoken to patient. [facility] that [facility] only have 30 mg or 7.5 mg. The 10 mg and 5 mg is not available right now.

## 2017-03-03 NOTE — Telephone Encounter (Signed)
Noted.  Please notify Silva BandyKristi and patient that I changed her BuSpar to the 5 mg tablets and will have her take 2 tablets twice daily.

## 2017-03-03 NOTE — Telephone Encounter (Signed)
[facility]ed [facility] and spoken to SelbyKristi. Then apologize that it was sent to the wrong office on Monday.  She stated that Buspar 10 mg is not available and patient is out of medication.  Ask if we can send substitute for patient.

## 2017-03-23 ENCOUNTER — Other Ambulatory Visit: Payer: Self-pay | Admitting: Primary Care

## 2017-03-23 DIAGNOSIS — F411 Generalized anxiety disorder: Secondary | ICD-10-CM

## 2017-04-06 ENCOUNTER — Other Ambulatory Visit: Payer: Self-pay | Admitting: Primary Care

## 2017-04-06 DIAGNOSIS — R519 Headache, unspecified: Secondary | ICD-10-CM

## 2017-04-06 DIAGNOSIS — R51 Headache: Principal | ICD-10-CM

## 2017-04-06 NOTE — Telephone Encounter (Signed)
Last Rx 10/2016 #180. Last OV 12/2016

## 2017-04-06 NOTE — Telephone Encounter (Signed)
Did she get established with the headache clinic? If so then did they change any medications? If so, then please update. Please send Topamax refill request to headache clinic if she's established or d/c if they stopped the Topamax.

## 2017-04-07 NOTE — Telephone Encounter (Signed)
Spoke to pt. She said she went and Dr Clelia CroftShaw, neurology. He did an MRI that was normal. He increased the topiramate to 150mg  daily and put her back on BCP. She has not had a headache in 2 months. She is asking if she has to continue seeing the neurologist because it is a $85 co-pay each time she goes there. She does not need this refill right now.

## 2017-04-07 NOTE — Telephone Encounter (Signed)
Please notify patient that I am happy to refill her topiramate, I just needed an update.  Please clarify dose and instructions.  She is taking 150 mg at bedtime?  Or is she taking this in divided doses?  Please update medication on chart.  Please have her contact us when she is needing a refill.

## 2017-04-08 NOTE — Telephone Encounter (Signed)
Spoke to pt and [facility]. States she takes 50mg  during the day and 100mg  qHs. Med list updated and pt will contact office back when refill required

## 2017-04-08 NOTE — Telephone Encounter (Signed)
Noted  

## 2017-04-20 ENCOUNTER — Other Ambulatory Visit: Payer: Self-pay | Admitting: Primary Care

## 2017-04-20 DIAGNOSIS — F411 Generalized anxiety disorder: Secondary | ICD-10-CM

## 2017-04-20 MED ORDER — SERTRALINE HCL 25 MG PO TABS
25.0000 mg | ORAL_TABLET | Freq: Every day | ORAL | 0 refills | Status: DC
Start: 2017-04-20 — End: 2017-06-01

## 2017-04-27 ENCOUNTER — Telehealth: Payer: Self-pay | Admitting: Primary Care

## 2017-04-27 NOTE — Telephone Encounter (Signed)
Spoken and notified patient of Kate's comments. Patient verbalized understanding.  Patient stated that she start the birth control in November 2018

## 2017-04-27 NOTE — Telephone Encounter (Signed)
Copied from CRM [id number]. Topic: Quick Communication - See Telephone Encounter >> Apr 27, 2017 10:28 AM Herby AbrahamJohnson, Shiquita C wrote: CRM for notification. See Telephone encounter for: pt called in to be advised. Pt says that she is taking birth control (on week 3) and is having bleeding. Pt says that she was advised by her pharmacy that she shouldn't be bleeding until 3 months. Pt says that she also takes Topamax. Pt is concerned because she said that she is not sure on if she should be bleeding.   Please advise.   CB: [phone]  04/27/17.

## 2017-04-27 NOTE — Telephone Encounter (Signed)
Did she just start her birth control three weeks ago or has she been on it since it was prescribed in November 2018? We always recommend at least 3-6 months for cycles to regulate and for symptoms to improve after starting birth control.  She is on Topamax which can decrease the efficiency of birth control, but since this was recommended by her Neurologist we proceeded.

## 2017-04-28 NOTE — Telephone Encounter (Signed)
Spoken and notified patient of Kate's comments. Patient verbalized understanding. 

## 2017-04-28 NOTE — Telephone Encounter (Signed)
Noted, would recommend she give this at least 2 cycles before she decides to move on, in this case that would be 6 months since her pill pack gives her a cycle every 3 months. Just keep taking the pills as prescribed in the pack and update us if she contentiously bleeds.

## 2017-06-01 ENCOUNTER — Encounter: Payer: Self-pay | Admitting: Primary Care

## 2017-06-01 ENCOUNTER — Ambulatory Visit: Payer: BC Managed Care – PPO | Admitting: Primary Care

## 2017-06-01 DIAGNOSIS — F411 Generalized anxiety disorder: Secondary | ICD-10-CM

## 2017-06-01 NOTE — Progress Notes (Signed)
Subjective:    Patient ID: Casey Pope, female    DOB: 08/23/1985, 32 y.o.   MRN: [id number]  HPI  [name] is a 32 year old female who presents today to discuss anxiety.   She is currently managed on sertraline 25 mg and buspirone 7.5 mg BID. Previously  managed on Lexapro which lost its effectiveness at the lower dose, she couldn't tolerate a higher dose. Lexapro was switched out for sertraline in October 2018. Her buspar was also decreased from 10 mg BID to 7.5 mg BID due to availability from the pharmacy.   Since her last visit in October she felt an improvement for several months after being switched to Zoloft, then two months ago she started feeling very tired when she gets home from work, doesn't want to talk with her family. She also feels like she's secluding herself on purpose, continues to notice irritability.   She only feels these symptoms Sunday through Friday and knows they're associated with work. 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 She thought she'd notice improvement in symptoms after that, but hasn't noticed much improvement. She has had a lot of increased stress at home recenlty as her husband was working a second job. Her husbands second job will be ending soon so he'll be at home to help with the kids at night.   She denies depression. PHQ 9 score of 15 today and GAD 7 score of 16 today. Overall she's not feeling herself.   Review of Systems  Respiratory: Negative for shortness of breath.   Cardiovascular: Negative for chest pain.  Psychiatric/Behavioral:       See HPI       Past Medical History:  Diagnosis Date  . Asthma    as a child - rarely uses inhaler  . Lower back pain    ? herniated disc  . Plantar fasciitis      Social History   Socioeconomic History  . Marital status: Married    Spouse name: Not on file  . Number of children: Not on file  . Years of education: Not on  file  . Highest education level: Not on file  Social Needs  . Financial resource strain: Not on file  . Food insecurity - worry: Not on file  . Food insecurity - inability: Not on file  . Transportation needs - medical: Not on file  . Transportation needs - non-medical: Not on file  Occupational History  . Not on file  Tobacco Use  . Smoking status: Never Smoker  . Smokeless tobacco: Never Used  Substance and Sexual Activity  . Alcohol use: Yes    Alcohol/week: 0.0 oz    Comment: socially but none with pregnancy  . Drug use: No  . Sexual activity: Yes    Birth control/protection: None  Other Topics Concern  . Not on file  Social History Narrative   Married.   Has 3 three children.   Completed Masters degree, works as a Runner, broadcasting/film/videoteacher.   Enjoys spending time with her family.    Past Surgical History:  Procedure Laterality Date  . CESAREAN SECTION    . CESAREAN SECTION WITH BILATERAL TUBAL LIGATION Bilateral 02/27/2014   Procedure: CESAREAN SECTION WITH BILATERAL TUBAL LIGATION;  Surgeon: Mitchel HonourMegan Morris, DO;  Location: [facility];  Service: Obstetrics;  Laterality: Bilateral;  Repeat  edc 03/06/14  . CHOLECYSTECTOMY    . WISDOM TOOTH EXTRACTION  Family History  Problem Relation Age of Onset  . Hypertension Mother   . Diabetes Mother   . Arthritis Father   . Cancer Maternal Grandfather        lung  . Inflammatory bowel disease Neg Hx     Allergies  Allergen Reactions  . Sulfa Antibiotics Hives  . Ciprofloxacin   . Amoxicillin Rash  . Nickel Rash    Current Outpatient Medications on File Prior to Visit  Medication Sig Dispense Refill  . adapalene (DIFFERIN) 0.1 % gel Apply topically at bedtime. (Patient taking differently: Apply 1 application topically as needed. ) 45 g 0  . busPIRone (BUSPAR) 7.5 MG tablet Take 1 tablet (7.5 mg total) by mouth 2 (two) times daily. 180 tablet 1  . hydrOXYzine (ATARAX/VISTARIL) 25 MG tablet Take 1 tablet (25 mg total) by mouth 2 (two)  times daily as needed for anxiety. 60 tablet 1  . Levonorgestrel-Ethinyl Estradiol (AMETHIA,CAMRESE) 0.15-0.03 &0.01 MG tablet Take 1 tablet by mouth daily. 1 Package 4  . rizatriptan (MAXALT) 10 MG tablet Take 10 mg by mouth as needed.     . sertraline (ZOLOFT) 25 MG tablet Take 1 tablet (25 mg total) by mouth daily. 90 tablet 0  . topiramate (TOPAMAX) 50 MG tablet Take 50mg  in the morning and 100mg  at night.    . vancomycin (VANCOCIN) 125 MG capsule Take 1 capsule (125 mg total) by mouth 4 (four) times daily. 44 capsule 0   No current facility-administered medications on file prior to visit.     BP 122/78   Pulse 67   Temp 98 F (36.7 C) (Oral)   Ht 5\' 4"  (1.626 m)   Wt 230 lb 4 oz (104.4 kg)   LMP 06/01/2017   SpO2 99%   BMI 39.52 kg/m    Objective:   Physical Exam  Constitutional: She appears well-nourished.  Neck: Neck supple.  Cardiovascular: Normal rate and regular rhythm.  Pulmonary/Chest: Effort normal and breath sounds normal.  Skin: Skin is warm and dry.  Psychiatric: She has a normal mood and affect.          Assessment & Plan:

## 2017-06-01 NOTE — Assessment & Plan Note (Signed)
Doesn't seem like any improvement on Zoloft and Buspar. Failed treatment on Lexapro.   Recommend we wean her off of Zoloft and Buspar over the next two weeks. I'd like to get both medications our of her system before adding another.   Consider initiating Wellbutrin once both Zoloft and Buspar are weaned. She'll update in a few weeks, or sooner if she has problems weaning off.   PHQ 9 score of 15 and GAD 7 score of 16 today.

## 2017-06-01 NOTE — Patient Instructions (Signed)
Wean off of sertraline (Zoloft) and buspirone (Buspar).   Cut the sertraline tablet in half and take 1/2 tablet daily for one week, then 1/2 tablet every other day for one week then stop.   Start taking one tablet of buspirone (Buspar) once daily for one week, then 1 tablet every other day for one week then stop.  Please send me a message through my chart when you've come off of these medications.  It was a pleasure to see you today!

## 2017-06-03 ENCOUNTER — Ambulatory Visit: Payer: BC Managed Care – PPO | Admitting: Primary Care

## 2017-06-08 ENCOUNTER — Encounter: Payer: Self-pay | Admitting: Primary Care

## 2017-06-08 DIAGNOSIS — F411 Generalized anxiety disorder: Secondary | ICD-10-CM

## 2017-06-08 MED ORDER — BUPROPION HCL ER (SR) 100 MG PO TB12
ORAL_TABLET | ORAL | 0 refills | Status: DC
Start: 2017-06-08 — End: 2017-07-15

## 2017-07-15 ENCOUNTER — Other Ambulatory Visit: Payer: Self-pay | Admitting: Primary Care

## 2017-07-15 DIAGNOSIS — F411 Generalized anxiety disorder: Secondary | ICD-10-CM

## 2017-07-16 ENCOUNTER — Encounter (INDEPENDENT_AMBULATORY_CARE_PROVIDER_SITE_OTHER): Payer: Self-pay

## 2017-07-16 NOTE — Telephone Encounter (Signed)
Refill sent to pharmacy. Will check on patient via My chart.

## 2017-08-17 ENCOUNTER — Other Ambulatory Visit: Payer: Self-pay | Admitting: Primary Care

## 2017-08-17 DIAGNOSIS — F411 Generalized anxiety disorder: Secondary | ICD-10-CM

## 2017-10-12 ENCOUNTER — Other Ambulatory Visit: Payer: Self-pay | Admitting: Primary Care

## 2017-10-12 DIAGNOSIS — F411 Generalized anxiety disorder: Secondary | ICD-10-CM

## 2017-12-14 ENCOUNTER — Telehealth: Payer: Self-pay | Admitting: Primary Care

## 2017-12-14 ENCOUNTER — Ambulatory Visit: Payer: Self-pay | Admitting: *Deleted

## 2017-12-14 NOTE — Telephone Encounter (Signed)
Pt called requesting advice for sinus congestion. Per reference, advised to try to increase he fluids, take over the counter meds such as Allegra D, Claritin D or Sudafed. Also to try saline nasal spray and or neti pott for some relief. Hot steamy shower, and humidifier to make it easier to breathe. Take Tylenol or ibuprofen for pain around sinuses.  Pt already has an appointment scheduled for tomorrow afternoon.  Reason for Disposition . Health Information question, no triage required and triager able to answer question  Answer Assessment - Initial Assessment Questions 1. REASON FOR CALL or QUESTION: "What is your reason for calling today?" or "How can I best help you?" or "What question do you have that I can help answer?"     Question regarding home care for sinus congestion.  Protocols used: INFORMATION ONLY CALL-A-AH

## 2017-12-14 NOTE — Telephone Encounter (Signed)
Patient made appt with Rene Kocheregina on 12/15/2017

## 2017-12-14 NOTE — Telephone Encounter (Signed)
Pt last seen acute visit 06/01/17; last annual 01/21/17. No future appt scheduled.

## 2017-12-14 NOTE — Telephone Encounter (Signed)
Copied from CRM [phone]. Topic: Quick Communication - See Telephone Encounter >> Dec 14, 2017  9:10 AM Angela NevinWilliams, Candice N wrote: CRM for notification. See Telephone encounter for: 12/14/17.  Pt would like to know if she could get a medication called in for what she thinks is a sinus infection, without being seen. Pt states that she has had sinus infections before and experienced the same symptoms she says she is experiencing now such as green mucus, headache and facial tenderness. Please advise.  [id number] [facility] [address] [phone] (Phone) [phone] (Fax)

## 2017-12-14 NOTE — Telephone Encounter (Signed)
Please notify patient that I'm sorry she's not feeling well but she'll need an office visit for further evaluation.

## 2017-12-14 NOTE — Telephone Encounter (Signed)
Pt has appt to see Pamala Hurry Baity NP on 12/15/17 at 4:15.

## 2017-12-15 ENCOUNTER — Ambulatory Visit: Payer: BC Managed Care – PPO | Admitting: Internal Medicine

## 2017-12-15 ENCOUNTER — Ambulatory Visit: Payer: BC Managed Care – PPO | Admitting: Family Medicine

## 2017-12-15 DIAGNOSIS — Z0289 Encounter for other administrative examinations: Secondary | ICD-10-CM

## 2018-01-11 ENCOUNTER — Other Ambulatory Visit: Payer: Self-pay | Admitting: Primary Care

## 2018-01-11 DIAGNOSIS — F411 Generalized anxiety disorder: Secondary | ICD-10-CM

## 2018-02-25 ENCOUNTER — Other Ambulatory Visit: Payer: Self-pay | Admitting: Internal Medicine

## 2018-02-25 DIAGNOSIS — Z3041 Encounter for surveillance of contraceptive pills: Secondary | ICD-10-CM

## 2018-03-01 NOTE — Telephone Encounter (Signed)
Patient will need to be seen for CPE for further refills of her medications.  Remind her that her last physical was in October 2018.  Will send 1 month of her birth control to pharmacy until she can be seen.  Okay to squeeze her in.

## 2018-03-01 NOTE — Telephone Encounter (Signed)
[facility]  Last office visit on 06/01/2017

## 2018-03-09 NOTE — Telephone Encounter (Signed)
Left voicemail asking pt to call office.

## 2018-03-10 ENCOUNTER — Ambulatory Visit: Payer: Self-pay | Admitting: *Deleted

## 2018-03-10 NOTE — Telephone Encounter (Signed)
Discussed with PCP and okay to proceed with scheduling patient at 940 tomorrow am.  Spoke with patient and gave her appointment time.  Patient verbalizes understanding that if symptoms worsen then she is to go to the ER tonight.

## 2018-03-10 NOTE — Telephone Encounter (Signed)
Pt calling with complaints of blood in stool since Sunday. Pt states she has been having approximately 2 stools a day that have been mixed with blood and formed stool. Pt states she has not observed any clots in the toilet but states that she noticed that there is more blood than formed stool.Pt states she has had 2 stools today that have been mixed with blood. Pt states she had stomach cramping today but has not had any abdominal pain previously. Pt states that the stool is dark red in the toilet but when she wipes with tissue it is bright red. Pt denies any dizziness, chest pain, nausea or other symptoms at this time. Pt advised that if dizziness occurs or she begans to feel worse to seek treatment in the ED. Pt verbalized understanding.Pt can be contacted at [phone]. Reason for Disposition . MODERATE rectal bleeding (small blood clots, passing blood without stool, or toilet water turns red)  Answer Assessment - Initial Assessment Questions 1. APPEARANCE of BLOOD: "What color is it?" "Is it passed separately, on the surface of the stool, or mixed in with the stool?"      Dark red blood mixed in stool but bright red with stools 2. AMOUNT: "How much blood was passed?"      Quite a bit more blood than stool, no clots 3. FREQUENCY: "How many times has blood been passed with the stools?"      Has happened since Sunday 4. ONSET: "When was the blood first seen in the stools?" (Days or weeks)      Sunday but initially thought it was due to menstrual cycle 5. DIARRHEA: "Is there also some diarrhea?" If so, ask: "How many diarrhea stools were passed in past 24 hours?"      No 6. CONSTIPATION: "Do you have constipation?" If so, "How bad is it?"     No 7. RECURRENT SYMPTOMS: "Have you had blood in your stools before?" If so, ask: "When was the last time?" and "What happened that time?"      No 8. BLOOD THINNERS: "Do you take any blood thinners?" (e.g., Coumadin/warfarin, Pradaxa/dabigatran, aspirin)    No 9. OTHER SYMPTOMS: "Do you have any other symptoms?"  (e.g., abdominal pain, vomiting, dizziness, fever)     Stomach cramping, feels more tired than usual today; headaches have been worse lately and has been having to drink more caffeine 10. PREGNANCY: "Is there any chance you are pregnant?" "When was your last menstrual period?"       No pregnancy, tubes are tied  Protocols used: RECTAL BLEEDING-A-AH

## 2018-03-10 NOTE — Telephone Encounter (Signed)
Just received this from [facility] nurse who triaged patient with rectal bleeding.  Per Triage, she is to be seen within 24 hours by PCP and go to ER in meantime if symptoms worsen.  You had an opening at 940 tomorrow am which I blocked for this patient but before I call her I wanted to know if you are okay with this.   Let me know and I will call her and give time.  Thanks.

## 2018-03-11 ENCOUNTER — Ambulatory Visit: Payer: BC Managed Care – PPO | Admitting: Primary Care

## 2018-03-11 ENCOUNTER — Ambulatory Visit (INDEPENDENT_AMBULATORY_CARE_PROVIDER_SITE_OTHER)
Admission: RE | Admit: 2018-03-11 | Discharge: 2018-03-11 | Disposition: A | Discharge status: Home / Self Care | Payer: BC Managed Care – PPO | Source: Ambulatory Visit | Attending: Primary Care | Admitting: Primary Care

## 2018-03-11 ENCOUNTER — Encounter: Payer: Self-pay | Admitting: Primary Care

## 2018-03-11 VITALS — BP 116/72 | HR 88 | Temp 98.5°F | Ht 64.0 in | Wt 219.0 lb

## 2018-03-11 DIAGNOSIS — K625 Hemorrhage of anus and rectum: Secondary | ICD-10-CM

## 2018-03-11 DIAGNOSIS — R1030 Lower abdominal pain, unspecified: Secondary | ICD-10-CM

## 2018-03-11 DIAGNOSIS — K649 Unspecified hemorrhoids: Secondary | ICD-10-CM | POA: Insufficient documentation

## 2018-03-11 HISTORY — DX: Hemorrhage of anus and rectum: K62.5

## 2018-03-11 LAB — BASIC METABOLIC PANEL
BUN: 8 mg/dL (ref 6–23)
CALCIUM: 9.2 mg/dL (ref 8.4–10.5)
CO2: 24 mEq/L (ref 19–32)
Chloride: 106 mEq/L (ref 96–112)
Creatinine, Ser: 0.85 mg/dL (ref 0.40–1.20)
GFR: 81.89 mL/min (ref >60.00)
Glucose, Bld: 76 mg/dL (ref 70–99)
POTASSIUM: 3.4 meq/L — AB (ref 3.5–5.1)
SODIUM: 138 meq/L (ref 135–145)

## 2018-03-11 LAB — POC HEMOCCULT BLD/STL (OFFICE/1-CARD/DIAGNOSTIC): FECAL OCCULT BLD: POSITIVE — AB

## 2018-03-11 LAB — CBC WITH DIFFERENTIAL/PLATELET
BASOS ABS: 0 10*3/uL (ref 0.0–0.1)
Basophils Relative: 0.8 % (ref 0.0–3.0)
Eosinophils Absolute: 0.3 10*3/uL (ref 0.0–0.7)
Eosinophils Relative: 4.8 % (ref 0.0–5.0)
HCT: 41.4 % (ref 36.0–46.0)
HEMOGLOBIN: 13.8 g/dL (ref 12.0–15.0)
Lymphocytes Relative: 37.9 % (ref 12.0–46.0)
Lymphs Abs: 2.3 10*3/uL (ref 0.7–4.0)
MCHC: 33.3 g/dL (ref 30.0–36.0)
MCV: 89.6 fl (ref 78.0–100.0)
Monocytes Absolute: 0.4 10*3/uL (ref 0.1–1.0)
Monocytes Relative: 6.4 % (ref 3.0–12.0)
NEUTROS PCT: 50.1 % (ref 43.0–77.0)
Neutro Abs: 3.1 10*3/uL (ref 1.4–7.7)
Platelets: 257 10*3/uL (ref 150.0–400.0)
RBC: 4.61 Mil/uL (ref 3.87–5.11)
RDW: 13.2 % (ref 11.5–15.5)
WBC: 6.1 10*3/uL (ref 4.0–10.5)

## 2018-03-11 MED ORDER — HYDROCORTISONE ACETATE 25 MG RE SUPP: 25.0000 mg | Freq: Two times a day (BID) | RECTAL | 0 refills | Status: DC

## 2018-03-11 NOTE — Patient Instructions (Addendum)
Stop by the lab and xray prior to leaving today. I will notify you of your results once received.   Use the suppositories. Insert one suppository into the rectum twice daily for 6 days.  Start a stool softener like docusate sodium (Colace) daily to help soften stools.  Ensure you are consuming 64 ounces of water daily. Make sure to eat a lot of fiber, take a look below.   Please notify me if you start to notice fevers, your abdominal pain returns, you start to feel dizzy/weak, your bleeding persists.  It was a pleasure to see you today!   High-Fiber Diet Fiber, also called dietary fiber, is a type of carbohydrate found in fruits, vegetables, whole grains, and beans. A high-fiber diet can have many health benefits. Your health care provider may recommend a high-fiber diet to help:  Prevent constipation. Fiber can make your bowel movements more regular.  Lower your cholesterol.  Relieve hemorrhoids, uncomplicated diverticulosis, or irritable bowel syndrome.  Prevent overeating as part of a weight-loss plan.  Prevent heart disease, type 2 diabetes, and certain cancers.  What is my plan? The recommended daily intake of fiber includes:  38 grams for men under age 32.  30 grams for men over age 32.  25 grams for women under age 32.  21 grams for women over age 32.  You can get the recommended daily intake of dietary fiber by eating a variety of fruits, vegetables, grains, and beans. Your health care provider may also recommend a fiber supplement if it is not possible to get enough fiber through your diet. What do I need to know about a high-fiber diet?  Fiber supplements have not been widely studied for their effectiveness, so it is better to get fiber through food sources.  Always check the fiber content on thenutrition facts label of any prepackaged food. Look for foods that contain at least 5 grams of fiber per serving.  Ask your dietitian if you have questions about  specific foods that are related to your condition, especially if those foods are not listed in the following section.  Increase your daily fiber consumption gradually. Increasing your intake of dietary fiber too quickly may cause bloating, cramping, or gas.  Drink plenty of water. Water helps you to digest fiber. What foods can I eat? Grains Whole-grain breads. Multigrain cereal. Oats and oatmeal. Brown rice. Barley. Bulgur wheat. Millet. Bran muffins. Popcorn. Rye wafer crackers. Vegetables Sweet potatoes. Spinach. Kale. Artichokes. Cabbage. Broccoli. Green peas. Carrots. Squash. Fruits Berries. Pears. Apples. Oranges. Avocados. Prunes and raisins. Dried figs. Meats and Other Protein Sources Navy, kidney, pinto, and soy beans. Split peas. Lentils. Nuts and seeds. Dairy Fiber-fortified yogurt. Beverages Fiber-fortified soy milk. Fiber-fortified orange juice. Other Fiber bars. The items listed above may not be a complete list of recommended foods or beverages. Contact your dietitian for more options. What foods are not recommended? Grains White bread. Pasta made with refined flour. White rice. Vegetables Fried potatoes. Canned vegetables. Well-cooked vegetables. Fruits Fruit juice. Cooked, strained fruit. Meats and Other Protein Sources Fatty cuts of meat. Fried Environmental education officerpoultry or fried fish. Dairy Milk. Yogurt. Cream cheese. Sour cream. Beverages Soft drinks. Other Cakes and pastries. Butter and oils. The items listed above may not be a complete list of foods and beverages to avoid. Contact your dietitian for more information. What are some tips for including high-fiber foods in my diet?  Eat a wide variety of high-fiber foods.  Make sure that half of all grains  consumed each day are whole grains.  Replace breads and cereals made from refined flour or white flour with whole-grain breads and cereals.  Replace white rice with brown rice, bulgur wheat, or millet.  Start the day  with a breakfast that is high in fiber, such as a cereal that contains at least 5 grams of fiber per serving.  Use beans in place of meat in soups, salads, or pasta.  Eat high-fiber snacks, such as berries, raw vegetables, nuts, or popcorn. This information is not intended to replace advice given to you by your health care provider. Make sure you discuss any questions you have with your health care provider. Document Released: 03/16/2005 Document Revised: 08/22/2015 Document Reviewed: 08/29/2013 Elsevier Interactive Patient Education  [employer].

## 2018-03-11 NOTE — Assessment & Plan Note (Signed)
Present x 5 days, also with some constipation.  Suspect hemorrhoids secondary to constipation. Check plain abdominal films, CBC, BMP today. Treat with anusol suppositories.  Strict return precautions provided.

## 2018-03-11 NOTE — Progress Notes (Signed)
Subjective:    Patient ID: Casey Pope, female    DOB: [birth year], 32 y.o.   MRN: [id number]  HPI  Ms. Bunch-Johnson is a 32 year old female with a history of C difficile colitis who presents today with a chief complaint of rectal bleeding.   Began 5 days ago. Her bleeding is bright red at times and sometimes darker, occurs only with bowel movements, large amount of blood in the toilet bowl. She is having bowel movements 2-3 times daily which is typical. Consistency of her stool is soft and of usual consistency but has had to strain more than usual.    She also reports abdominal cramping that began yesterday that lasted until the rest of the day. She has been eating a lot of yogurt and applesauce. Her cramping was to the bilateral lower quadrant and suprapubic region. Her last bowel movement was last night.   She denies recent antibiotic use, nausea, vomiting, dizziness. She is eating and drinking well. She denies cramping today.   Review of Systems  Respiratory: Negative for shortness of breath.   Gastrointestinal: Positive for blood in stool and constipation. Negative for diarrhea, nausea and vomiting.       Abdominal cramping  Neurological: Negative for dizziness and weakness.       Past Medical History:  Diagnosis Date  . Asthma    as a child - rarely uses inhaler  . Lower back pain    ? herniated disc  . Plantar fasciitis      Social History   Socioeconomic History  . Marital status: Married    Spouse name: Not on file  . Number of children: Not on file  . Years of education: Not on file  . Highest education level: Not on file  Occupational History  . Not on file  Social Needs  . Financial resource strain: Not on file  . Food insecurity:    Worry: Not on file    Inability: Not on file  . Transportation needs:    Medical: Not on file    Non-medical: Not on file  Tobacco Use  . Smoking status: Never Smoker  . Smokeless tobacco: Never Used    Substance and Sexual Activity  . Alcohol use: Yes    Alcohol/week: 0.0 standard drinks    Comment: socially but none with pregnancy  . Drug use: No  . Sexual activity: Yes    Birth control/protection: None  Lifestyle  . Physical activity:    Days per week: Not on file    Minutes per session: Not on file  . Stress: Not on file  Relationships  . Social connections:    Talks on phone: Not on file    Gets together: Not on file    Attends religious service: Not on file    Active member of club or organization: Not on file    Attends meetings of clubs or organizations: Not on file    Relationship status: Not on file  . Intimate partner violence:    Fear of current or ex partner: Not on file    Emotionally abused: Not on file    Physically abused: Not on file    Forced sexual activity: Not on file  Other Topics Concern  . Not on file  Social History Narrative   Married.   Has 3 three children.   Completed Masters degree, works as a Runner, broadcasting/film/video.   Enjoys spending time with her family.    Past  Surgical History:  Procedure Laterality Date  . CESAREAN SECTION    . CESAREAN SECTION WITH BILATERAL TUBAL LIGATION Bilateral 02/27/2014   Procedure: CESAREAN SECTION WITH BILATERAL TUBAL LIGATION;  Surgeon: Mitchel HonourMegan Morris, DO;  Location: [facility];  Service: Obstetrics;  Laterality: Bilateral;  Repeat  edc 03/06/14  . CHOLECYSTECTOMY    . WISDOM TOOTH EXTRACTION      Family History  Problem Relation Age of Onset  . Hypertension Mother   . Diabetes Mother   . Arthritis Father   . Cancer Maternal Grandfather        lung  . Inflammatory bowel disease Neg Hx     Allergies  Allergen Reactions  . Sulfa Antibiotics Hives  . Ciprofloxacin   . Clindamycin/Lincomycin Other (See Comments)    C-Diff  . Amoxicillin Rash  . Nickel Rash    Current Outpatient Medications on File Prior to Visit  Medication Sig Dispense Refill  . adapalene (DIFFERIN) 0.1 % gel Apply topically at bedtime.  (Patient taking differently: Apply 1 application topically as needed. ) 45 g 0  . buPROPion (WELLBUTRIN SR) 100 MG 12 hr tablet TAKE 1 TABLET BY MOUTH TWICE A DAY 180 tablet 0  . hydrOXYzine (ATARAX/VISTARIL) 25 MG tablet Take 1 tablet (25 mg total) by mouth 2 (two) times daily as needed for anxiety. 60 tablet 1  . Levonorgestrel-Ethinyl Estradiol (AMETHIA,CAMRESE) 0.15-0.03 &0.01 MG tablet TAKE 1 TABLET BY MOUTH EVERY DAY 91 tablet 0  . rizatriptan (MAXALT) 10 MG tablet Take 10 mg by mouth as needed.     . topiramate (TOPAMAX) 50 MG tablet Take 50mg  in the morning and 100mg  at night.     No current facility-administered medications on file prior to visit.     BP 116/72   Pulse 88   Temp 98.5 F (36.9 C) (Oral)   Ht 5\' 4"  (1.626 m)   Wt 219 lb (99.3 kg)   LMP 03/03/2018   SpO2 98%   BMI 37.59 kg/m    Objective:   Physical Exam  Constitutional: She appears well-nourished.  Cardiovascular: Normal rate.  Respiratory: Effort normal.  GI: Soft. Bowel sounds are normal. There is no abdominal tenderness.  Genitourinary: Rectum:     Guaiac result positive.     External hemorrhoid and internal hemorrhoid present.     Genitourinary Comments: Small non thrombosed external hemorrhoid, several small internal hemorrhoids.    Skin: Skin is warm and dry.           Assessment & Plan:

## 2018-03-11 NOTE — Addendum Note (Signed)
Addended by: Tawnya CrookSAMBATH, Leonette Tischer on: 03/11/2018 04:14 PM   Modules accepted: Orders

## 2018-03-11 NOTE — Assessment & Plan Note (Signed)
Occurred one day ago only and has since dissipated.  Exam today unremarkable. Could be secondary to constipation. Check plain films and CBC with diff today. CT of abdomen/pelvis in 2018 without evidence of diverticulosis.  Given history of colitis I provided her with strict return precautions.  She does not appear sickly today.

## 2018-03-11 NOTE — Assessment & Plan Note (Signed)
Noted externally and internally which could very well be the cause of her bleeding. Stool card positive today. No obvious bleeding on exam.  Rx for Anusol suppositories provided.

## 2018-03-14 ENCOUNTER — Other Ambulatory Visit: Payer: Self-pay | Admitting: Primary Care

## 2018-03-14 DIAGNOSIS — Z0000 Encounter for general adult medical examination without abnormal findings: Secondary | ICD-10-CM

## 2018-03-25 ENCOUNTER — Other Ambulatory Visit: Payer: BC Managed Care – PPO

## 2018-03-28 ENCOUNTER — Other Ambulatory Visit (INDEPENDENT_AMBULATORY_CARE_PROVIDER_SITE_OTHER): Payer: BC Managed Care – PPO

## 2018-03-28 DIAGNOSIS — Z0000 Encounter for general adult medical examination without abnormal findings: Secondary | ICD-10-CM | POA: Diagnosis not present

## 2018-03-28 LAB — LIPID PANEL
CHOLESTEROL: 117 mg/dL (ref 0–200)
HDL: 50.1 mg/dL (ref >39.00)
LDL Cholesterol: 54 mg/dL (ref 0–99)
NONHDL: 67.11
Total CHOL/HDL Ratio: 2
Triglycerides: 68 mg/dL (ref 0.0–149.0)
VLDL: 13.6 mg/dL (ref 0.0–40.0)

## 2018-03-28 LAB — BASIC METABOLIC PANEL
BUN: 8 mg/dL (ref 6–23)
CHLORIDE: 108 meq/L (ref 96–112)
CO2: 22 mEq/L (ref 19–32)
Calcium: 8.9 mg/dL (ref 8.4–10.5)
Creatinine, Ser: 0.9 mg/dL (ref 0.40–1.20)
GFR: 76.64 mL/min (ref >60.00)
Glucose, Bld: 79 mg/dL (ref 70–99)
POTASSIUM: 3.9 meq/L (ref 3.5–5.1)
SODIUM: 138 meq/L (ref 135–145)

## 2018-04-01 ENCOUNTER — Ambulatory Visit: Payer: BC Managed Care – PPO | Admitting: Primary Care

## 2018-04-01 ENCOUNTER — Encounter: Payer: Self-pay | Admitting: Primary Care

## 2018-04-01 VITALS — BP 108/76 | HR 68 | Temp 98.2°F | Ht 64.0 in | Wt 219.0 lb

## 2018-04-01 DIAGNOSIS — K649 Unspecified hemorrhoids: Secondary | ICD-10-CM

## 2018-04-01 DIAGNOSIS — Z23 Encounter for immunization: Secondary | ICD-10-CM

## 2018-04-01 DIAGNOSIS — Z0000 Encounter for general adult medical examination without abnormal findings: Secondary | ICD-10-CM | POA: Diagnosis not present

## 2018-04-01 DIAGNOSIS — R51 Headache: Secondary | ICD-10-CM | POA: Diagnosis not present

## 2018-04-01 DIAGNOSIS — F411 Generalized anxiety disorder: Secondary | ICD-10-CM | POA: Diagnosis not present

## 2018-04-01 DIAGNOSIS — R519 Headache, unspecified: Secondary | ICD-10-CM

## 2018-04-01 MED ORDER — BUPROPION HCL ER (SR) 100 MG PO TB12: 100.0000 mg | ORAL_TABLET | Freq: Two times a day (BID) | ORAL | 3 refills | Status: DC

## 2018-04-01 MED ORDER — TOPIRAMATE 50 MG PO TABS: ORAL_TABLET | ORAL | 3 refills | Status: DC

## 2018-04-01 MED ORDER — HYDROXYZINE HCL 25 MG PO TABS: 25.0000 mg | ORAL_TABLET | Freq: Two times a day (BID) | ORAL | 0 refills | Status: DC | PRN

## 2018-04-01 NOTE — Assessment & Plan Note (Signed)
Doing well on topiramate as prescribed. Continue current regimen. Refills sent to pharmacy.

## 2018-04-01 NOTE — Assessment & Plan Note (Signed)
Resolved with Miralax and Anusol suppositories.

## 2018-04-01 NOTE — Assessment & Plan Note (Signed)
Doing well on Wellbutrin, refills sent to pharmacy. Renewed Rx for Hydroxyzine, using infrequently.

## 2018-04-01 NOTE — Assessment & Plan Note (Signed)
Immunizations UTD. Pap smear UTD. Recommended regular exercise, work on diet. Exam unremarkable. Labs reviewed. Follow up in 1 year for CPE.

## 2018-04-01 NOTE — Addendum Note (Signed)
Addended by: Tawnya Crook on: 04/01/2018 11:29 AM   Modules accepted: Orders

## 2018-04-01 NOTE — Patient Instructions (Signed)
Start exercising. You should be getting 150 minutes of moderate intensity exercise weekly.  It's important to improve your diet by reducing consumption of fast food, fried food, processed snack foods, sugary drinks. Increase consumption of fresh vegetables and fruits, whole grains, water.  Ensure you are drinking 64 ounces of water daily.  I sent refills of your medications to your pharmacy.  We will see you in one year or sooner if needed.  It was a pleasure to see you today!   Preventive Care 18-39 Years, Female Preventive care refers to lifestyle choices and visits with your health care provider that can promote health and wellness. What does preventive care include?   A yearly physical exam. This is also called an annual well check.  Dental exams once or twice a year.  Routine eye exams. Ask your health care provider how often you should have your eyes checked.  Personal lifestyle choices, including: ? Daily care of your teeth and gums. ? Regular physical activity. ? Eating a healthy diet. ? Avoiding tobacco and drug use. ? Limiting alcohol use. ? Practicing safe sex. ? Taking vitamin and mineral supplements as recommended by your health care provider. What happens during an annual well check? The services and screenings done by your health care provider during your annual well check will depend on your age, overall health, lifestyle risk factors, and family history of disease. Counseling Your health care provider may ask you questions about your:  Alcohol use.  Tobacco use.  Drug use.  Emotional well-being.  Home and relationship well-being.  Sexual activity.  Eating habits.  Work and work Statistician.  Method of birth control.  Menstrual cycle.  Pregnancy history. Screening You may have the following tests or measurements:  Height, weight, and BMI.  Diabetes screening. This is done by checking your blood sugar (glucose) after you have not eaten for  a while (fasting).  Blood pressure.  Lipid and cholesterol levels. These may be checked every 5 years starting at age 73.  Skin check.  Hepatitis C blood test.  Hepatitis B blood test.  Sexually transmitted disease (STD) testing.  BRCA-related cancer screening. This may be done if you have a family history of breast, ovarian, tubal, or peritoneal cancers.  Pelvic exam and Pap test. This may be done every 3 years starting at age 85. Starting at age 46, this may be done every 5 years if you have a Pap test in combination with an HPV test. Discuss your test results, treatment options, and if necessary, the need for more tests with your health care provider. Vaccines Your health care provider may recommend certain vaccines, such as:  Influenza vaccine. This is recommended every year.  Tetanus, diphtheria, and acellular pertussis (Tdap, Td) vaccine. You may need a Td booster every 10 years.  Varicella vaccine. You may need this if you have not been vaccinated.  HPV vaccine. If you are 10 or younger, you may need three doses over 6 months.  Measles, mumps, and rubella (MMR) vaccine. You may need at least one dose of MMR. You may also need a second dose.  Pneumococcal 13-valent conjugate (PCV13) vaccine. You may need this if you have certain conditions and were not previously vaccinated.  Pneumococcal polysaccharide (PPSV23) vaccine. You may need one or two doses if you smoke cigarettes or if you have certain conditions.  Meningococcal vaccine. One dose is recommended if you are age 50-21 years and a Market researcher living in a residence hall, or  if you have one of several medical conditions. You may also need additional booster doses.  Hepatitis A vaccine. You may need this if you have certain conditions or if you travel or work in places where you may be exposed to hepatitis A.  Hepatitis B vaccine. You may need this if you have certain conditions or if you travel or work  in places where you may be exposed to hepatitis B.  Haemophilus influenzae type b (Hib) vaccine. You may need this if you have certain risk factors. Talk to your health care provider about which screenings and vaccines you need and how often you need them. This information is not intended to replace advice given to you by your health care provider. Make sure you discuss any questions you have with your health care provider. Document Released: 05/12/2001 Document Revised: 10/27/2016 Document Reviewed: 01/15/2015 Elsevier Interactive Patient Education  2019 [employer].

## 2018-04-01 NOTE — Progress Notes (Signed)
Subjective:    Patient ID: Casey Pope, female    DOB: 02-May-1985, 33 y.o.   MRN: [id number]  HPI  [name] is a 33 year old female who presents today for complete physical. She is needing refills. She has no complaints today.  Immunizations: -Tetanus: Completed in 2016 -Influenza: Declines   Diet: She endorses a fair diet Breakfast: Cereal, granola bar Lunch: Soup, yogurt, left overs Dinner: Pasta, meat, vegetables, starch Snacks: Cheese, crackers, nuts Desserts: Daily Beverages: Water, soda  Exercise: She is not exercising Eye exam: Completed in 2019 Dental exam: Completed in 2019 Pap Smear: Completed in 2018, due in 2021   Review of Systems  Constitutional: Negative for unexpected weight change.  HENT: Negative for rhinorrhea.   Respiratory: Negative for cough and shortness of breath.   Cardiovascular: Negative for chest pain.  Gastrointestinal: Negative for constipation and diarrhea.  Genitourinary: Negative for difficulty urinating and menstrual problem.  Musculoskeletal: Negative for arthralgias and myalgias.  Skin: Negative for rash.  Allergic/Immunologic: Negative for environmental allergies.  Neurological: Negative for dizziness and numbness.       Headaches overall infrequent   Psychiatric/Behavioral: The patient is not nervous/anxious.        Past Medical History:  Diagnosis Date  . Asthma    as a child - rarely uses inhaler  . Lower back pain    ? herniated disc  . Plantar fasciitis      Social History   Socioeconomic History  . Marital status: Married    Spouse name: Not on file  . Number of children: Not on file  . Years of education: Not on file  . Highest education level: Not on file  Occupational History  . Not on file  Social Needs  . Financial resource strain: Not on file  . Food insecurity:    Worry: Not on file    Inability: Not on file  . Transportation needs:    Medical: Not on file    Non-medical:  Not on file  Tobacco Use  . Smoking status: Never Smoker  . Smokeless tobacco: Never Used  Substance and Sexual Activity  . Alcohol use: Yes    Alcohol/week: 0.0 standard drinks    Comment: socially but none with pregnancy  . Drug use: No  . Sexual activity: Yes    Birth control/protection: None  Lifestyle  . Physical activity:    Days per week: Not on file    Minutes per session: Not on file  . Stress: Not on file  Relationships  . Social connections:    Talks on phone: Not on file    Gets together: Not on file    Attends religious service: Not on file    Active member of club or organization: Not on file    Attends meetings of clubs or organizations: Not on file    Relationship status: Not on file  . Intimate partner violence:    Fear of current or ex partner: Not on file    Emotionally abused: Not on file    Physically abused: Not on file    Forced sexual activity: Not on file  Other Topics Concern  . Not on file  Social History Narrative   Married.   Has 3 three children.   Completed Masters degree, works as a Runner, broadcasting/film/video.   Enjoys spending time with her family.    Past Surgical History:  Procedure Laterality Date  . CESAREAN SECTION    .  CESAREAN SECTION WITH BILATERAL TUBAL LIGATION Bilateral 02/27/2014   Procedure: CESAREAN SECTION WITH BILATERAL TUBAL LIGATION;  Surgeon: Mitchel Honour, DO;  Location: [facility];  Service: Obstetrics;  Laterality: Bilateral;  Repeat  edc 03/06/14  . CHOLECYSTECTOMY    . WISDOM TOOTH EXTRACTION      Family History  Problem Relation Age of Onset  . Hypertension Mother   . Diabetes Mother   . Arthritis Father   . Cancer Maternal Grandfather        lung  . Inflammatory bowel disease Neg Hx     Allergies  Allergen Reactions  . Sulfa Antibiotics Hives  . Ciprofloxacin   . Clindamycin/Lincomycin Other (See Comments)    C-Diff  . Amoxicillin Rash  . Nickel Rash    Current Outpatient Medications on File Prior to Visit    Medication Sig Dispense Refill  . buPROPion (WELLBUTRIN SR) 100 MG 12 hr tablet TAKE 1 TABLET BY MOUTH TWICE A DAY 180 tablet 0  . hydrOXYzine (ATARAX/VISTARIL) 25 MG tablet Take 1 tablet (25 mg total) by mouth 2 (two) times daily as needed for anxiety. 60 tablet 1  . Levonorgestrel-Ethinyl Estradiol (AMETHIA,CAMRESE) 0.15-0.03 &0.01 MG tablet TAKE 1 TABLET BY MOUTH EVERY DAY 91 tablet 0  . rizatriptan (MAXALT) 10 MG tablet Take 10 mg by mouth as needed.     . topiramate (TOPAMAX) 50 MG tablet Take 50mg  in the morning and 100mg  at night.     No current facility-administered medications on file prior to visit.     BP 108/76   Pulse 68   Temp 98.2 F (36.8 C) (Oral)   Ht 5\' 4"  (1.626 m)   Wt 219 lb (99.3 kg)   LMP 03/03/2018   SpO2 98%   BMI 37.59 kg/m    Objective:   Physical Exam  Constitutional: She is oriented to person, place, and time. She appears well-nourished.  HENT:  Mouth/Throat: No oropharyngeal exudate.  Eyes: Pupils are equal, round, and reactive to light. EOM are normal.  Neck: Neck supple. No thyromegaly present.  Cardiovascular: Normal rate and regular rhythm.  Respiratory: Effort normal and breath sounds normal.  GI: Soft. Bowel sounds are normal. There is no abdominal tenderness.  Musculoskeletal: Normal range of motion.  Neurological: She is alert and oriented to person, place, and time.  Skin: Skin is warm and dry.  Psychiatric: She has a normal mood and affect.           Assessment & Plan:

## 2018-04-15 ENCOUNTER — Other Ambulatory Visit: Payer: Self-pay | Admitting: Primary Care

## 2018-04-15 DIAGNOSIS — F411 Generalized anxiety disorder: Secondary | ICD-10-CM

## 2018-04-18 NOTE — Telephone Encounter (Signed)
Ok to change? Last prescribed on 04/01/2018 # 60 with 0 refills . Last office visit on 04/01/2018. No future appointment

## 2018-04-18 NOTE — Telephone Encounter (Signed)
This is an early refill. How often is she having to take her hydroxyzine? Does she actually need a refill?

## 2018-04-19 NOTE — Telephone Encounter (Signed)
Spoken to patient and will decline Rx. Patient have not pick up Rx. She will do so by the end of the month and she have already let the pharmacy know.

## 2018-04-20 ENCOUNTER — Other Ambulatory Visit: Payer: Self-pay | Admitting: Primary Care

## 2018-04-20 DIAGNOSIS — F411 Generalized anxiety disorder: Secondary | ICD-10-CM

## 2018-04-20 NOTE — Addendum Note (Signed)
Addended by: Tawnya Crook on: 04/20/2018 12:26 PM   Modules accepted: Orders

## 2018-05-29 ENCOUNTER — Other Ambulatory Visit: Payer: Self-pay | Admitting: Primary Care

## 2018-05-29 DIAGNOSIS — Z3041 Encounter for surveillance of contraceptive pills: Secondary | ICD-10-CM

## 2018-06-20 ENCOUNTER — Other Ambulatory Visit: Payer: Self-pay | Admitting: Primary Care

## 2018-06-20 DIAGNOSIS — F411 Generalized anxiety disorder: Secondary | ICD-10-CM

## 2018-06-21 NOTE — Telephone Encounter (Signed)
See my chart message

## 2018-07-04 ENCOUNTER — Encounter: Payer: Self-pay | Admitting: Primary Care

## 2018-07-04 ENCOUNTER — Other Ambulatory Visit: Payer: Self-pay

## 2018-07-04 ENCOUNTER — Ambulatory Visit (INDEPENDENT_AMBULATORY_CARE_PROVIDER_SITE_OTHER): Payer: BC Managed Care – PPO | Admitting: Primary Care

## 2018-07-04 DIAGNOSIS — F411 Generalized anxiety disorder: Secondary | ICD-10-CM | POA: Diagnosis not present

## 2018-07-04 NOTE — Assessment & Plan Note (Signed)
Acute on chronic anxiety flare from recent pandemic and added stress. She does appear well and is not in distress. Suspect that she's not getting the best effect from her Wellbutrin SR dosing as she's taking both tablets in the morning without anything in the evening. We will star by separating the doses to AM and PM in an effort to avoid reduction of the medication in her body. We also discussed to use the hydroxyzine BID PRN. She will update.

## 2018-07-04 NOTE — Patient Instructions (Signed)
Start taking your bupropion 100 mg twice daily as discussed. You may take the hydroxyzine twice daily if needed for acute anxiety. Please update me in 1-2 weeks.  It was nice to see you! Congratulations on your weight loss! Mayra Reel, NP-C

## 2018-07-04 NOTE — Progress Notes (Signed)
Subjective:    Patient ID: Casey Pope, female    DOB: [birth year], 33 y.o.   MRN: [id number]  HPI  Virtual Visit via Video Note  I connected with Casey Pope on 07/04/18 at 11:40 AM EDT by a video enabled telemedicine application and verified that I am speaking with the correct person using two identifiers.   I discussed the limitations of evaluation and management by telemedicine and the availability of in person appointments. The patient expressed understanding and agreed to proceed. She is at home, I am in the office.  History of Present Illness:  Ms. Rifka Thole is a 33 year old female with a history of anxiety disorder who presents today via video with a chief complaint of increased anxiety. Symptoms include irritability, feeling anxious, feeling like she wants to cry, having difficulty calming down. She is currently managed on bupropion SR 200 mg once daily and was doing well. She is also using the hydroxyzine once daily for anxiety with improvement. Symptoms have increased over the last one month given recent Covid-19. 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 She denies SI/HI.   Observations/Objective:  Alert and oriented. No distress. Appears calm.  Assessment and Plan:  Acute on chronic anxiety flare from recent pandemic and added stress. She does appear well and is not in distress. Suspect that she's not getting the best effect from her Wellbutrin SR dosing as she's taking both tablets in the morning without anything in the evening. We will star by separating the doses to AM and PM in an effort to avoid reduction of the medication in her body. We also discussed to use the hydroxyzine BID PRN. She will update.   Follow Up Instructions:  Start taking your bupropion 100 mg twice daily as discussed. You may take the hydroxyzine twice daily if needed for acute anxiety. Please update me in 1-2 weeks.   It was nice to see you! Congratulations on your weight loss! Mayra Reel, NP-C    I discussed the assessment and treatment plan with the patient. The patient was provided an opportunity to ask questions and all were answered. The patient agreed with the plan and demonstrated an understanding of the instructions.   The patient was advised to call back or seek an in-person evaluation if the symptoms worsen or if the condition fails to improve as anticipated.     Doreene Nest, NP    Review of Systems  Respiratory: Negative for shortness of breath.   Cardiovascular: Negative for chest pain.  Psychiatric/Behavioral:       See HPI       Past Medical History:  Diagnosis Date  . Asthma    as a child - rarely uses inhaler  . C. difficile colitis 12/01/2016  . Colitis 11/29/2016  . Disease of appendix   . Lower back pain    ? herniated disc  . Plantar fasciitis   . Rectal bleeding 03/11/2018     Social History   Socioeconomic History  . Marital status: Married    Spouse name: Not on file  . Number of children: Not on file  . Years of education: Not on file  . Highest education level: Not on file  Occupational History  . Not on file  Social Needs  . Financial resource strain: Not on file  . Food insecurity:    Worry: Not on file    Inability: Not  on file  . Transportation needs:    Medical: Not on file    Non-medical: Not on file  Tobacco Use  . Smoking status: Never Smoker  . Smokeless tobacco: Never Used  Substance and Sexual Activity  . Alcohol use: Yes    Alcohol/week: 0.0 standard drinks    Comment: socially but none with pregnancy  . Drug use: No  . Sexual activity: Yes    Birth control/protection: None  Lifestyle  . Physical activity:    Days per week: Not on file    Minutes per session: Not on file  . Stress: Not on file  Relationships  . Social connections:    Talks on phone: Not on file    Gets together: Not on file    Attends religious  service: Not on file    Active member of club or organization: Not on file    Attends meetings of clubs or organizations: Not on file    Relationship status: Not on file  . Intimate partner violence:    Fear of current or ex partner: Not on file    Emotionally abused: Not on file    Physically abused: Not on file    Forced sexual activity: Not on file  Other Topics Concern  . Not on file  Social History Narrative   Married.   Has 3 three children.   Completed Masters degree, works as a Runner, broadcasting/film/videoteacher.   Enjoys spending time with her family.    Past Surgical History:  Procedure Laterality Date  . CESAREAN SECTION    . CESAREAN SECTION WITH BILATERAL TUBAL LIGATION Bilateral 02/27/2014   Procedure: CESAREAN SECTION WITH BILATERAL TUBAL LIGATION;  Surgeon: Mitchel HonourMegan Morris, DO;  Location: [facility];  Service: Obstetrics;  Laterality: Bilateral;  Repeat  edc 03/06/14  . CHOLECYSTECTOMY    . WISDOM TOOTH EXTRACTION      Family History  Problem Relation Age of Onset  . Hypertension Mother   . Diabetes Mother   . Arthritis Father   . Cancer Maternal Grandfather        lung  . Inflammatory bowel disease Neg Hx     Allergies  Allergen Reactions  . Sulfa Antibiotics Hives  . Ciprofloxacin   . Clindamycin/Lincomycin Other (See Comments)    C-Diff  . Amoxicillin Rash  . Nickel Rash    Current Outpatient Medications on File Prior to Visit  Medication Sig Dispense Refill  . buPROPion (WELLBUTRIN SR) 100 MG 12 hr tablet Take 1 tablet (100 mg total) by mouth 2 (two) times daily. 180 tablet 3  . hydrOXYzine (ATARAX/VISTARIL) 25 MG tablet Take 1 tablet (25 mg total) by mouth 2 (two) times daily as needed for anxiety. 60 tablet 0  . Levonorgestrel-Ethinyl Estradiol (AMETHIA,CAMRESE) 0.15-0.03 &0.01 MG tablet TAKE 1 TABLET BY MOUTH EVERY DAY 91 tablet 1  . rizatriptan (MAXALT) 10 MG tablet Take 10 mg by mouth as needed.     . topiramate (TOPAMAX) 50 MG tablet Take 1 tablet in the morning and 2  tablets at night for headache prevention. 270 tablet 3   No current facility-administered medications on file prior to visit.     LMP 06/03/2018    Objective:   Physical Exam  Constitutional: She is oriented to person, place, and time. She appears well-nourished.  Respiratory: Effort normal.  Neurological: She is alert and oriented to person, place, and time.  Psychiatric: She has a normal mood and affect.  Assessment & Plan:

## 2018-10-03 IMAGING — CT CT ABD-PELV W/ CM
2 of 4 series · 16 of 46 positions shown, 18 images · IV contrast (APPLIED)
Comparison: None.

CLINICAL DATA: Abdominal pain. Right-sided pain for 2 weeks,
increased over the past 2 days. Fever.

EXAM:
CT ABDOMEN AND PELVIS WITH CONTRAST
TECHNIQUE: Multidetector CT imaging of the abdomen and pelvis was performed
using the standard protocol following bolus administration of
intravenous contrast.
CONTRAST:  100mL 1220LU-877 IOPAMIDOL (1220LU-877) INJECTION 61%

[Series 3: abd/ pelvis 5.0 i30f 2 · axial · 0.98mm/px · z∈[+623,+1048]mm · 13 of 93 slices shown, 15 images]
[im 4/93  soft-tissue]
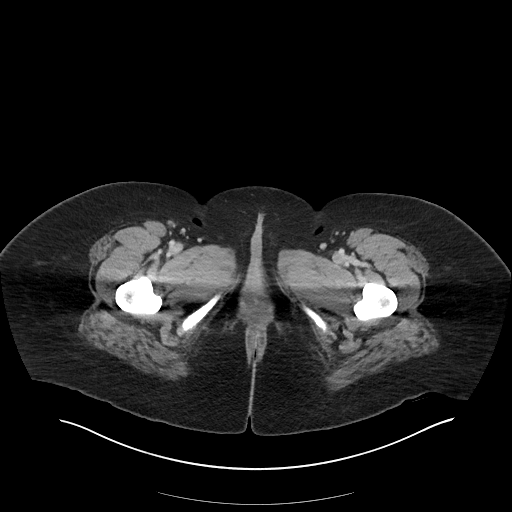
[im 4/93  bone]
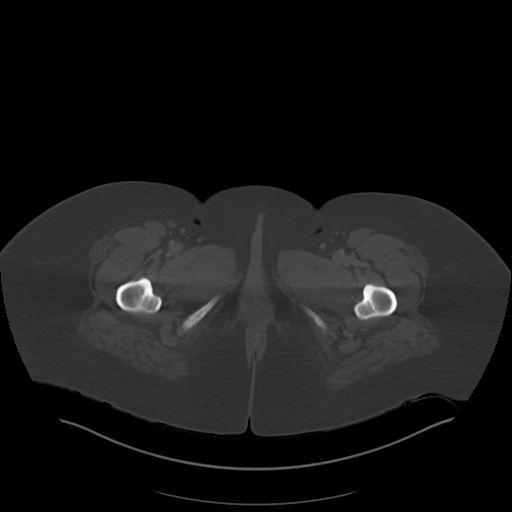
[im 12/93  soft-tissue]
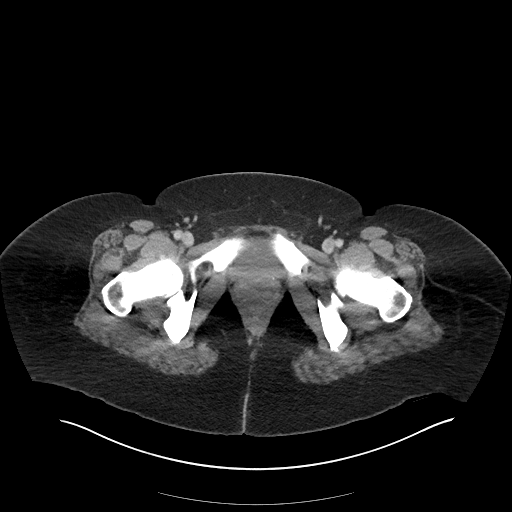
[im 20/93  soft-tissue]
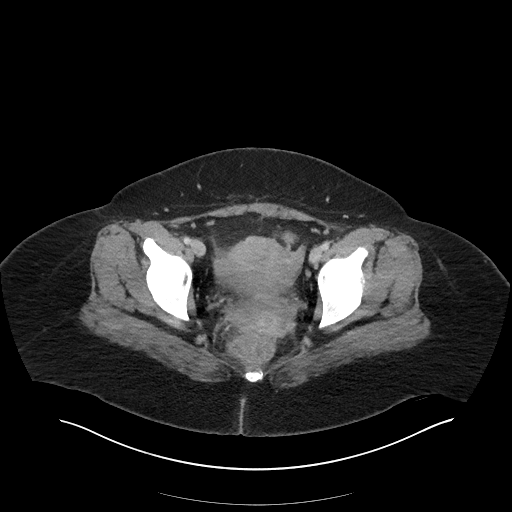
[im 27/93  soft-tissue]
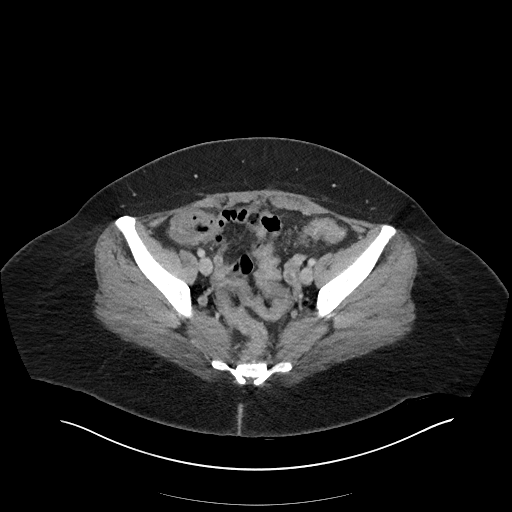
[im 31/93  soft-tissue]
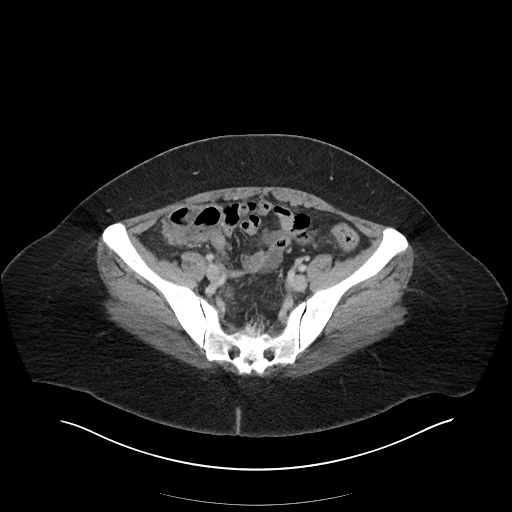
[im 39/93  soft-tissue]
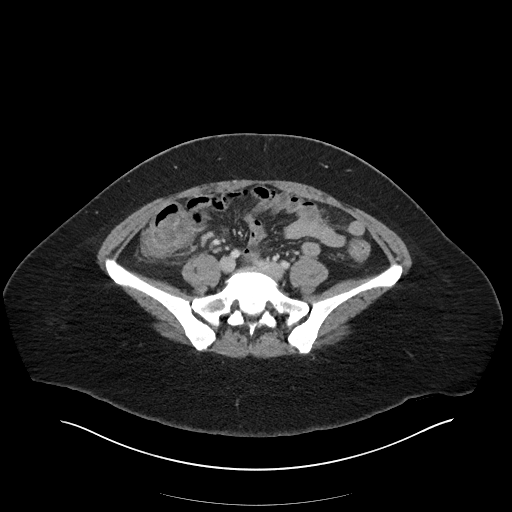
[im 47/93  soft-tissue]
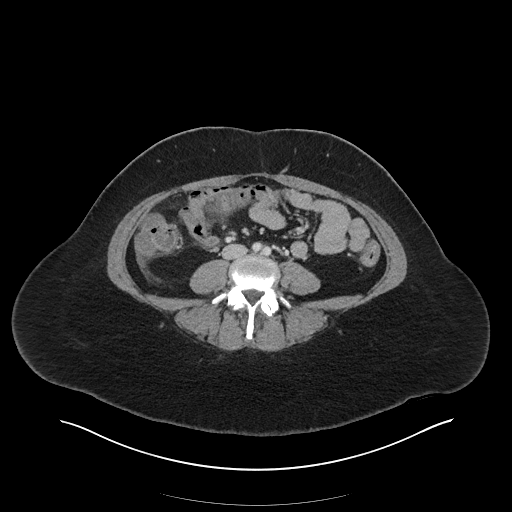
[im 54/93  soft-tissue]
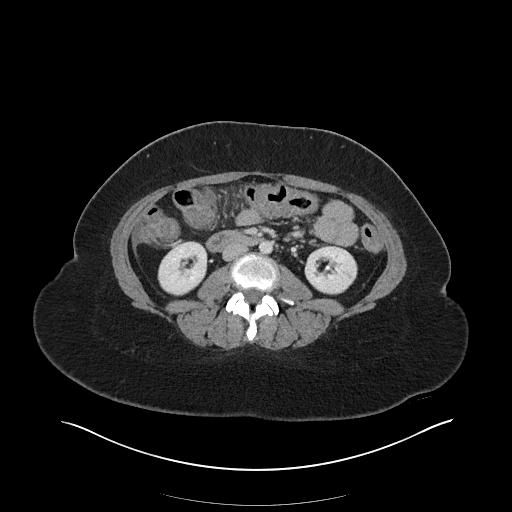
[im 62/93  soft-tissue]
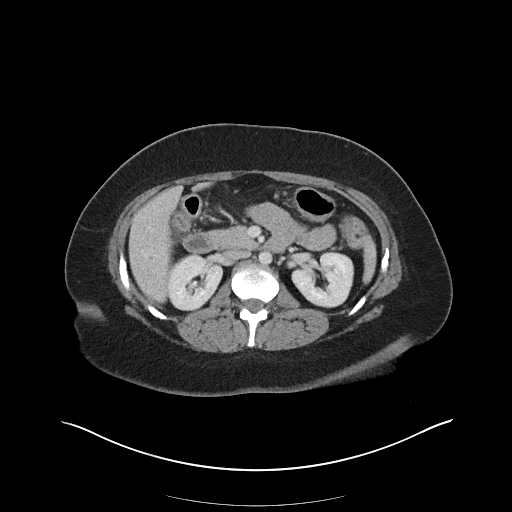
[im 62/93  bone]
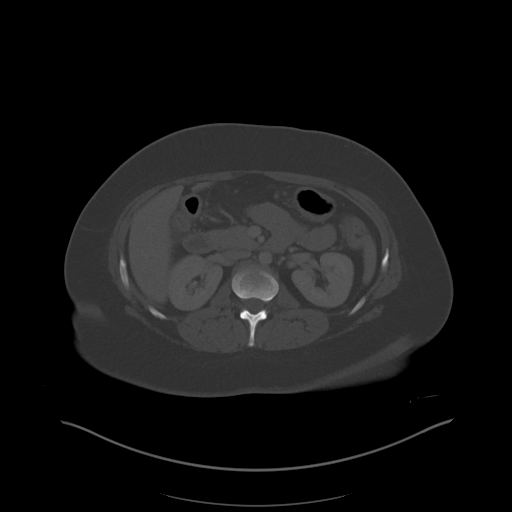
[im 66/93  soft-tissue]
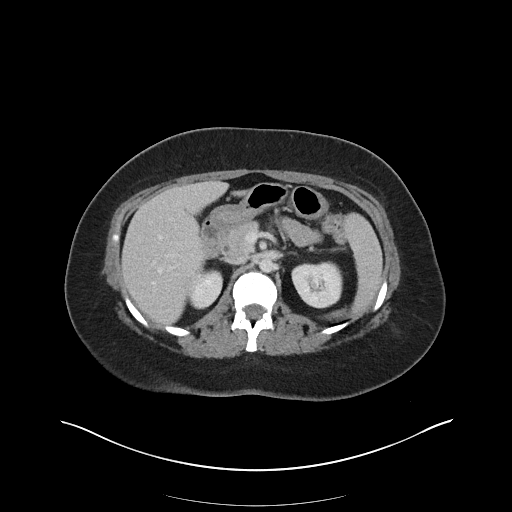
[im 73/93  soft-tissue]
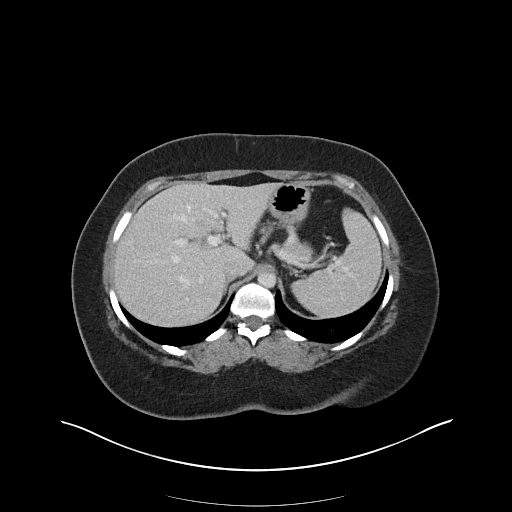
[im 81/93  soft-tissue]
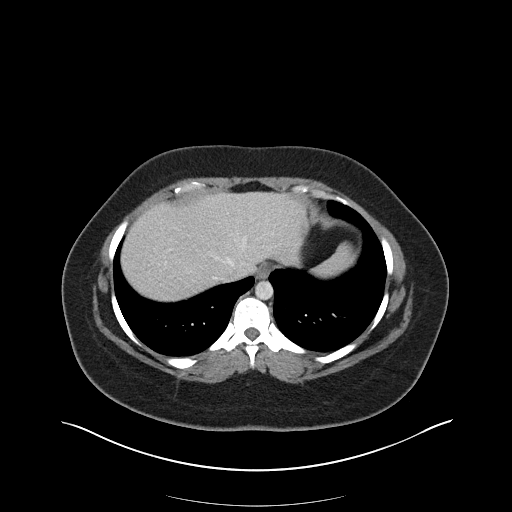
[im 89/93  soft-tissue]
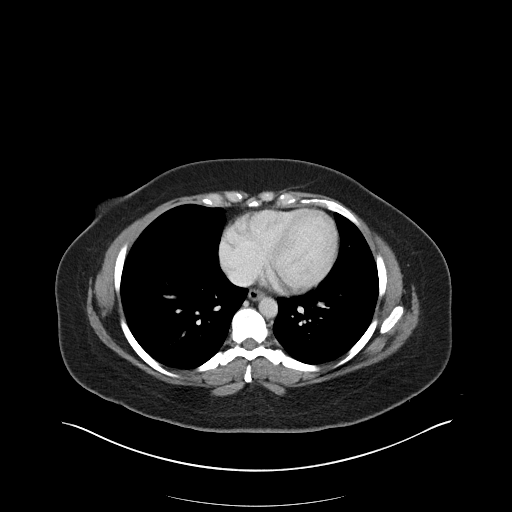

[Series 6: coronal soft tissue · coronal · 0.91mm/px · 3 of 113 slices shown]
[im 38/113  soft-tissue]
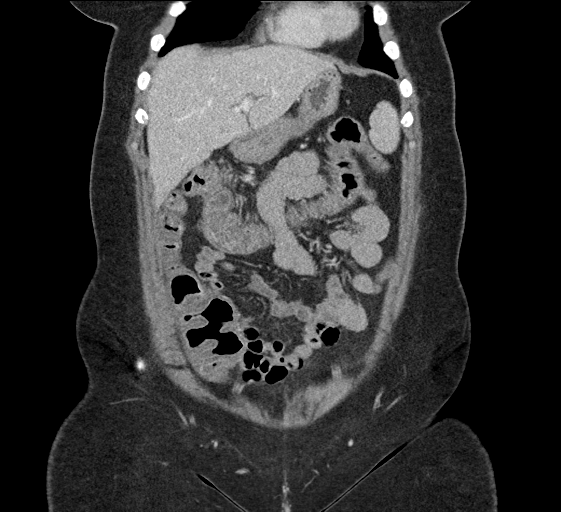
[im 50/113  soft-tissue]
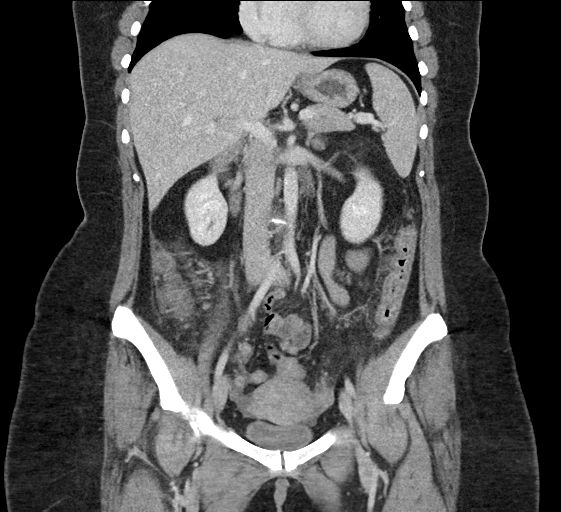
[im 63/113  soft-tissue]
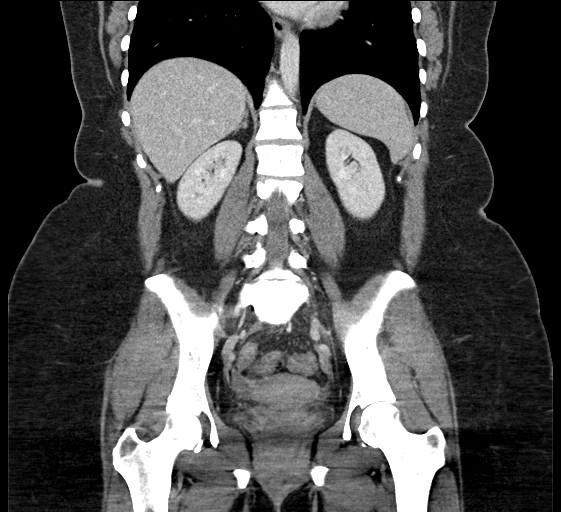

[16 of 46 positions shown; findings below may reference images not displayed]

FINDINGS: Lower chest: The lung bases are clear.

Hepatobiliary: No focal liver abnormality is seen. Status post
cholecystectomy. No biliary dilatation.

Pancreas: No ductal dilatation or inflammation.

Spleen: Normal in size without focal abnormality. Small splenules
anteriorly.

Adrenals/Urinary Tract: Normal adrenal glands. No hydronephrosis or
perinephric edema. Urinary bladder is only minimally distended, no
bladder wall thickening.

Stomach/Bowel: Diffuse colonic wall thickening, most prominent
involving the cecum and ascending colon with associated pericolonic
edema and soft tissue stranding. Descending and sigmoid colon are
involved to a lesser extent. The appendix contains minimal fluid and
is thick walled, similar in degree to the colonic inflammatory
change. No definite terminal ileal or small bowel inflammation. The
stomach is nondistended.

Vascular/Lymphatic: Prominent ileal colic lymph nodes. No
retroperitoneal adenopathy. No pelvic sidewall adenopathy. No acute
vascular abnormality. Portal and mesenteric veins are patent. Normal
caliber abdominal aorta.

Reproductive: Uterus and bilateral adnexa are unremarkable.

Other: Small amount of free fluid in the pelvis and right pericolic
gutter. No free air or perforation. No intra-abdominal abscess.

Musculoskeletal: There are no acute or suspicious osseous
abnormalities. Multiple bone islands scattered throughout the bony
pelvis.
IMPRESSION: 1. Colitis affecting the entire colon, most prominent involving the
cecum, ascending and proximal transverse colon. Findings may be
infectious or inflammatory bowel disease. No definite terminal ileal
or small bowel inflammation.
2. Appendiceal inflammation is similar to that of the adjacent cecum
and likely secondary to the underlying diffuse colonic process
rather than primary appendicitis.

## 2018-11-28 ENCOUNTER — Other Ambulatory Visit: Payer: Self-pay | Admitting: Primary Care

## 2018-11-28 DIAGNOSIS — Z3041 Encounter for surveillance of contraceptive pills: Secondary | ICD-10-CM

## 2018-12-19 ENCOUNTER — Ambulatory Visit (INDEPENDENT_AMBULATORY_CARE_PROVIDER_SITE_OTHER): Payer: BC Managed Care – PPO | Admitting: Primary Care

## 2018-12-19 DIAGNOSIS — F411 Generalized anxiety disorder: Secondary | ICD-10-CM | POA: Diagnosis not present

## 2018-12-19 MED ORDER — BUPROPION HCL ER (SR) 150 MG PO TB12: 150.0000 mg | ORAL_TABLET | Freq: Two times a day (BID) | ORAL | 0 refills | Status: DC

## 2018-12-19 NOTE — Progress Notes (Signed)
Subjective:    Patient ID: Casey Pope, female    DOB: [birth year], 33 y.o.   MRN: [id number]  HPI  Virtual Visit via Video Note  I connected with Casey Pope on 12/19/18 at  2:20 PM EDT by a video enabled telemedicine application and verified that I am speaking with the correct person using two identifiers.  Location: Patient: Home Provider: Office   I discussed the limitations of evaluation and management by telemedicine and the availability of in person appointments. The patient expressed understanding and agreed to proceed.  History of Present Illness:  [name] is a 33 year old female with a history of migraines, anxiety who presents today to discuss anxiety.  Currently managed on Wellbutrin SR 100 mg BID and hydroxyzine 25 mg PRN.   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 Also attempting to assist both of her children with their virtual schooling from home. Her husband is working in the office so she is alone with the children at home all day long and is struggling.    Symptoms include difficulty sleeping, chest tightness, feeling anxious/nervous which have been increased over the last one month since school started. She's been taking her hydroxyzine at bedtime to help with anxiety and sleep.   She does have counseling available to her through [employer] at her occupation but has not yet contacted their office.   Observations/Objective:  Alert and oriented. Appears well, not sickly. No distress. Speaking in complete sentences.   Assessment and Plan:  Increased anxiety due to situation with working from home and helping to school both of her children at home.  Discussed options for treatment, recommended she connect with counseling through [employer]. Increase dose of Wellbutrin to 150 mg BID. Continue hydroxyzine PRN HS. She will update in 4 weeks.  Follow Up Instructions:  We've increased the dose of your bupropion to 150 mg twice daily.   Continue to use the hydroxyzine at bedtime as needed for sleep and anxiety.  Please update me in one month.  It was a pleasure to see you today! Allie Bossier, NP-C    I discussed the assessment and treatment plan with the patient. The patient was provided an opportunity to ask questions and all were answered. The patient agreed with the plan and demonstrated an understanding of the instructions.   The patient was advised to call back or seek an in-person evaluation if the symptoms worsen or if the condition fails to improve as anticipated.   Pleas Koch, NP    Review of Systems  Respiratory: Negative for shortness of breath.   Cardiovascular: Negative for chest pain.  Psychiatric/Behavioral:       See HPI       Past Medical History:  Diagnosis Date  . Asthma    as a child - rarely uses inhaler  . C. difficile colitis 12/01/2016  . Colitis 11/29/2016  . Disease of appendix   . Lower back pain    ? herniated disc  . Plantar fasciitis   . Rectal bleeding 03/11/2018     Social History   Socioeconomic History  . Marital status: Married    Spouse name: Not on file  . Number of children: Not on file  . Years of education: Not on file  . Highest education level: Not on file  Occupational History  . Not on file  Social Needs  . Financial resource strain: Not on file  . Food insecurity  Worry: Not on file    Inability: Not on file  . Transportation needs    Medical: Not on file    Non-medical: Not on file  Tobacco Use  . Smoking status: Never Smoker  . Smokeless tobacco: Never Used  Substance and Sexual Activity  . Alcohol use: Yes    Alcohol/week: 0.0 standard drinks    Comment: socially but none with pregnancy  . Drug use: No  . Sexual activity: Yes    Birth control/protection: None  Lifestyle  . Physical activity    Days per week: Not on file    Minutes per session: Not on file  . Stress: Not on file  Relationships  . Social Musician on  phone: Not on file    Gets together: Not on file    Attends religious service: Not on file    Active member of club or organization: Not on file    Attends meetings of clubs or organizations: Not on file    Relationship status: Not on file  . Intimate partner violence    Fear of current or ex partner: Not on file    Emotionally abused: Not on file    Physically abused: Not on file    Forced sexual activity: Not on file  Other Topics Concern  . Not on file  Social History Narrative   Married.   Has 3 three children.   Completed Masters degree, works as a Runner, broadcasting/film/video.   Enjoys spending time with her family.    Past Surgical History:  Procedure Laterality Date  . CESAREAN SECTION    . CESAREAN SECTION WITH BILATERAL TUBAL LIGATION Bilateral 02/27/2014   Procedure: CESAREAN SECTION WITH BILATERAL TUBAL LIGATION;  Surgeon: Mitchel Honour, DO;  Location: [facility];  Service: Obstetrics;  Laterality: Bilateral;  Repeat  edc 03/06/14  . CHOLECYSTECTOMY    . WISDOM TOOTH EXTRACTION      Family History  Problem Relation Age of Onset  . Hypertension Mother   . Diabetes Mother   . Arthritis Father   . Cancer Maternal Grandfather        lung  . Inflammatory bowel disease Neg Hx     Allergies  Allergen Reactions  . Sulfa Antibiotics Hives  . Ciprofloxacin   . Clindamycin/Lincomycin Other (See Comments)    C-Diff  . Amoxicillin Rash  . Nickel Rash    Current Outpatient Medications on File Prior to Visit  Medication Sig Dispense Refill  . hydrOXYzine (ATARAX/VISTARIL) 25 MG tablet Take 1 tablet (25 mg total) by mouth 2 (two) times daily as needed for anxiety. 60 tablet 0  . Levonorgestrel-Ethinyl Estradiol (AMETHIA) 0.15-0.03 &0.01 MG tablet TAKE 1 TABLET BY MOUTH EVERY DAY 91 tablet 2  . rizatriptan (MAXALT) 10 MG tablet Take 10 mg by mouth as needed.     . topiramate (TOPAMAX) 50 MG tablet Take 1 tablet in the morning and 2 tablets at night for headache prevention. 270 tablet 3   No  current facility-administered medications on file prior to visit.     There were no vitals taken for this visit.   Objective:   Physical Exam  Constitutional: She is oriented to person, place, and time. She appears well-nourished.  Respiratory: Effort normal.  Neurological: She is alert and oriented to person, place, and time.  Psychiatric: She has a normal mood and affect.           Assessment & Plan:

## 2018-12-19 NOTE — Assessment & Plan Note (Signed)
  Increased anxiety due to situation with working from home and helping to school both of her children at home.  Discussed options for treatment, recommended she connect with counseling through [employer]. Increase dose of Wellbutrin to 150 mg BID. Continue hydroxyzine PRN HS. She will update in 4 weeks

## 2018-12-19 NOTE — Patient Instructions (Signed)
We've increased the dose of your bupropion to 150 mg twice daily.  Continue to use the hydroxyzine at bedtime as needed for sleep and anxiety.  Please update me in one month.  It was a pleasure to see you today! Allie Bossier, NP-C

## 2019-05-28 ENCOUNTER — Ambulatory Visit: Payer: BC Managed Care – PPO | Attending: Internal Medicine

## 2019-05-28 DIAGNOSIS — Z23 Encounter for immunization: Secondary | ICD-10-CM | POA: Insufficient documentation

## 2019-05-28 NOTE — Progress Notes (Signed)
   Covid-19 Vaccination Clinic  Name:  Casey Pope    MRN: [id number] DOB: 03/19/1986  05/28/2019  [name] was observed post Covid-19 immunization for 15 minutes without incidence. She was provided with Vaccine Information Sheet and instruction to access the V-Safe system.   [name] was instructed to call 911 with any severe reactions post vaccine: Marland Kitchen Difficulty breathing  . Swelling of your face and throat  . A fast heartbeat  . A bad rash all over your body  . Dizziness and weakness    Immunizations Administered    Name Date Dose VIS Date Route   Pfizer COVID-19 Vaccine 05/28/2019 10:24 AM 0.3 mL 03/10/2019 Intramuscular   Manufacturer: [employer], [employer]   Lot: [id number]   NDC: [address]-3

## 2019-06-06 ENCOUNTER — Telehealth: Payer: Self-pay | Admitting: *Deleted

## 2019-06-06 NOTE — Telephone Encounter (Signed)
Patient called stating that she has questions about her birth control medication. Patient stated that she has been taking this for a long time.Patient stated that she takes it every 3 months and she did not start her period when she was suppose to last week. Patient stated that she has had a change in her diet. Patient stated that she has not started her new pak of birth control which she was suppose to start Sunday. Patient stated that she has cut out sugars and carbs in her diet. Patient stated that she has lost 15 pounds and not sure if that would have anything to do with this.  Patient requested a call back.

## 2019-06-07 NOTE — Telephone Encounter (Signed)
Please congratulate patient on weight loss, that's great news! I don't believe that the weight loss is causing her missed period.   She may want to double check a pregnancy test first. If negative then she should resume her pill packs, since this is one isolated event. She may have breakthrough bleeding since she's missed a few days.  If she continues to go through another three month pill pack without a period in the end then have her update [place].

## 2019-06-08 ENCOUNTER — Other Ambulatory Visit: Payer: Self-pay | Admitting: Primary Care

## 2019-06-08 DIAGNOSIS — F411 Generalized anxiety disorder: Secondary | ICD-10-CM

## 2019-06-08 NOTE — Telephone Encounter (Signed)
Spoken and notified patient of Casey Pope's comments. Patient verbalized understanding.  

## 2019-06-19 ENCOUNTER — Ambulatory Visit: Payer: BC Managed Care – PPO | Attending: Internal Medicine

## 2019-06-19 DIAGNOSIS — Z23 Encounter for immunization: Secondary | ICD-10-CM

## 2019-06-19 NOTE — Progress Notes (Signed)
   Covid-19 Vaccination Clinic  Name:  Casey Pope    MRN: [id number] DOB: 15-Dec-1985  06/19/2019  [name] was observed post Covid-19 immunization for 15 minutes without incident. She was provided with Vaccine Information Sheet and instruction to access the V-Safe system.   [name] was instructed to call 911 with any severe reactions post vaccine: Marland Kitchen Difficulty breathing  . Swelling of face and throat  . A fast heartbeat  . A bad rash all over body  . Dizziness and weakness   Immunizations Administered    Name Date Dose VIS Date Route   Pfizer COVID-19 Vaccine 06/19/2019  9:33 AM 0.3 mL 03/10/2019 Intramuscular   Manufacturer: [employer], [employer]   Lot: [id number]   NDC: [address]-7

## 2019-09-02 ENCOUNTER — Other Ambulatory Visit: Payer: Self-pay | Admitting: Primary Care

## 2019-09-02 DIAGNOSIS — F411 Generalized anxiety disorder: Secondary | ICD-10-CM

## 2019-09-03 ENCOUNTER — Other Ambulatory Visit: Payer: Self-pay | Admitting: Primary Care

## 2019-09-03 DIAGNOSIS — Z3041 Encounter for surveillance of contraceptive pills: Secondary | ICD-10-CM

## 2019-10-17 DIAGNOSIS — F411 Generalized anxiety disorder: Secondary | ICD-10-CM

## 2019-11-21 ENCOUNTER — Ambulatory Visit (INDEPENDENT_AMBULATORY_CARE_PROVIDER_SITE_OTHER): Payer: BC Managed Care – PPO | Admitting: Psychology

## 2019-11-21 DIAGNOSIS — F411 Generalized anxiety disorder: Secondary | ICD-10-CM

## 2019-11-29 ENCOUNTER — Telehealth: Payer: Self-pay | Admitting: Primary Care

## 2019-11-29 DIAGNOSIS — R519 Headache, unspecified: Secondary | ICD-10-CM

## 2019-11-29 MED ORDER — TOPIRAMATE 50 MG PO TABS: ORAL_TABLET | ORAL | 0 refills | Status: DC

## 2019-11-29 NOTE — Telephone Encounter (Signed)
Medication Refill - Medication:  topiramate (TOPAMAX) 50 MG tablet   Has the patient contacted their pharmacy?  Yes stated no refills and to call office.   Preferred Pharmacy (with phone number or street name):  [facility] [address] [place], [place] [address]

## 2019-11-29 NOTE — Telephone Encounter (Signed)
Called patient and scheduled OV for 9/13. Pt will schedule cpe at later time.

## 2019-11-29 NOTE — Telephone Encounter (Signed)
I will provide her with a 30 day supply, but she'll need a follow up visit/CPE for further refills. Please schedule.

## 2019-12-11 ENCOUNTER — Other Ambulatory Visit: Payer: Self-pay

## 2019-12-11 ENCOUNTER — Encounter: Payer: Self-pay | Admitting: Primary Care

## 2019-12-11 ENCOUNTER — Telehealth (INDEPENDENT_AMBULATORY_CARE_PROVIDER_SITE_OTHER): Payer: BC Managed Care – PPO | Admitting: Primary Care

## 2019-12-11 ENCOUNTER — Ambulatory Visit (INDEPENDENT_AMBULATORY_CARE_PROVIDER_SITE_OTHER): Payer: BC Managed Care – PPO | Admitting: Psychology

## 2019-12-11 DIAGNOSIS — F411 Generalized anxiety disorder: Secondary | ICD-10-CM | POA: Diagnosis not present

## 2019-12-11 DIAGNOSIS — R519 Headache, unspecified: Secondary | ICD-10-CM | POA: Diagnosis not present

## 2019-12-11 MED ORDER — TOPIRAMATE 50 MG PO TABS: ORAL_TABLET | ORAL | 3 refills | Status: DC

## 2019-12-11 MED ORDER — SUMATRIPTAN SUCCINATE 50 MG PO TABS: ORAL_TABLET | ORAL | 0 refills | Status: DC

## 2019-12-11 MED ORDER — BUPROPION HCL ER (SR) 150 MG PO TB12: 150.0000 mg | ORAL_TABLET | Freq: Two times a day (BID) | ORAL | 3 refills | Status: DC

## 2019-12-11 NOTE — Assessment & Plan Note (Signed)
Overall seems to be doing well on bupropion twice daily, continue same.  Refill sent to pharmacy.  Continue therapy.

## 2019-12-11 NOTE — Progress Notes (Signed)
Subjective:    Patient ID: Casey Pope, female    DOB: 17-Oct-1985, 34 y.o.   MRN: [id number]  HPI  Virtual Visit via Video Note  I connected with Casey Pope on 12/11/19 at  3:00 PM EDT by a video enabled telemedicine application and verified that I am speaking with the correct person using two identifiers.  Location: Patient: Work Provider: Office Participants: Patient and myself   I discussed the limitations of evaluation and management by telemedicine and the availability of in person appointments. The patient expressed understanding and agreed to proceed.  History of Present Illness:  Casey Pope is a 34 year old female with a history of GAD, frequent headaches, obesity, hemorrhoids who presents today for follow up and medication refills.  1) Frequent Headaches: Currently managed on topiramate 50 mg and takes 1 tablet in the morning and 2 tablets HS. She is also managed on rizatriptan 10 mg PRN. She would like to switch back to Imitrex as she found it to be more effective. She's using abortive migraine treatment once every 3 months on average.  Overall feels well managed on her current regimen.  2) GAD: Currently managed on bupropion SR 150 mg BID.  Overall feels well managed, does have good days and bad days.  She is established with therapy and has completed 1 session, has her second session this afternoon.   Observations/Objective:  Alert and oriented. Appears well, not sickly. No distress. Speaking in complete sentences.   Assessment and Plan:  See problem based charting  Follow Up Instructions:  Please set up a physical for later this year, you are due for your Pap smear.  I will refill all of your medications as discussed.  It was a pleasure to see you today! Mayra Reel, NP-C    I discussed the assessment and treatment plan with the patient. The patient was provided an opportunity to ask questions and all were answered.  The patient agreed with the plan and demonstrated an understanding of the instructions.   The patient was advised to call back or seek an in-person evaluation if the symptoms worsen or if the condition fails to improve as anticipated.    Doreene Nest, NP    Review of Systems  Neurological: Negative for headaches.  Psychiatric/Behavioral: The patient is not nervous/anxious.        Overall feels well managed on bupropion.       Past Medical History:  Diagnosis Date  . Asthma    as a child - rarely uses inhaler  . C. difficile colitis 12/01/2016  . Colitis 11/29/2016  . Disease of appendix   . Lower back pain    ? herniated disc  . Plantar fasciitis   . Rectal bleeding 03/11/2018     Social History   Socioeconomic History  . Marital status: Married    Spouse name: Not on file  . Number of children: Not on file  . Years of education: Not on file  . Highest education level: Not on file  Occupational History  . Not on file  Tobacco Use  . Smoking status: Never Smoker  . Smokeless tobacco: Never Used  Substance and Sexual Activity  . Alcohol use: Yes    Alcohol/week: 0.0 standard drinks    Comment: socially but none with pregnancy  . Drug use: No  . Sexual activity: Yes    Birth control/protection: None  Other Topics Concern  . Not on file  Social History Narrative   Married.   Has 3 three children.   Completed Masters degree, works as a Runner, broadcasting/film/video.   Enjoys spending time with her family.   Social Determinants of Health   Financial Resource Strain:   . Difficulty of Paying Living Expenses: Not on file  Food Insecurity:   . Worried About Programme researcher, broadcasting/film/video in the Last Year: Not on file  . Ran Out of Food in the Last Year: Not on file  Transportation Needs:   . Lack of Transportation (Medical): Not on file  . Lack of Transportation (Non-Medical): Not on file  Physical Activity:   . Days of Exercise per Week: Not on file  . Minutes of Exercise per Session:  Not on file  Stress:   . Feeling of Stress : Not on file  Social Connections:   . Frequency of Communication with Friends and Family: Not on file  . Frequency of Social Gatherings with Friends and Family: Not on file  . Attends Religious Services: Not on file  . Active Member of Clubs or Organizations: Not on file  . Attends Banker Meetings: Not on file  . Marital Status: Not on file  Intimate Partner Violence:   . Fear of Current or Ex-Partner: Not on file  . Emotionally Abused: Not on file  . Physically Abused: Not on file  . Sexually Abused: Not on file    Past Surgical History:  Procedure Laterality Date  . CESAREAN SECTION    . CESAREAN SECTION WITH BILATERAL TUBAL LIGATION Bilateral 02/27/2014   Procedure: CESAREAN SECTION WITH BILATERAL TUBAL LIGATION;  Surgeon: Mitchel Honour, DO;  Location: [facility];  Service: Obstetrics;  Laterality: Bilateral;  Repeat  edc 03/06/14  . CHOLECYSTECTOMY    . WISDOM TOOTH EXTRACTION      Family History  Problem Relation Age of Onset  . Hypertension Mother   . Diabetes Mother   . Arthritis Father   . Cancer Maternal Grandfather        lung  . Inflammatory bowel disease Neg Hx     Allergies  Allergen Reactions  . Sulfa Antibiotics Hives  . Ciprofloxacin   . Clindamycin/Lincomycin Other (See Comments)    C-Diff  . Amoxicillin Rash  . Nickel Rash    Current Outpatient Medications on File Prior to Visit  Medication Sig Dispense Refill  . Levonorgestrel-Ethinyl Estradiol (AMETHIA) 0.15-0.03 &0.01 MG tablet TAKE 1 TABLET BY MOUTH EVERY DAY 91 tablet 1  . rizatriptan (MAXALT) 10 MG tablet Take 10 mg by mouth as needed.      No current facility-administered medications on file prior to visit.    Ht 5\' 4"  (1.626 m)   Wt 208 lb (94.3 kg)   BMI 35.70 kg/m    Objective:   Physical Exam Constitutional:      Appearance: Normal appearance.  Pulmonary:     Effort: Pulmonary effort is normal.  Neurological:     Mental  Status: She is alert and oriented to person, place, and time.  Psychiatric:        Mood and Affect: Mood normal.            Assessment & Plan:

## 2019-12-11 NOTE — Assessment & Plan Note (Signed)
Feels well managed on current regimen, continue topiramate 50 mg as prescribed.  Refill sent to pharmacy.  Will change to sumatriptan per patient request.

## 2019-12-18 DIAGNOSIS — F411 Generalized anxiety disorder: Secondary | ICD-10-CM

## 2019-12-21 MED ORDER — BUPROPION HCL ER (SR) 200 MG PO TB12: 200.0000 mg | ORAL_TABLET | Freq: Two times a day (BID) | ORAL | 0 refills | Status: DC

## 2019-12-25 ENCOUNTER — Ambulatory Visit (INDEPENDENT_AMBULATORY_CARE_PROVIDER_SITE_OTHER): Payer: BC Managed Care – PPO | Admitting: Psychology

## 2019-12-25 DIAGNOSIS — F411 Generalized anxiety disorder: Secondary | ICD-10-CM | POA: Diagnosis not present

## 2020-01-08 ENCOUNTER — Ambulatory Visit (INDEPENDENT_AMBULATORY_CARE_PROVIDER_SITE_OTHER): Payer: BC Managed Care – PPO | Admitting: Psychology

## 2020-01-08 DIAGNOSIS — F411 Generalized anxiety disorder: Secondary | ICD-10-CM | POA: Diagnosis not present

## 2020-01-15 ENCOUNTER — Ambulatory Visit: Payer: BC Managed Care – PPO | Admitting: Psychology

## 2020-01-15 ENCOUNTER — Other Ambulatory Visit: Payer: Self-pay | Admitting: Primary Care

## 2020-01-15 DIAGNOSIS — R519 Headache, unspecified: Secondary | ICD-10-CM

## 2020-01-15 NOTE — Telephone Encounter (Signed)
Just refilled a month ago ok to refill now?

## 2020-01-16 NOTE — Telephone Encounter (Signed)
No, refill too soon.  

## 2020-01-22 ENCOUNTER — Ambulatory Visit: Payer: BC Managed Care – PPO | Admitting: Psychology

## 2020-02-05 ENCOUNTER — Ambulatory Visit: Payer: BC Managed Care – PPO | Admitting: Psychology

## 2020-02-06 ENCOUNTER — Telehealth: Payer: Self-pay

## 2020-02-06 ENCOUNTER — Telehealth (INDEPENDENT_AMBULATORY_CARE_PROVIDER_SITE_OTHER): Payer: BC Managed Care – PPO | Admitting: Primary Care

## 2020-02-06 ENCOUNTER — Encounter: Payer: Self-pay | Admitting: Primary Care

## 2020-02-06 DIAGNOSIS — J029 Acute pharyngitis, unspecified: Secondary | ICD-10-CM | POA: Diagnosis not present

## 2020-02-06 NOTE — Assessment & Plan Note (Signed)
Acute URI vs allergy symptoms x 24 hours. Doesn't appear acutely ill at this point, symptoms will likely progress if this is of viral etiology.  Recommended she proceed with a Covid-19 test in order to return to work. Discussed other conservative treatment. Will send my chart message.  Return precautions provided.

## 2020-02-06 NOTE — Progress Notes (Signed)
Subjective:    Patient ID: Casey Pope, female    DOB: 17-May-1985, 34 y.o.   MRN: [id number]  HPI  Virtual Visit via Video Note  I connected with Casey Pope on 02/06/20 at  9:20 AM EST by a video enabled telemedicine application and verified that I am speaking with the correct person using two identifiers.  Location: Patient: Home  Provider: Office Participants: Patient and myself   I discussed the limitations of evaluation and management by telemedicine and the availability of in person appointments. The patient expressed understanding and agreed to proceed.  History of Present Illness:  Ms. Laural Benes is a 34 year old female with a history of GAD, low back pain, frequent headaches who presents today with a chief complaint of sore throat.  Symptoms began yesterday with a sore throat. She woke up this morning and felt like everything is "sitting in my chest". 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  She denies headache, rhinorrhea, sinus pressure, fevers, chills, diarrhea. She is fully vaccinated from Covid-19, but cannot be sure that she wasn't exposed to Covid-19 given her occupation. She will need clearance to return to work.   Observations/Objective:  Alert and oriented. Appears well, not sickly. No distress. Speaking in complete sentences. No cough during visit.  Assessment and Plan:  Acute URI vs allergy symptoms x 24 hours. Doesn't appear acutely ill at this point, symptoms will likely progress if this is of viral etiology.  Recommended she proceed with a Covid-19 test in order to return to work. Discussed other conservative treatment. Will send my chart message.  Return precautions provided.   Follow Up Instructions:  Most of the time these symptoms are caused by a viral infection which will resolve on its own over time. You should be feeling better by day seven of symptoms.  You  can try a few things over the counter to help with your symptoms including:  Cough: Delsym or Robitussin (get the off brand, works just as well) Chest Congestion: Mucinex (plain) Nasal Congestion/Ear Pressure/Sinus Pressure: Try using Flonase (fluticasone) nasal spray. Instill 1 spray in each nostril twice daily. This can be purchased over the counter. Body aches, fevers, headache: Ibuprofen (not to exceed 2400 mg in 24 hours) or Acetaminophen-Tylenol (not to exceed 3000 mg in 24 hours) Runny Nose/Throat Drainage/Sneezing/Itchy or Watery Eyes: An antihistamine such as Zyrtec, Claritin, Xyzal, Allegra  Please go get Covid-19 tested.  It was a pleasure to see you today! Mayra Reel, NP-C     I discussed the assessment and treatment plan with the patient. The patient was provided an opportunity to ask questions and all were answered. The patient agreed with the plan and demonstrated an understanding of the instructions.   The patient was advised to call back or seek an in-person evaluation if the symptoms worsen or if the condition fails to improve as anticipated.   Doreene Nest, NP    Review of Systems  Constitutional: Negative for chills, fatigue and fever.  HENT: Positive for congestion and sore throat. Negative for ear pain, postnasal drip and sinus pressure.   Respiratory: Negative for cough.   Gastrointestinal: Negative for diarrhea.  Allergic/Immunologic: Negative for environmental allergies.       Past Medical History:  Diagnosis Date  . Asthma    as a child - rarely uses inhaler  . C. difficile colitis 12/01/2016  . Colitis 11/29/2016  .  Disease of appendix   . Lower back pain    ? herniated disc  . Plantar fasciitis   . Rectal bleeding 03/11/2018     Social History   Socioeconomic History  . Marital status: Married    Spouse name: Not on file  . Number of children: Not on file  . Years of education: Not on file  . Highest education level: Not on file    Occupational History  . Not on file  Tobacco Use  . Smoking status: Never Smoker  . Smokeless tobacco: Never Used  Substance and Sexual Activity  . Alcohol use: Yes    Alcohol/week: 0.0 standard drinks    Comment: socially but none with pregnancy  . Drug use: No  . Sexual activity: Yes    Birth control/protection: None  Other Topics Concern  . Not on file  Social History Narrative   Married.   Has 3 three children.   Completed Masters degree, works as a Runner, broadcasting/film/video.   Enjoys spending time with her family.   Social Determinants of Health   Financial Resource Strain:   . Difficulty of Paying Living Expenses: Not on file  Food Insecurity:   . Worried About Programme researcher, broadcasting/film/video in the Last Year: Not on file  . Ran Out of Food in the Last Year: Not on file  Transportation Needs:   . Lack of Transportation (Medical): Not on file  . Lack of Transportation (Non-Medical): Not on file  Physical Activity:   . Days of Exercise per Week: Not on file  . Minutes of Exercise per Session: Not on file  Stress:   . Feeling of Stress : Not on file  Social Connections:   . Frequency of Communication with Friends and Family: Not on file  . Frequency of Social Gatherings with Friends and Family: Not on file  . Attends Religious Services: Not on file  . Active Member of Clubs or Organizations: Not on file  . Attends Banker Meetings: Not on file  . Marital Status: Not on file  Intimate Partner Violence:   . Fear of Current or Ex-Partner: Not on file  . Emotionally Abused: Not on file  . Physically Abused: Not on file  . Sexually Abused: Not on file    Past Surgical History:  Procedure Laterality Date  . CESAREAN SECTION    . CESAREAN SECTION WITH BILATERAL TUBAL LIGATION Bilateral 02/27/2014   Procedure: CESAREAN SECTION WITH BILATERAL TUBAL LIGATION;  Surgeon: Mitchel Honour, DO;  Location: [facility];  Service: Obstetrics;  Laterality: Bilateral;  Repeat  edc 03/06/14  .  CHOLECYSTECTOMY    . WISDOM TOOTH EXTRACTION      Family History  Problem Relation Age of Onset  . Hypertension Mother   . Diabetes Mother   . Arthritis Father   . Cancer Maternal Grandfather        lung  . Inflammatory bowel disease Neg Hx     Allergies  Allergen Reactions  . Sulfa Antibiotics Hives  . Ciprofloxacin   . Clindamycin/Lincomycin Other (See Comments)    C-Diff  . Amoxicillin Rash  . Nickel Rash    Current Outpatient Medications on File Prior to Visit  Medication Sig Dispense Refill  . buPROPion (WELLBUTRIN SR) 200 MG 12 hr tablet Take 1 tablet (200 mg total) by mouth 2 (two) times daily. For anxiety and depression. 180 tablet 0  . Levonorgestrel-Ethinyl Estradiol (AMETHIA) 0.15-0.03 &0.01 MG tablet TAKE 1 TABLET BY MOUTH EVERY  DAY 91 tablet 1  . SUMAtriptan (IMITREX) 50 MG tablet Take 1 tablet by mouth at migraine onset, may repeat in 2 hours if headache persists or recurs. 10 tablet 0  . topiramate (TOPAMAX) 50 MG tablet Take 1 tablet in the morning and 2 tablets at night for headache prevention. 270 tablet 3   No current facility-administered medications on file prior to visit.    Ht 5\' 4"  (1.626 m)   Wt 208 lb (94.3 kg)   BMI 35.70 kg/m    Objective:   Physical Exam Constitutional:      General: She is not in acute distress.    Appearance: She is not ill-appearing.  Pulmonary:     Effort: Pulmonary effort is normal.     Comments: No cough during visit Neurological:     Mental Status: She is alert and oriented to person, place, and time.  Psychiatric:        Mood and Affect: Mood normal.            Assessment & Plan:

## 2020-02-06 NOTE — Patient Instructions (Signed)
Most of the time these symptoms are caused by a viral infection which will resolve on its own over time. You should be feeling better by day seven of symptoms.  You can try a few things over the counter to help with your symptoms including:  Cough: Delsym or Robitussin (get the off brand, works just as well) Chest Congestion: Mucinex (plain) Nasal Congestion/Ear Pressure/Sinus Pressure: Try using Flonase (fluticasone) nasal spray. Instill 1 spray in each nostril twice daily. This can be purchased over the counter. Body aches, fevers, headache: Ibuprofen (not to exceed 2400 mg in 24 hours) or Acetaminophen-Tylenol (not to exceed 3000 mg in 24 hours) Runny Nose/Throat Drainage/Sneezing/Itchy or Watery Eyes: An antihistamine such as Zyrtec, Claritin, Xyzal, Allegra  Please go get Covid-19 tested.  It was a pleasure to see you today! Mayra Reel, NP-C

## 2020-02-06 NOTE — Telephone Encounter (Signed)
[facility] Night - Client Nonclinical Telephone Record AccessNurse Client [facility] Night - Client Client Site [facility] - Night Contact Type Call Who Is Calling Patient / Member / Family / Caregiver Caller Name Sarinah Doetsch Caller Phone Number [phone] Patient Name Casey Pope Patient DOB 1986/02/10 Call Type Message Only Information Provided Reason for Call Request to Schedule Office Appointment Initial Comment Caller requesting return call to schedule appointment. She has upper respitory stuff. No triage Additional Comment Office hours were provided. Disp. Time Disposition Final User 02/06/2020 6:59:00 AM General Information Provided Yes Leotis Shames Call Closed By: Leotis Shames Transaction Date/Time: 02/06/2020 6:57:11 AM (ET)

## 2020-02-06 NOTE — Telephone Encounter (Signed)
Pt already has video visit today at 9:20 with Allayne Gitelman NP.

## 2020-02-25 ENCOUNTER — Other Ambulatory Visit: Payer: Self-pay | Admitting: Primary Care

## 2020-02-25 DIAGNOSIS — Z3041 Encounter for surveillance of contraceptive pills: Secondary | ICD-10-CM

## 2020-02-26 NOTE — Telephone Encounter (Signed)
Pharmacy requests refill on: Levono-E Estrad 0.15-0.03-0.01  LAST REFILL: 09/04/2019 (Q-91, R-1) LAST OV: 12/18/2019 NEXT OV: Not Scheduled  PHARMACY: [facility] [address] [place]

## 2020-02-29 NOTE — Telephone Encounter (Signed)
Called patient to schedule cpe. Pt was at work and stated she will call back to schedule.

## 2020-03-16 ENCOUNTER — Other Ambulatory Visit: Payer: Self-pay | Admitting: Primary Care

## 2020-03-16 DIAGNOSIS — F411 Generalized anxiety disorder: Secondary | ICD-10-CM

## 2020-03-18 NOTE — Telephone Encounter (Signed)
Please Advise

## 2020-03-18 NOTE — Telephone Encounter (Signed)
Refills sent to pharmacy. 

## 2020-03-19 ENCOUNTER — Ambulatory Visit (INDEPENDENT_AMBULATORY_CARE_PROVIDER_SITE_OTHER): Payer: BC Managed Care – PPO | Admitting: Primary Care

## 2020-03-19 ENCOUNTER — Other Ambulatory Visit (HOSPITAL_COMMUNITY)
Admission: RE | Admit: 2020-03-19 | Discharge: 2020-03-19 | Disposition: A | Discharge status: Home / Self Care | Payer: BC Managed Care – PPO | Source: Ambulatory Visit | Attending: Primary Care | Admitting: Primary Care

## 2020-03-19 ENCOUNTER — Other Ambulatory Visit: Payer: Self-pay

## 2020-03-19 ENCOUNTER — Encounter: Payer: Self-pay | Admitting: Primary Care

## 2020-03-19 VITALS — BP 114/62 | HR 78 | Temp 97.6°F | Ht 64.0 in | Wt 209.0 lb

## 2020-03-19 DIAGNOSIS — Z0001 Encounter for general adult medical examination with abnormal findings: Secondary | ICD-10-CM | POA: Diagnosis not present

## 2020-03-19 DIAGNOSIS — Z1159 Encounter for screening for other viral diseases: Secondary | ICD-10-CM

## 2020-03-19 DIAGNOSIS — R519 Headache, unspecified: Secondary | ICD-10-CM

## 2020-03-19 DIAGNOSIS — F411 Generalized anxiety disorder: Secondary | ICD-10-CM

## 2020-03-19 DIAGNOSIS — Z3041 Encounter for surveillance of contraceptive pills: Secondary | ICD-10-CM | POA: Insufficient documentation

## 2020-03-19 DIAGNOSIS — R3915 Urgency of urination: Secondary | ICD-10-CM | POA: Diagnosis not present

## 2020-03-19 DIAGNOSIS — Z124 Encounter for screening for malignant neoplasm of cervix: Secondary | ICD-10-CM | POA: Diagnosis present

## 2020-03-19 LAB — CBC
HCT: 42.4 % (ref 36.0–46.0)
Hemoglobin: 14 g/dL (ref 12.0–15.0)
MCHC: 33.1 g/dL (ref 30.0–36.0)
MCV: 93.4 fl (ref 78.0–100.0)
Platelets: 226 10*3/uL (ref 150.0–400.0)
RBC: 4.54 Mil/uL (ref 3.87–5.11)
RDW: 13.3 % (ref 11.5–15.5)
WBC: 6.1 10*3/uL (ref 4.0–10.5)

## 2020-03-19 LAB — POC URINALSYSI DIPSTICK (AUTOMATED)
Bilirubin, UA: NEGATIVE
Glucose, UA: NEGATIVE
Nitrite, UA: POSITIVE
Protein, UA: POSITIVE — AB
Spec Grav, UA: 1.025 (ref 1.010–1.025)
Urobilinogen, UA: 0.2 E.U./dL
pH, UA: 6 (ref 5.0–8.0)

## 2020-03-19 LAB — COMPREHENSIVE METABOLIC PANEL
ALT: 12 U/L (ref 0–35)
AST: 11 U/L (ref 0–37)
Albumin: 4.2 g/dL (ref 3.5–5.2)
Alkaline Phosphatase: 55 U/L (ref 39–117)
BUN: 8 mg/dL (ref 6–23)
CO2: 27 mEq/L (ref 19–32)
Calcium: 8.7 mg/dL (ref 8.4–10.5)
Chloride: 107 mEq/L (ref 96–112)
Creatinine, Ser: 0.79 mg/dL (ref 0.40–1.20)
GFR: 97.22 mL/min (ref >60.00)
Glucose, Bld: 75 mg/dL (ref 70–99)
Potassium: 4.2 mEq/L (ref 3.5–5.1)
Sodium: 138 mEq/L (ref 135–145)
Total Bilirubin: 0.5 mg/dL (ref 0.2–1.2)
Total Protein: 7.1 g/dL (ref 6.0–8.3)

## 2020-03-19 LAB — LIPID PANEL
Cholesterol: 124 mg/dL (ref 0–200)
HDL: 56.8 mg/dL (ref >39.00)
LDL Cholesterol: 48 mg/dL (ref 0–99)
NonHDL: 66.85
Total CHOL/HDL Ratio: 2
Triglycerides: 96 mg/dL (ref 0.0–149.0)
VLDL: 19.2 mg/dL (ref 0.0–40.0)

## 2020-03-19 NOTE — Assessment & Plan Note (Signed)
Acute for 24 hours, improved with increased water intake.   UA today with 3+ leuks, positive nitrites, 2+ blood.   Culture sent.

## 2020-03-19 NOTE — Assessment & Plan Note (Signed)
Well controlled on Topamax 50 mg daily and PRN Imitrex. No recent use of Imitrex. Continue current regimen.

## 2020-03-19 NOTE — Assessment & Plan Note (Signed)
Doing well on bupropion SR 200 mg BID. Continue same.

## 2020-03-19 NOTE — Progress Notes (Signed)
Subjective:    Patient ID: Casey Pope, female    DOB: [birth year], 34 y.o.   MRN: [id number]  HPI  This visit occurred during the SARS-CoV-2 public health emergency.  Safety protocols were in place, including screening questions prior to the visit, additional usage of staff PPE, and extensive cleaning of exam room while observing appropriate contact time as indicated for disinfecting solutions.   [name] is a 34 year old female who presents today for complete physical.  She has also noticed urinary urgency with little release of urine that began yesterday. She admits to little water intake recently, so yesterday she increased her water intake significantly and today symptoms have improved. She denies hematuria, dysuria, pelvic pressure.   Immunizations: -Tetanus: Completed in 2016 -Influenza: Completed this season -Covid-19: Completed series  Diet: She endorses a fair diet.  Exercise: She is not exercising, active at work.  Eye exam: Completed in 2021 Dental exam: Completes semi-annually   Pap Smear: Completed in 2018, due   BP Readings from Last 3 Encounters:  03/19/20 114/62  04/01/18 108/76  03/11/18 116/72     Review of Systems  Constitutional: Negative for unexpected weight change.  HENT: Negative for rhinorrhea.   Eyes: Negative for visual disturbance.  Respiratory: Negative for cough and shortness of breath.   Cardiovascular: Negative for chest pain.  Gastrointestinal: Negative for constipation and diarrhea.  Genitourinary: Positive for urgency. Negative for difficulty urinating.  Musculoskeletal: Negative for arthralgias and myalgias.  Skin: Negative for rash.  Allergic/Immunologic: Negative for environmental allergies.  Neurological: Negative for dizziness, numbness and headaches.  Psychiatric/Behavioral: The patient is not nervous/anxious.        Past Medical History:  Diagnosis Date  . Asthma    as a child - rarely uses inhaler   . C. difficile colitis 12/01/2016  . Colitis 11/29/2016  . Disease of appendix   . Lower back pain    ? herniated disc  . Plantar fasciitis   . Rectal bleeding 03/11/2018     Social History   Socioeconomic History  . Marital status: Married    Spouse name: Not on file  . Number of children: Not on file  . Years of education: Not on file  . Highest education level: Not on file  Occupational History  . Not on file  Tobacco Use  . Smoking status: Never Smoker  . Smokeless tobacco: Never Used  Substance and Sexual Activity  . Alcohol use: Yes    Alcohol/week: 0.0 standard drinks    Comment: socially but none with pregnancy  . Drug use: No  . Sexual activity: Yes    Birth control/protection: None  Other Topics Concern  . Not on file  Social History Narrative   Married.   Has 3 three children.   Completed Masters degree, works as a Runner, broadcasting/film/video.   Enjoys spending time with her family.   Social Determinants of Health   Financial Resource Strain: Not on file  Food Insecurity: Not on file  Transportation Needs: Not on file  Physical Activity: Not on file  Stress: Not on file  Social Connections: Not on file  Intimate Partner Violence: Not on file    Past Surgical History:  Procedure Laterality Date  . CESAREAN SECTION    . CESAREAN SECTION WITH BILATERAL TUBAL LIGATION Bilateral 02/27/2014   Procedure: CESAREAN SECTION WITH BILATERAL TUBAL LIGATION;  Surgeon: Mitchel Honour, DO;  Location: [facility];  Service: Obstetrics;  Laterality: Bilateral;  Repeat  edc 03/06/14  . CHOLECYSTECTOMY    . WISDOM TOOTH EXTRACTION      Family History  Problem Relation Age of Onset  . Hypertension Mother   . Diabetes Mother   . Arthritis Father   . Cancer Maternal Grandfather        lung  . Inflammatory bowel disease Neg Hx     Allergies  Allergen Reactions  . Sulfa Antibiotics Hives  . Ciprofloxacin   . Clindamycin/Lincomycin Other (See Comments)    C-Diff  . Amoxicillin Rash  .  Nickel Rash    Current Outpatient Medications on File Prior to Visit  Medication Sig Dispense Refill  . buPROPion (WELLBUTRIN SR) 200 MG 12 hr tablet TAKE 1 TABLET (200 MG TOTAL) BY MOUTH 2 (TWO) TIMES DAILY. FOR ANXIETY AND DEPRESSION. 180 tablet 2  . Levonorgestrel-Ethinyl Estradiol (AMETHIA) 0.15-0.03 &0.01 MG tablet TAKE 1 TABLET BY MOUTH EVERY DAY 91 tablet 0  . SUMAtriptan (IMITREX) 50 MG tablet Take 1 tablet by mouth at migraine onset, may repeat in 2 hours if headache persists or recurs. 10 tablet 0  . topiramate (TOPAMAX) 50 MG tablet Take 1 tablet in the morning and 2 tablets at night for headache prevention. 270 tablet 3   No current facility-administered medications on file prior to visit.    BP 114/62   Pulse 78   Temp 97.6 F (36.4 C) (Temporal)   Ht 5\' 4"  (1.626 m)   Wt 209 lb (94.8 kg)   LMP 03/11/2020   SpO2 97%   BMI 35.87 kg/m    Objective:   Physical Exam Constitutional:      Appearance: She is well-nourished.  HENT:     Right Ear: Tympanic membrane and ear canal normal.     Left Ear: Tympanic membrane and ear canal normal.     Mouth/Throat:     Mouth: Oropharynx is clear and moist.  Eyes:     Extraocular Movements: EOM normal.     Pupils: Pupils are equal, round, and reactive to light.  Cardiovascular:     Rate and Rhythm: Normal rate and regular rhythm.  Pulmonary:     Effort: Pulmonary effort is normal.     Breath sounds: Normal breath sounds.  Abdominal:     General: Bowel sounds are normal.     Palpations: Abdomen is soft.     Tenderness: There is no abdominal tenderness.  Genitourinary:    Labia:        Right: No tenderness or lesion.        Left: No tenderness or lesion.      Vagina: No vaginal discharge or erythema.     Cervix: No cervical motion tenderness, discharge or erythema.     Uterus: Normal.      Adnexa: Right adnexa normal and left adnexa normal.     Comments: Evidence of non infectious folliculitis to mons  pubis. Musculoskeletal:        General: Normal range of motion.     Cervical back: Neck supple.  Skin:    General: Skin is warm and dry.  Neurological:     Mental Status: She is alert and oriented to person, place, and time.     Cranial Nerves: No cranial nerve deficit.     Deep Tendon Reflexes:     Reflex Scores:      Patellar reflexes are 2+ on the right side and 2+ on the left side. Psychiatric:        Mood and  Affect: Mood and affect and mood normal.            Assessment & Plan:

## 2020-03-19 NOTE — Assessment & Plan Note (Signed)
Doing well on OCP's for which she takes continuously. Continue same.

## 2020-03-19 NOTE — Patient Instructions (Addendum)
Stop by the lab prior to leaving today. I will notify you of your results once received.   Start exercising. You should be getting 150 minutes of moderate intensity exercise weekly.  It's important to improve your diet by reducing consumption of fast food, fried food, processed snack foods, sugary drinks. Increase consumption of fresh vegetables and fruits, whole grains, water.  Ensure you are drinking 64 ounces of water daily.  It was a pleasure to see you today!   Preventive Care 19-14 Years Old, Female Preventive care refers to visits with your health care provider and lifestyle choices that can promote health and wellness. This includes:  A yearly physical exam. This may also be called an annual well check.  Regular dental visits and eye exams.  Immunizations.  Screening for certain conditions.  Healthy lifestyle choices, such as eating a healthy diet, getting regular exercise, not using drugs or products that contain nicotine and tobacco, and limiting alcohol use. What can I expect for my preventive care visit? Physical exam Your health care provider will check your:  Height and weight. This may be used to calculate body mass index (BMI), which tells if you are at a healthy weight.  Heart rate and blood pressure.  Skin for abnormal spots. Counseling Your health care provider may ask you questions about your:  Alcohol, tobacco, and drug use.  Emotional well-being.  Home and relationship well-being.  Sexual activity.  Eating habits.  Work and work Statistician.  Method of birth control.  Menstrual cycle.  Pregnancy history. What immunizations do I need?  Influenza (flu) vaccine  This is recommended every year. Tetanus, diphtheria, and pertussis (Tdap) vaccine  You may need a Td booster every 10 years. Varicella (chickenpox) vaccine  You may need this if you have not been vaccinated. Human papillomavirus (HPV) vaccine  If recommended by your health  care provider, you may need three doses over 6 months. Measles, mumps, and rubella (MMR) vaccine  You may need at least one dose of MMR. You may also need a second dose. Meningococcal conjugate (MenACWY) vaccine  One dose is recommended if you are age 56-21 years and a first-year college student living in a residence hall, or if you have one of several medical conditions. You may also need additional booster doses. Pneumococcal conjugate (PCV13) vaccine  You may need this if you have certain conditions and were not previously vaccinated. Pneumococcal polysaccharide (PPSV23) vaccine  You may need one or two doses if you smoke cigarettes or if you have certain conditions. Hepatitis A vaccine  You may need this if you have certain conditions or if you travel or work in places where you may be exposed to hepatitis A. Hepatitis B vaccine  You may need this if you have certain conditions or if you travel or work in places where you may be exposed to hepatitis B. Haemophilus influenzae type b (Hib) vaccine  You may need this if you have certain conditions. You may receive vaccines as individual doses or as more than one vaccine together in one shot (combination vaccines). Talk with your health care provider about the risks and benefits of combination vaccines. What tests do I need?  Blood tests  Lipid and cholesterol levels. These may be checked every 5 years starting at age 24.  Hepatitis C test.  Hepatitis B test. Screening  Diabetes screening. This is done by checking your blood sugar (glucose) after you have not eaten for a while (fasting).  Sexually transmitted disease (  STD) testing.  BRCA-related cancer screening. This may be done if you have a family history of breast, ovarian, tubal, or peritoneal cancers.  Pelvic exam and Pap test. This may be done every 3 years starting at age 21. Starting at age 30, this may be done every 5 years if you have a Pap test in combination with  an HPV test. Talk with your health care provider about your test results, treatment options, and if necessary, the need for more tests. Follow these instructions at home: Eating and drinking   Eat a diet that includes fresh fruits and vegetables, whole grains, lean protein, and low-fat dairy.  Take vitamin and mineral supplements as recommended by your health care provider.  Do not drink alcohol if: ? Your health care provider tells you not to drink. ? You are pregnant, may be pregnant, or are planning to become pregnant.  If you drink alcohol: ? Limit how much you have to 0-1 drink a day. ? Be aware of how much alcohol is in your drink. In the [place] one drink equals one 12 oz bottle of beer (355 mL), one 5 oz glass of wine (148 mL), or one 1 oz glass of hard liquor (44 mL). Lifestyle  Take daily care of your teeth and gums.  Stay active. Exercise for at least 30 minutes on 5 or more days each week.  Do not use any products that contain nicotine or tobacco, such as cigarettes, e-cigarettes, and chewing tobacco. If you need help quitting, ask your health care provider.  If you are sexually active, practice safe sex. Use a condom or other form of birth control (contraception) in order to prevent pregnancy and STIs (sexually transmitted infections). If you plan to become pregnant, see your health care provider for a preconception visit. What's next?  Visit your health care provider once a year for a well check visit.  Ask your health care provider how often you should have your eyes and teeth checked.  Stay up to date on all vaccines. This information is not intended to replace advice given to you by your health care provider. Make sure you discuss any questions you have with your health care provider. Document Revised: 11/25/2017 Document Reviewed: 11/25/2017 Elsevier Patient Education  2020 Elsevier Inc.  

## 2020-03-19 NOTE — Assessment & Plan Note (Signed)
Immunizations UTD. Pap smear due, completed today. Discussed the importance of a healthy diet and regular exercise in order for weight loss, and to reduce the risk of any potential medical problems.  Exam today as noted. Labs pending.

## 2020-03-20 LAB — HEPATITIS C ANTIBODY
Hepatitis C Ab: NONREACTIVE
SIGNAL TO CUT-OFF: 0 (ref <1.00)

## 2020-03-21 ENCOUNTER — Other Ambulatory Visit: Payer: Self-pay | Admitting: Primary Care

## 2020-03-21 DIAGNOSIS — N3001 Acute cystitis with hematuria: Secondary | ICD-10-CM

## 2020-03-21 LAB — CYTOLOGY - PAP
Comment: NEGATIVE
Diagnosis: NEGATIVE
Diagnosis: REACTIVE
High risk HPV: NEGATIVE

## 2020-03-21 LAB — URINE CULTURE
MICRO NUMBER:: 11343362
SPECIMEN QUALITY:: ADEQUATE

## 2020-03-21 MED ORDER — NITROFURANTOIN MONOHYD MACRO 100 MG PO CAPS: 100.0000 mg | ORAL_CAPSULE | Freq: Two times a day (BID) | ORAL | 0 refills | Status: AC

## 2020-04-20 DIAGNOSIS — J4520 Mild intermittent asthma, uncomplicated: Secondary | ICD-10-CM

## 2020-04-22 MED ORDER — ALBUTEROL SULFATE HFA 108 (90 BASE) MCG/ACT IN AERS: 2.0000 | INHALATION_SPRAY | Freq: Four times a day (QID) | RESPIRATORY_TRACT | 0 refills | Status: DC | PRN

## 2020-06-01 ENCOUNTER — Other Ambulatory Visit: Payer: Self-pay | Admitting: Primary Care

## 2020-06-01 DIAGNOSIS — Z3041 Encounter for surveillance of contraceptive pills: Secondary | ICD-10-CM

## 2021-04-24 ENCOUNTER — Other Ambulatory Visit: Payer: Self-pay | Admitting: Primary Care

## 2021-04-24 DIAGNOSIS — Z3041 Encounter for surveillance of contraceptive pills: Secondary | ICD-10-CM

## 2021-04-24 NOTE — Telephone Encounter (Signed)
Patient has not been seen since December 2021 and will need to be seen for further refills. Needs CPE. Please schedule for ASAP. Let me know once she is scheduled.

## 2021-04-25 NOTE — Telephone Encounter (Signed)
Called patient have made appointment for CPE 05/13/21. She is living in [place] now and can not make the drive. She will be out of pills on 2022/07/31 but had death in the family and request that she have sent in today so that she can pick up before she heads out of town

## 2021-04-25 NOTE — Telephone Encounter (Signed)
Noted, Refill(s) sent to pharmacy.  

## 2021-05-13 ENCOUNTER — Encounter: Payer: Self-pay | Admitting: Primary Care

## 2021-05-13 ENCOUNTER — Other Ambulatory Visit: Payer: Self-pay

## 2021-05-13 ENCOUNTER — Ambulatory Visit (INDEPENDENT_AMBULATORY_CARE_PROVIDER_SITE_OTHER): Payer: BC Managed Care – PPO | Admitting: Primary Care

## 2021-05-13 VITALS — BP 120/84 | HR 88 | Temp 98.0°F | Ht 64.0 in | Wt 245.0 lb

## 2021-05-13 DIAGNOSIS — F411 Generalized anxiety disorder: Secondary | ICD-10-CM | POA: Diagnosis not present

## 2021-05-13 DIAGNOSIS — Z0000 Encounter for general adult medical examination without abnormal findings: Secondary | ICD-10-CM

## 2021-05-13 DIAGNOSIS — Z3041 Encounter for surveillance of contraceptive pills: Secondary | ICD-10-CM

## 2021-05-13 DIAGNOSIS — J45909 Unspecified asthma, uncomplicated: Secondary | ICD-10-CM | POA: Insufficient documentation

## 2021-05-13 DIAGNOSIS — J4520 Mild intermittent asthma, uncomplicated: Secondary | ICD-10-CM

## 2021-05-13 DIAGNOSIS — R519 Headache, unspecified: Secondary | ICD-10-CM | POA: Diagnosis not present

## 2021-05-13 DIAGNOSIS — Z6841 Body Mass Index (BMI) 40.0 and over, adult: Secondary | ICD-10-CM

## 2021-05-13 NOTE — Assessment & Plan Note (Signed)
Decreased overall.  No longer on Topamax, continue off.  Continue sumatriptan 50 mg PRN. Consider low dose amitriptyline HS if headaches increase in frequency.

## 2021-05-13 NOTE — Assessment & Plan Note (Signed)
Infrequent use of albuterol inhaler. Continue PRN albuterol inhaler.

## 2021-05-13 NOTE — Assessment & Plan Note (Signed)
Uncontrolled.  She does feel somewhat controlled on bupropion SR 200 mg BID.   Agree that she should move forward with therapy.  Continue bupropion SR 200 mg BID.   Continue to monitor.

## 2021-05-13 NOTE — Patient Instructions (Signed)
Stop by the lab prior to leaving today. I will notify you of your results once received.  ° °It was a pleasure to see you today! ° °Preventive Care 21-36 Years Old, Female °Preventive care refers to lifestyle choices and visits with your health care provider that can promote health and wellness. Preventive care visits are also called wellness exams. °What can I expect for my preventive care visit? °Counseling °During your preventive care visit, your health care provider may ask about your: °Medical history, including: °Past medical problems. °Family medical history. °Pregnancy history. °Current health, including: °Menstrual cycle. °Method of birth control. °Emotional well-being. °Home life and relationship well-being. °Sexual activity and sexual health. °Lifestyle, including: °Alcohol, nicotine or tobacco, and drug use. °Access to firearms. °Diet, exercise, and sleep habits. °Work and work environment. °Sunscreen use. °Safety issues such as seatbelt and bike helmet use. °Physical exam °Your health care provider may check your: °Height and weight. These may be used to calculate your BMI (body mass index). BMI is a measurement that tells if you are at a healthy weight. °Waist circumference. This measures the distance around your waistline. This measurement also tells if you are at a healthy weight and may help predict your risk of certain diseases, such as type 2 diabetes and high blood pressure. °Heart rate and blood pressure. °Body temperature. °Skin for abnormal spots. °What immunizations do I need? °Vaccines are usually given at various ages, according to a schedule. Your health care provider will recommend vaccines for you based on your age, medical history, and lifestyle or other factors, such as travel or where you work. °What tests do I need? °Screening °Your health care provider may recommend screening tests for certain conditions. This may include: °Pelvic exam and Pap test. °Lipid and cholesterol  levels. °Diabetes screening. This is done by checking your blood sugar (glucose) after you have not eaten for a while (fasting). °Hepatitis B test. °Hepatitis C test. °HIV (human immunodeficiency virus) test. °STI (sexually transmitted infection) testing, if you are at risk. °BRCA-related cancer screening. This may be done if you have a family history of breast, ovarian, tubal, or peritoneal cancers. °Talk with your health care provider about your test results, treatment options, and if necessary, the need for more tests. °Follow these instructions at home: °Eating and drinking ° °Eat a healthy diet that includes fresh fruits and vegetables, whole grains, lean protein, and low-fat dairy products. °Take vitamin and mineral supplements as recommended by your health care provider. °Do not drink alcohol if: °Your health care provider tells you not to drink. °You are pregnant, may be pregnant, or are planning to become pregnant. °If you drink alcohol: °Limit how much you have to 0-1 drink a day. °Know how much alcohol is in your drink. In the [place] one drink equals one 12 oz bottle of beer (355 mL), one 5 oz glass of wine (148 mL), or one 1½ oz glass of hard liquor (44 mL). °Lifestyle °Brush your teeth every morning and night with fluoride toothpaste. Floss one time each day. °Exercise for at least 30 minutes 5 or more days each week. °Do not use any products that contain nicotine or tobacco. These products include cigarettes, chewing tobacco, and vaping devices, such as e-cigarettes. If you need help quitting, ask your health care provider. °Do not use drugs. °If you are sexually active, practice safe sex. Use a condom or other form of protection to prevent STIs. °If you do not wish to become pregnant, use a form   of birth control. If you plan to become pregnant, see your health care provider for a prepregnancy visit. °Find healthy ways to manage stress, such as: °Meditation, yoga, or listening to  music. °Journaling. °Talking to a trusted person. °Spending time with friends and family. °Minimize exposure to UV radiation to reduce your risk of skin cancer. °Safety °Always wear your seat belt while driving or riding in a vehicle. °Do not drive: °If you have been drinking alcohol. Do not ride with someone who has been drinking. °If you have been using any mind-altering substances or drugs. °While texting. °When you are tired or distracted. °Wear a helmet and other protective equipment during sports activities. °If you have firearms in your house, make sure you follow all gun safety procedures. °Seek help if you have been physically or sexually abused. °What's next? °Go to your health care provider once a year for an annual wellness visit. °Ask your health care provider how often you should have your eyes and teeth checked. °Stay up to date on all vaccines. °This information is not intended to replace advice given to you by your health care provider. Make sure you discuss any questions you have with your health care provider. °Document Revised: 09/11/2020 Document Reviewed: 09/11/2020 °Elsevier Patient Education © 2022 Elsevier Inc. ° °

## 2021-05-13 NOTE — Assessment & Plan Note (Signed)
Doing well overall on continuous 90 day OCP's. She does break 1 week for menses after 90 days.  Continue Amethia 0.15-0.03&0.01 tablets once daily. Pap smear UTD.

## 2021-05-13 NOTE — Assessment & Plan Note (Signed)
Discussed the importance of a healthy diet and regular exercise in order for weight loss, and to reduce the risk of further co-morbidity.  She is working to get back on track with her diet.

## 2021-05-13 NOTE — Assessment & Plan Note (Signed)
Immunizations UTD. Pap smear UTD.  Discussed the importance of a healthy diet and regular exercise in order for weight loss, and to reduce the risk of further co-morbidity.  Exam today stable. Labs pending.  

## 2021-05-13 NOTE — Progress Notes (Addendum)
Subjective:    Patient ID: Casey Pope, female    DOB: 12/01/1985, 36 y.o.   MRN: [id number]  HPI  Casey Pope is a very pleasant 36 y.o. female who presents today for complete physical and follow up of chronic conditions.  Overall anxiety has increased, doesn't feel that bupropion DR 200 mg BID has helped with anxiety but does feel better in terms of depression. She is scheduled to see a new therapist next week. Her new role is very stressful, thinks her new role has caused this.    Stopped taking Topamax as she felt foggy and had memory issues. Overall headaches have improved and are less frequently. She does use sumatriptan 50 mg PRN, overall infrequent.   Immunizations: -Tetanus: 2016 -Influenza: Completed this season  -Covid-19: 2 vaccines  Diet: Fair diet, has gained weight in her newer job and is stress eating. Is now working on diet.  Exercise: No regular exercise.  Eye exam: Completes annually  Dental exam: Completes semi-annually   Pap Smear: Completed in December 2021  BP Readings from Last 3 Encounters:  05/13/21 120/84  03/19/20 114/62  04/01/18 108/76         Review of Systems  Constitutional:  Negative for unexpected weight change.  HENT:  Negative for rhinorrhea.   Respiratory:  Negative for cough and shortness of breath.   Cardiovascular:  Negative for chest pain.  Gastrointestinal:  Negative for constipation and diarrhea.  Genitourinary:  Negative for difficulty urinating and menstrual problem.  Musculoskeletal:  Negative for arthralgias and myalgias.  Skin:  Negative for rash.  Allergic/Immunologic: Negative for environmental allergies.  Neurological:  Negative for dizziness and headaches.  Psychiatric/Behavioral:  The patient is nervous/anxious.         Past Medical History:  Diagnosis Date   Asthma    as a child - rarely uses inhaler   C. difficile colitis 12/01/2016   Colitis 11/29/2016   Disease of  appendix    Lower back pain    ? herniated disc   Plantar fasciitis    Rectal bleeding 03/11/2018    Social History   Socioeconomic History   Marital status: Married    Spouse name: Not on file   Number of children: Not on file   Years of education: Not on file   Highest education level: Not on file  Occupational History   Not on file  Tobacco Use   Smoking status: Never   Smokeless tobacco: Never  Substance and Sexual Activity   Alcohol use: Yes    Alcohol/week: 0.0 standard drinks    Comment: socially but none with pregnancy   Drug use: No   Sexual activity: Yes    Birth control/protection: None  Other Topics Concern   Not on file  Social History Narrative   Married.   Has 3 three children.   Completed Masters degree, works as a Pharmacist, hospital.   Enjoys spending time with her family.   Social Determinants of Health   Financial Resource Strain: Not on file  Food Insecurity: Not on file  Transportation Needs: Not on file  Physical Activity: Not on file  Stress: Not on file  Social Connections: Not on file  Intimate Partner Violence: Not on file    Past Surgical History:  Procedure Laterality Date   [place] Bilateral 02/27/2014   Procedure: CESAREAN SECTION WITH BILATERAL TUBAL LIGATION;  Surgeon: Linda Hedges, DO;  Location: [facility];  Service: Obstetrics;  Laterality: Bilateral;  Repeat  edc 03/06/14   CHOLECYSTECTOMY     WISDOM TOOTH EXTRACTION      Family History  Problem Relation Age of Onset   Hypertension Mother    Diabetes Mother    Arthritis Father    Cancer Maternal Grandfather        lung   Inflammatory bowel disease Neg Hx     Allergies  Allergen Reactions   Sulfa Antibiotics Hives   Ciprofloxacin    Clindamycin/Lincomycin Other (See Comments)    C-Diff   Amoxicillin Rash   Nickel Rash    Current Outpatient Medications on File Prior to Visit  Medication Sig Dispense Refill    albuterol (VENTOLIN HFA) 108 (90 Base) MCG/ACT inhaler Inhale 2 puffs into the lungs every 6 (six) hours as needed for shortness of breath. 1 each 0   buPROPion (WELLBUTRIN SR) 200 MG 12 hr tablet TAKE 1 TABLET (200 MG TOTAL) BY MOUTH 2 (TWO) TIMES DAILY. FOR ANXIETY AND DEPRESSION. 180 tablet 2   Levonorgestrel-Ethinyl Estradiol (AMETHIA) 0.15-0.03 &0.01 MG tablet Take 1 tablet by mouth daily. Office visit required for further refills. 91 tablet 0   SUMAtriptan (IMITREX) 50 MG tablet Take 1 tablet by mouth at migraine onset, may repeat in 2 hours if headache persists or recurs. 10 tablet 0   No current facility-administered medications on file prior to visit.    BP 120/84 (BP Location: Left Arm, Patient Position: Sitting, Cuff Size: Normal)    Pulse 88    Temp 98 F (36.7 C) (Temporal)    Ht 5\' 4"  (1.626 m)    Wt 245 lb (111.1 kg)    SpO2 98%    BMI 42.05 kg/m  Objective:   Physical Exam HENT:     Right Ear: Tympanic membrane and ear canal normal.     Left Ear: Tympanic membrane and ear canal normal.     Nose: Nose normal.  Eyes:     Conjunctiva/sclera: Conjunctivae normal.     Pupils: Pupils are equal, round, and reactive to light.  Neck:     Thyroid: No thyromegaly.  Cardiovascular:     Rate and Rhythm: Normal rate and regular rhythm.     Heart sounds: No murmur heard. Pulmonary:     Effort: Pulmonary effort is normal.     Breath sounds: Normal breath sounds. No rales.  Abdominal:     General: Bowel sounds are normal.     Palpations: Abdomen is soft.     Tenderness: There is no abdominal tenderness.  Musculoskeletal:        General: Normal range of motion.     Cervical back: Neck supple.  Lymphadenopathy:     Cervical: No cervical adenopathy.  Skin:    General: Skin is warm and dry.     Findings: No rash.  Neurological:     Mental Status: She is alert and oriented to person, place, and time.     Cranial Nerves: No cranial nerve deficit.     Deep Tendon Reflexes: Reflexes  are normal and symmetric.  Psychiatric:        Mood and Affect: Mood normal.          Assessment & Plan:      This visit occurred during the SARS-CoV-2 public health emergency.  Safety protocols were in place, including screening questions prior to the visit, additional usage of staff PPE, and extensive cleaning of exam room while observing  appropriate contact time as indicated for disinfecting solutions.

## 2021-05-14 LAB — LIPID PANEL
Cholesterol: 140 mg/dL (ref 0–200)
HDL: 67.6 mg/dL (ref >39.00)
LDL Cholesterol: 60 mg/dL (ref 0–99)
NonHDL: 72.61
Total CHOL/HDL Ratio: 2
Triglycerides: 61 mg/dL (ref 0.0–149.0)
VLDL: 12.2 mg/dL (ref 0.0–40.0)

## 2021-05-14 LAB — COMPREHENSIVE METABOLIC PANEL
ALT: 21 U/L (ref 0–35)
AST: 16 U/L (ref 0–37)
Albumin: 4.5 g/dL (ref 3.5–5.2)
Alkaline Phosphatase: 62 U/L (ref 39–117)
BUN: 9 mg/dL (ref 6–23)
CO2: 26 mEq/L (ref 19–32)
Calcium: 9.2 mg/dL (ref 8.4–10.5)
Chloride: 102 mEq/L (ref 96–112)
Creatinine, Ser: 0.75 mg/dL (ref 0.40–1.20)
GFR: 102.64 mL/min (ref >60.00)
Glucose, Bld: 62 mg/dL — ABNORMAL LOW (ref 70–99)
Potassium: 4.4 mEq/L (ref 3.5–5.1)
Sodium: 136 mEq/L (ref 135–145)
Total Bilirubin: 0.6 mg/dL (ref 0.2–1.2)
Total Protein: 7.3 g/dL (ref 6.0–8.3)

## 2021-07-25 ENCOUNTER — Other Ambulatory Visit: Payer: Self-pay | Admitting: Primary Care

## 2021-07-25 DIAGNOSIS — Z3041 Encounter for surveillance of contraceptive pills: Secondary | ICD-10-CM

## 2021-08-15 ENCOUNTER — Other Ambulatory Visit: Payer: Self-pay | Admitting: Primary Care

## 2021-08-15 DIAGNOSIS — F411 Generalized anxiety disorder: Secondary | ICD-10-CM

## 2021-09-15 ENCOUNTER — Telehealth (INDEPENDENT_AMBULATORY_CARE_PROVIDER_SITE_OTHER): Payer: BC Managed Care – PPO | Admitting: Family Medicine

## 2021-09-15 VITALS — Wt 250.0 lb

## 2021-09-15 DIAGNOSIS — Z3041 Encounter for surveillance of contraceptive pills: Secondary | ICD-10-CM | POA: Diagnosis not present

## 2021-09-15 DIAGNOSIS — R6889 Other general symptoms and signs: Secondary | ICD-10-CM | POA: Insufficient documentation

## 2021-09-15 DIAGNOSIS — G43709 Chronic migraine without aura, not intractable, without status migrainosus: Secondary | ICD-10-CM | POA: Diagnosis not present

## 2021-09-15 DIAGNOSIS — R519 Headache, unspecified: Secondary | ICD-10-CM | POA: Diagnosis not present

## 2021-09-15 MED ORDER — TOPIRAMATE 25 MG PO TABS: ORAL_TABLET | ORAL | 0 refills | Status: DC

## 2021-09-15 MED ORDER — SUMATRIPTAN SUCCINATE 100 MG PO TABS: ORAL_TABLET | ORAL | 0 refills | Status: DC

## 2021-09-15 NOTE — Progress Notes (Signed)
I connected with Casey Pope on 09/15/21 at  4:00 PM EDT by video and verified that I am speaking with the correct person using two identifiers.   I discussed the limitations, risks, security and privacy concerns of performing an evaluation and management service by video and the availability of in person appointments. I also discussed with the patient that there may be a patient responsible charge related to this service. The patient expressed understanding and agreed to proceed.  Patient location: work Provider Location: [facility] Participants: Casey Pope and Jacinto Halim Earnest Conroy   Subjective:     Casey Pope is a 36 y.o. female presenting for Migraine (X 3 weeks. 1 migraine a week / 3-5 headaches a week) and Hot Flashes (Throughout the day, but mostly in the morning. )     Migraine     #Migraines - had stopped completely - just started back 3 weeks - had stopped topimax due to no HA - stopped around 6 months - was dazed on topimax 150 mg - did better on the lower doses - did go to neurology and had MRI - high stress job - started graduate courses tomorrow  - getting hot during the day - not sure if hot flashes - hair got curly again - taking OCPs - the name of the birth control changed   Endorses blurry vision  Nausea/vomiting Just above the eye brow  Was getting cyclical migraines - worse at that time  Never tried riboflavin or vitamin E  Imitrex is effective at 100 mg  -    Review of Systems   Social History   Tobacco Use  Smoking Status Never  Smokeless Tobacco Never        Objective:   BP Readings from Last 3 Encounters:  05/13/21 120/84  03/19/20 114/62  04/01/18 108/76   Wt Readings from Last 3 Encounters:  09/15/21 250 lb (113.4 kg)  05/13/21 245 lb (111.1 kg)  03/19/20 209 lb (94.8 kg)   Wt 250 lb (113.4 kg)   BMI 42.91 kg/m   Physical Exam Constitutional:       General: She is not in acute distress.    Appearance: She is well-developed. She is not diaphoretic.  HENT:     Right Ear: External ear normal.     Left Ear: External ear normal.     Nose: Nose normal.  Eyes:     Conjunctiva/sclera: Conjunctivae normal.  Cardiovascular:     Rate and Rhythm: Normal rate.  Pulmonary:     Effort: Pulmonary effort is normal.  Musculoskeletal:     Cervical back: Neck supple.  Skin:    General: Skin is warm and dry.     Capillary Refill: Capillary refill takes less than 2 seconds.  Neurological:     Mental Status: She is alert. Mental status is at baseline.  Psychiatric:        Mood and Affect: Mood normal.        Behavior: Behavior normal.           Assessment & Plan:   Problem List Items Addressed This Visit       Cardiovascular and Mediastinum   Chronic migraine without aura without status migrainosus, not intractable - Primary    Patient with recent recurrence of migraine headaches.  They respond to sumatriptan hand but she is requiring higher doses, repeat refill of 100 mg tablets sent to pharmacy.  Long discussion regarding  options for treatment.  She previously tolerated 50 mg of topiramate twice daily, will refill with titration to reach this point.  If ineffective or not tolerating she will follow-up with PCP, discussed possible alternatives.  Also discussed riboflavin and vitamin E as a possible options for prophylactics.      Relevant Medications   topiramate (TOPAMAX) 25 MG tablet   SUMAtriptan (IMITREX) 100 MG tablet   Other Relevant Orders   Comprehensive metabolic panel   CBC     Other   Frequent headaches   Relevant Medications   topiramate (TOPAMAX) 25 MG tablet   SUMAtriptan (IMITREX) 100 MG tablet   Encounter for birth control pills maintenance    She does note that when she picked up her most recent birth control pills the brand had changed.  Discussed it is possible this was the trigger for her migraine headaches  offered sending in a new prescription, she declined at this time.  We will consider this in the future.      Heat intolerance    Discussed ruling out thyroid as a possible cause for this.  Check liver and kidney function and blood counts.      Relevant Orders   TSH     Return if symptoms worsen or fail to improve.  Casey Child, MD

## 2021-09-15 NOTE — Assessment & Plan Note (Signed)
She does note that when she picked up her most recent birth control pills the brand had changed.  Discussed it is possible this was the trigger for her migraine headaches offered sending in a new prescription, she declined at this time.  We will consider this in the future.

## 2021-09-15 NOTE — Patient Instructions (Addendum)
Headache Prevention  Riboflavin has been shown to be effective in reducing the frequency of migraine headaches. This is a supplement that is safe and well tolerated. The only side effect is a change in urine color.   Take 400 mg of Riboflavin daily.   I recommend picking up a bottle and taking the medication until you finish the bottle. If it helped, continued. If no improvement or change, then do not purchase another bottle.   Vitamin E 400 units - 5 days around menses ma also help   Start Topiramate   If ineffective - Propranolol - Amitriptyline

## 2021-09-15 NOTE — Assessment & Plan Note (Signed)
Discussed ruling out thyroid as a possible cause for this.  Check liver and kidney function and blood counts.

## 2021-09-15 NOTE — Assessment & Plan Note (Signed)
Patient with recent recurrence of migraine headaches.  They respond to sumatriptan hand but she is requiring higher doses, repeat refill of 100 mg tablets sent to pharmacy.  Long discussion regarding options for treatment.  She previously tolerated 50 mg of topiramate twice daily, will refill with titration to reach this point.  If ineffective or not tolerating she will follow-up with PCP, discussed possible alternatives.  Also discussed riboflavin and vitamin E as a possible options for prophylactics.

## 2021-10-07 ENCOUNTER — Other Ambulatory Visit: Payer: Self-pay | Admitting: Family Medicine

## 2021-10-07 DIAGNOSIS — G43709 Chronic migraine without aura, not intractable, without status migrainosus: Secondary | ICD-10-CM

## 2021-10-21 ENCOUNTER — Other Ambulatory Visit: Payer: Self-pay | Admitting: Nurse Practitioner

## 2021-10-21 DIAGNOSIS — Z3041 Encounter for surveillance of contraceptive pills: Secondary | ICD-10-CM

## 2021-11-20 ENCOUNTER — Encounter: Payer: Self-pay | Admitting: Primary Care

## 2021-11-20 ENCOUNTER — Ambulatory Visit: Payer: BC Managed Care – PPO | Admitting: Primary Care

## 2021-11-20 VITALS — BP 118/84 | HR 84 | Temp 98.3°F | Resp 16 | Ht 64.0 in | Wt 251.5 lb

## 2021-11-20 DIAGNOSIS — R81 Glycosuria: Secondary | ICD-10-CM | POA: Diagnosis not present

## 2021-11-20 DIAGNOSIS — R519 Headache, unspecified: Secondary | ICD-10-CM

## 2021-11-20 DIAGNOSIS — F411 Generalized anxiety disorder: Secondary | ICD-10-CM

## 2021-11-20 DIAGNOSIS — G43709 Chronic migraine without aura, not intractable, without status migrainosus: Secondary | ICD-10-CM | POA: Diagnosis not present

## 2021-11-20 DIAGNOSIS — Z6841 Body Mass Index (BMI) 40.0 and over, adult: Secondary | ICD-10-CM

## 2021-11-20 LAB — TSH: TSH: 1.19 u[IU]/mL (ref 0.35–5.50)

## 2021-11-20 LAB — HEMOGLOBIN A1C: Hgb A1c MFr Bld: 5.4 % (ref 4.6–6.5)

## 2021-11-20 NOTE — Assessment & Plan Note (Addendum)
Deteriorated.   We discussed correct directions for buproprion SR 200 mg BID, not 400 mg once daily.   Referral placed for therapy per patient request.  Patient will update if no improvement. Consider hydroxyzine PRN vs add SSRI/SNRI.  I evaluated patient, was consulted regarding treatment, and agree with assessment and plan per Modesto Charon, RN, DNP student.   Mayra Reel, NP-C

## 2021-11-20 NOTE — Assessment & Plan Note (Addendum)
Poor diet, no regular exercise. Discussed to work on lifestyle changes.  Checking labs today including A1C, TSH.  Continue to monitor.

## 2021-11-20 NOTE — Progress Notes (Signed)
Subjective:    Patient ID: Casey Pope, female    DOB: [birth year], 36 y.o.   MRN: [id number]  Depression        Associated symptoms include fatigue and headaches.   Casey Pope Samyra Limb is a very pleasant 36 y.o. female with a history of chronic migraines, GAD, morbid obesity, who presents today to discuss anxiety and obesity.  She is also here to follow-up on migraines.  1) Anxiety and Depression: Currently managed on bupropion SR 200 mg BID. Previously managed on Lexapro and Zoloft at different times.   Over the last few weeks she's developed increased anxiety with symptoms of sleep disturbance, mind racing thoughts, feeling overwhelmed with work and school. She is taking bupropion SR 400 mg once daily rather than 200 mg BID. At the end of her day she feels "exhausted".   She's disappointed with her weight gain. She endorses a poor diet and is not exercising. She is eating fast food and take out food regularly. Tries to pack her lunch but does not always have access to a microwave.   Wt Readings from Last 3 Encounters:  11/20/21 251 lb 8 oz (114.1 kg)  09/15/21 250 lb (113.4 kg)  05/13/21 245 lb (111.1 kg)     2) Migraines: Chronic.  Evaluated by Dr. Selena Batten in June 2023, migraines have returned and were more intense. No longer on Topamax at the time.  During this visit she was reinitiated on topiramate taper up with a goal of 50 mg twice daily.  Also managed on sumatriptan 100 mg daily as needed.  Since re initiation of topiramate headaches/migraines have improved, but she continues to experience headaches several times weekly. Migraines are occurring weekly. She is taking only 50 mg of Topamax once daily as she didn't realize that she was to taper up. The increased dose of sumatriptan at 100 mg are more effective.  Review of Systems  Constitutional:  Positive for fatigue.  Respiratory:  Negative for shortness of breath.   Cardiovascular:  Negative for chest  pain.  Neurological:  Positive for headaches.  Psychiatric/Behavioral:  Positive for depression and sleep disturbance. The patient is nervous/anxious.          Past Medical History:  Diagnosis Date   Asthma    as a child - rarely uses inhaler   C. difficile colitis 12/01/2016   Colitis 11/29/2016   Disease of appendix    Lower back pain    ? herniated disc   Plantar fasciitis    Rectal bleeding 03/11/2018    Social History   Socioeconomic History   Marital status: Married    Spouse name: Not on file   Number of children: Not on file   Years of education: Not on file   Highest education level: Not on file  Occupational History   Not on file  Tobacco Use   Smoking status: Never   Smokeless tobacco: Never  Substance and Sexual Activity   Alcohol use: Yes    Alcohol/week: 0.0 standard drinks of alcohol    Comment: socially but none with pregnancy   Drug use: No   Sexual activity: Yes    Birth control/protection: None  Other Topics Concern   Not on file  Social History Narrative   Married.   Has 3 three children.   Completed Masters degree, works as a Runner, broadcasting/film/video.   Enjoys spending time with her family.   Social Determinants of Health   Financial Resource Strain: Not  on file  Food Insecurity: Not on file  Transportation Needs: Not on file  Physical Activity: Not on file  Stress: Not on file  Social Connections: Not on file  Intimate Partner Violence: Not on file    Past Surgical History:  Procedure Laterality Date   CESAREAN SECTION     CESAREAN SECTION WITH BILATERAL TUBAL LIGATION Bilateral 02/27/2014   Procedure: CESAREAN SECTION WITH BILATERAL TUBAL LIGATION;  Surgeon: Mitchel Honour, DO;  Location: [facility];  Service: Obstetrics;  Laterality: Bilateral;  Repeat  edc 03/06/14   CHOLECYSTECTOMY     WISDOM TOOTH EXTRACTION      Family History  Problem Relation Age of Onset   Hypertension Mother    Diabetes Mother    Arthritis Father    Cancer Maternal  Grandfather        lung   Inflammatory bowel disease Neg Hx     Allergies  Allergen Reactions   Sulfa Antibiotics Hives   Ciprofloxacin    Clindamycin/Lincomycin Other (See Comments)    C-Diff   Amoxicillin Rash   Nickel Rash    Current Outpatient Medications on File Prior to Visit  Medication Sig Dispense Refill   topiramate (TOPAMAX) 50 MG tablet Take 50 mg by mouth 2 (two) times daily.     albuterol (VENTOLIN HFA) 108 (90 Base) MCG/ACT inhaler Inhale 2 puffs into the lungs every 6 (six) hours as needed for shortness of breath. 1 each 0   buPROPion (WELLBUTRIN SR) 200 MG 12 hr tablet TAKE 1 TABLET (200 MG TOTAL) BY MOUTH 2 (TWO) TIMES DAILY. FOR ANXIETY AND DEPRESSION. 180 tablet 2   Levonorgestrel-Ethinyl Estradiol (SIMPESSE) 0.15-0.03 &0.01 MG tablet Take 1 tablet by mouth daily. 91 tablet 1   SUMAtriptan (IMITREX) 100 MG tablet Take 1 tablet by mouth at migraine onset, may repeat in 2 hours if headache persists or recurs. 10 tablet 0   No current facility-administered medications on file prior to visit.    BP 118/84   Pulse 84   Temp 98.3 F (36.8 C)   Resp 16   Ht 5\' 4"  (1.626 m)   Wt 251 lb 8 oz (114.1 kg)   SpO2 99%   BMI 43.17 kg/m  Objective:   Physical Exam Cardiovascular:     Rate and Rhythm: Normal rate and regular rhythm.  Pulmonary:     Effort: Pulmonary effort is normal.     Breath sounds: Normal breath sounds.  Musculoskeletal:     Cervical back: Neck supple.  Skin:    General: Skin is warm and dry.  Neurological:     Mental Status: She is alert.  Psychiatric:        Mood and Affect: Mood normal.           Assessment & Plan:   Problem List Items Addressed This Visit       Cardiovascular and Mediastinum   Chronic migraine without aura without status migrainosus, not intractable - Primary    Improving, but not at goal.   Increase Topamax 50 mg twice daily.  Continue sumatriptan 100 mg PRN.  She will update.  I evaluated patient,  was consulted regarding treatment, and agree with assessment and plan per , RN, DNP student.   Modesto Charon, NP-C       Relevant Medications   topiramate (TOPAMAX) 50 MG tablet   Other Relevant Orders   TSH     Other   Generalized anxiety disorder    Deteriorated.  We discussed correct directions for buproprion SR 200 mg BID, not 400 mg once daily.   Referral placed for therapy per patient request.  Patient will update if no improvement. Consider hydroxyzine PRN vs add SSRI/SNRI.  I evaluated patient, was consulted regarding treatment, and agree with assessment and plan per Modesto Charon, RN, DNP student.   Mayra Reel, NP-C       Relevant Orders   Ambulatory referral to Psychology   Frequent headaches    Uncontrolled.   Increase Topamax to 50 mg twice a daily.   Continue sumatriptan 100 mg PRN.   She will update.   I evaluated patient, was consulted regarding treatment, and agree with assessment and plan per Modesto Charon, RN, DNP student.   Mayra Reel, NP-C       Relevant Medications   topiramate (TOPAMAX) 50 MG tablet   Morbid obesity with BMI of 40.0-44.9, adult (HCC)    Poor diet, no regular exercise. Discussed to work on lifestyle changes.  Checking labs today including A1C, TSH.  Continue to monitor.       Glucosuria    Reviewed recent office notes and UA from Urgent Care visit through [facility], [place]. Patient declines repeat UA as she is menstruating.   Check A1C.   I evaluated patient, was consulted regarding treatment, and agree with assessment and plan per Modesto Charon, RN, DNP student.   Mayra Reel, NP-C       Relevant Orders   Hemoglobin A1c       Doreene Nest, NP

## 2021-11-20 NOTE — Assessment & Plan Note (Addendum)
Reviewed recent office notes and UA from Urgent Care visit through [facility], [place]. Patient declines repeat UA as she is menstruating.   Check A1C.   I evaluated patient, was consulted regarding treatment, and agree with assessment and plan per Modesto Charon, RN, DNP student.   Mayra Reel, NP-C

## 2021-11-20 NOTE — Assessment & Plan Note (Addendum)
Improving, but not at goal.   Increase Topamax 50 mg twice daily.  Continue sumatriptan 100 mg PRN.  She will update.  I evaluated patient, was consulted regarding treatment, and agree with assessment and plan per Modesto Charon, RN, DNP student.   Mayra Reel, NP-C

## 2021-11-20 NOTE — Patient Instructions (Addendum)
Stop by the lab prior to leaving today. I will notify you of your results once received.   Referral has been sent for therapist. Bonita Quin will get a phone call within two weeks. If you don't hear back, please let me know.   Please let me know when you finish the 25 mg tablets of Topamax. We can send the 50 mg tablets so you will only need to take one tablet twice a day.  It was a pleasure to see you today!

## 2021-11-20 NOTE — Assessment & Plan Note (Addendum)
Uncontrolled.   Increase Topamax to 50 mg twice a daily.   Continue sumatriptan 100 mg PRN.   She will update.   I evaluated patient, was consulted regarding treatment, and agree with assessment and plan per Modesto Charon, RN, DNP student.   Mayra Reel, NP-C

## 2021-11-20 NOTE — Progress Notes (Signed)
Established Patient Office Visit  Subjective   Patient ID: Casey Pope, female    DOB: [birth year]  Age: 36 y.o. MRN: [id number]  Chief Complaint  Patient presents with   Depression   Obesity    Concern about weigh going up 60 lbs in a year and half    HPI Casey Pope is a 36 year old female with a past medical history of generalized anxiety disorder, asthma and chronic migraines presents to the office today to discuss depression and weight gain.   Headaches: She is taking Topamax 50 mg daily. She is still having headaches. She reports one headache per week. She has been using heated eye mask that has been effective.   Anxiety: work is more stressful lately. She is not sleeping well. Goes to bed in later than 10 pm but tosses and turns all night. Takes a long time to fall asleep and her mind is racing throughout the night. She gets around 2-3 hours of sleep. She is trying to see if she can change jobs.  Depression: She is currently taking Bupropion SR 200 mg twice a day for depression. She takes two tabs in the morning. She feels that its still effective. Denies SI/HI. Her therapist moved and she has not found a new one. Her job is a sitting job.   Weight gain: She reports that she has gained 60 lbs in the last year. She eats 2-3 meals per day. She usually skips breakfast. Has lunch on the go such as fast food or [place]. She tries to include a vegetable in her meal. She had a UTI last month and is concerned about the glucose in her urine. Her mother has a history of diabetes and she was diagnosed in her 31's. She is interested in weight loss medication. She has stopped drinking soda. She tries to go for a walk when she can.      Review of Systems  Eyes:  Negative for blurred vision.  Respiratory:  Negative for shortness of breath and wheezing.   Cardiovascular:  Negative for chest pain.  Genitourinary:  Negative for dysuria.  Neurological:  Positive for  headaches. Negative for tingling and weakness.  Psychiatric/Behavioral:  Positive for depression. Negative for suicidal ideas.   All other systems reviewed and are negative.     Objective:     BP 118/84   Pulse 84   Temp 98.3 F (36.8 C)   Resp 16   Ht 5\' 4"  (1.626 m)   Wt 251 lb 8 oz (114.1 kg)   SpO2 99%   BMI 43.17 kg/m  BP Readings from Last 3 Encounters:  11/20/21 118/84  05/13/21 120/84  03/19/20 114/62   Wt Readings from Last 3 Encounters:  11/20/21 251 lb 8 oz (114.1 kg)  09/15/21 250 lb (113.4 kg)  05/13/21 245 lb (111.1 kg)      Physical Exam Vitals and nursing note reviewed.  Constitutional:      Appearance: Normal appearance. She is obese.  Cardiovascular:     Rate and Rhythm: Normal rate and regular rhythm.     Pulses: Normal pulses.     Heart sounds: Normal heart sounds.  Pulmonary:     Effort: Pulmonary effort is normal.     Breath sounds: Normal breath sounds.  Neurological:     Mental Status: She is alert and oriented to person, place, and time.  Psychiatric:     Comments: Tearful  No results found for any visits on 11/20/21.     Assessment & Plan:   Problem List Items Addressed This Visit       Cardiovascular and Mediastinum   Chronic migraine without aura without status migrainosus, not intractable - Primary    Improving, but not at goal.   Increase Topamax 50 mg twice daily.  Continue sumatriptan 100 mg PRN.  She will update.  I evaluated patient, was consulted regarding treatment, and agree with assessment and plan per Modesto Charon, RN, DNP student.   Mayra Reel, NP-C       Relevant Medications   topiramate (TOPAMAX) 50 MG tablet   Other Relevant Orders   TSH     Other   Generalized anxiety disorder    Deteriorated.   We discussed correct directions for buproprion SR 200 mg BID, not 400 mg once daily.   Referral placed for therapy per patient request.  Patient will update if no improvement. Consider  hydroxyzine PRN vs add SSRI/SNRI.  I evaluated patient, was consulted regarding treatment, and agree with assessment and plan per Modesto Charon, RN, DNP student.   Mayra Reel, NP-C       Relevant Orders   Ambulatory referral to Psychology   Frequent headaches    Uncontrolled.   Increase Topamax to 50 mg twice a daily.   Continue sumatriptan 100 mg PRN.   She will update.   I evaluated patient, was consulted regarding treatment, and agree with assessment and plan per Modesto Charon, RN, DNP student.   Mayra Reel, NP-C       Relevant Medications   topiramate (TOPAMAX) 50 MG tablet   Morbid obesity with BMI of 40.0-44.9, adult (HCC)    Poor diet, no regular exercise. Discussed to work on lifestyle changes.  Checking labs today including A1C, TSH.  Continue to monitor.       Glucosuria    Reviewed recent office notes and labs including urinalysis.    Patient is on her period, deferred urinalysis.   Hemoglobin A1C pending.      Relevant Orders   Hemoglobin A1c    No follow-ups on file.    Modesto Charon, BSN-RN, DNP STUDENT

## 2021-12-19 ENCOUNTER — Ambulatory Visit: Payer: BC Managed Care – PPO | Admitting: Clinical

## 2022-03-25 ENCOUNTER — Telehealth: Payer: Self-pay

## 2022-03-25 DIAGNOSIS — R519 Headache, unspecified: Secondary | ICD-10-CM

## 2022-03-25 DIAGNOSIS — G43709 Chronic migraine without aura, not intractable, without status migrainosus: Secondary | ICD-10-CM

## 2022-03-25 MED ORDER — TOPIRAMATE 50 MG PO TABS: 50.0000 mg | ORAL_TABLET | Freq: Two times a day (BID) | ORAL | 1 refills | Status: DC

## 2022-03-25 NOTE — Telephone Encounter (Signed)
Noted, refill provided. Glad she is doing well!

## 2022-03-25 NOTE — Addendum Note (Signed)
Addended by: Doreene Nest on: 03/25/2022 11:47 AM   Modules accepted: Orders

## 2022-03-25 NOTE — Telephone Encounter (Signed)
Received refill request for    Per chart review from office visit 11/20/2021 topirmate was increased to 50mg  twice daily.  Called patient to verify how much and how often she was taking. She states she is taking the Topirmate 50mg  tablets twice daily, and since increasing the medication she hasn't had a migraine. She states she is doing very well on this medication.  She accidentally requested the refill of the 25mg  dose. She would like a prescription for Topirmate 50mg  tab sent in.

## 2022-04-23 ENCOUNTER — Ambulatory Visit: Payer: BC Managed Care – PPO | Admitting: Family Medicine

## 2022-04-29 ENCOUNTER — Other Ambulatory Visit: Payer: Self-pay | Admitting: Primary Care

## 2022-04-29 DIAGNOSIS — Z3041 Encounter for surveillance of contraceptive pills: Secondary | ICD-10-CM

## 2022-04-29 NOTE — Telephone Encounter (Signed)
Patient is due for CPE/follow up in March. Please schedule, thank you!   

## 2022-04-29 NOTE — Telephone Encounter (Signed)
Patient will call back and schedule,at work

## 2022-05-08 ENCOUNTER — Encounter: Payer: Self-pay | Admitting: Family Medicine

## 2022-05-08 ENCOUNTER — Ambulatory Visit: Payer: BC Managed Care – PPO | Admitting: Family Medicine

## 2022-05-08 VITALS — BP 100/70 | HR 94 | Temp 97.7°F | Ht 64.0 in | Wt 239.1 lb

## 2022-05-08 DIAGNOSIS — H029 Unspecified disorder of eyelid: Secondary | ICD-10-CM | POA: Insufficient documentation

## 2022-05-08 DIAGNOSIS — Z23 Encounter for immunization: Secondary | ICD-10-CM

## 2022-05-08 MED ORDER — ERYTHROMYCIN 5 MG/GM OP OINT: 1.0000 | TOPICAL_OINTMENT | Freq: Every day | OPHTHALMIC | 0 refills | Status: DC

## 2022-05-08 NOTE — Patient Instructions (Signed)
Apply topical antibiotic ointment on left upper lid.  Increase warm compresses to three times daily.  Call for dermatology referral if improving but not resolving.  Follow up  sooner if worsening.

## 2022-05-08 NOTE — Progress Notes (Signed)
Patient ID: Leasia Willhoit Dorina Hoyer, female    DOB: February 12, 1986, 37 y.o.   MRN: [id number]  This visit was conducted in person.  BP 100/70   Pulse 94   Temp 97.7 F (36.5 C) (Temporal)   Ht 5' 4"$  (1.626 m)   Wt 239 lb 2 oz (108.5 kg)   SpO2 98%   BMI 41.05 kg/m    CC:  Chief Complaint  Patient presents with   Bump on Left Eyelid    Subjective:   HPI: Shacola Cypher is a 37 y.o. female presenting on 05/08/2022 for Bump on Left Eyelid   She has noted over the last several months  noted skin color lump on left upper lid, now in last several day.. sore , increase in size.  No discharge, area feels hard. No associated eye redness or skin redness.   Feels well otherwise, no flu like symptoms, cold symptoms.  NO vision change. Usually wears contacts but cannot tolerate them.   She is applying warm compresses at night, tylenol for pain.       Relevant past medical, surgical, family and social history reviewed and updated as indicated. Interim medical history since our last visit reviewed. Allergies and medications reviewed and updated. Outpatient Medications Prior to Visit  Medication Sig Dispense Refill   albuterol (VENTOLIN HFA) 108 (90 Base) MCG/ACT inhaler Inhale 2 puffs into the lungs every 6 (six) hours as needed for shortness of breath. 1 each 0   buPROPion (WELLBUTRIN SR) 200 MG 12 hr tablet TAKE 1 TABLET (200 MG TOTAL) BY MOUTH 2 (TWO) TIMES DAILY. FOR ANXIETY AND DEPRESSION. 180 tablet 2   Levonorgestrel-Ethinyl Estradiol (SIMPESSE) 0.15-0.03 &0.01 MG tablet TAKE 1 TABLET BY MOUTH EVERY DAY 91 tablet 0   SUMAtriptan (IMITREX) 100 MG tablet Take 1 tablet by mouth at migraine onset, may repeat in 2 hours if headache persists or recurs. 10 tablet 0   topiramate (TOPAMAX) 50 MG tablet Take 1 tablet (50 mg total) by mouth 2 (two) times daily. 180 tablet 1   No facility-administered medications prior to visit.     Per HPI unless specifically indicated in  ROS section below Review of Systems  Constitutional:  Negative for fatigue and fever.  HENT:  Negative for ear pain.   Eyes:  Negative for pain.  Respiratory:  Negative for chest tightness and shortness of breath.   Cardiovascular:  Negative for chest pain, palpitations and leg swelling.  Gastrointestinal:  Negative for abdominal pain.  Genitourinary:  Negative for dysuria.   Objective:  BP 100/70   Pulse 94   Temp 97.7 F (36.5 C) (Temporal)   Ht 5' 4"$  (1.626 m)   Wt 239 lb 2 oz (108.5 kg)   SpO2 98%   BMI 41.05 kg/m   Wt Readings from Last 3 Encounters:  05/08/22 239 lb 2 oz (108.5 kg)  11/20/21 251 lb 8 oz (114.1 kg)  09/15/21 250 lb (113.4 kg)      Physical Exam Constitutional:      General: She is not in acute distress.    Appearance: Normal appearance. She is well-developed. She is not ill-appearing or toxic-appearing.  HENT:     Head: Normocephalic.     Right Ear: Hearing, tympanic membrane, ear canal and external ear normal. Tympanic membrane is not erythematous, retracted or bulging.     Left Ear: Hearing, tympanic membrane, ear canal and external ear normal. Tympanic membrane is not erythematous, retracted or bulging.  Nose: No mucosal edema or rhinorrhea.     Right Sinus: No maxillary sinus tenderness or frontal sinus tenderness.     Left Sinus: No maxillary sinus tenderness or frontal sinus tenderness.     Mouth/Throat:     Pharynx: Uvula midline.  Eyes:     General: Lids are normal. Lids are everted, no foreign bodies appreciated.     Conjunctiva/sclera: Conjunctivae normal.     Pupils: Pupils are equal, round, and reactive to light.  Neck:     Thyroid: No thyroid mass or thyromegaly.     Vascular: No carotid bruit.     Trachea: Trachea normal.  Cardiovascular:     Rate and Rhythm: Normal rate and regular rhythm.     Pulses: Normal pulses.     Heart sounds: Normal heart sounds, S1 normal and S2 normal. No murmur heard.    No friction rub. No gallop.   Pulmonary:     Effort: Pulmonary effort is normal. No tachypnea or respiratory distress.     Breath sounds: Normal breath sounds. No decreased breath sounds, wheezing, rhonchi or rales.  Abdominal:     General: Bowel sounds are normal.     Palpations: Abdomen is soft.     Tenderness: There is no abdominal tenderness.  Musculoskeletal:     Cervical back: Normal range of motion and neck supple.  Skin:    General: Skin is warm and dry.     Findings: No rash.  Neurological:     Mental Status: She is alert.  Psychiatric:        Mood and Affect: Mood is not anxious or depressed.        Speech: Speech normal.        Behavior: Behavior normal. Behavior is cooperative.        Thought Content: Thought content normal.        Judgment: Judgment normal.       Results for orders placed or performed in visit on 11/20/21  TSH  Result Value Ref Range   TSH 1.19 0.35 - 5.50 uIU/mL  Hemoglobin A1c  Result Value Ref Range   Hgb A1c MFr Bld 5.4 4.6 - 6.5 %    Assessment and Plan  Lesion of left eyelid Assessment & Plan:  Acute   Sebaceous cyst vs hordeolum... treat with topical antibiotics as likely more tender given possible acute infeciton. Apply warm compresses. If not resolving consdier referral to derm for treatment as not at eyelid edge.   Other orders -     Erythromycin; Place 1 Application into the left eye at bedtime.  Dispense: 3.5 g; Refill: 0    No follow-ups on file.   Eliezer Lofts, MD

## 2022-05-08 NOTE — Assessment & Plan Note (Signed)
Acute   Sebaceous cyst vs hordeolum... treat with topical antibiotics as likely more tender given possible acute infeciton. Apply warm compresses. If not resolving consdier referral to derm for treatment as not at eyelid edge.

## 2022-06-09 ENCOUNTER — Ambulatory Visit: Payer: Self-pay | Admitting: Family Medicine

## 2022-07-29 ENCOUNTER — Other Ambulatory Visit: Payer: Self-pay | Admitting: Primary Care

## 2022-07-29 DIAGNOSIS — Z3041 Encounter for surveillance of contraceptive pills: Secondary | ICD-10-CM

## 2022-08-10 ENCOUNTER — Encounter: Payer: Self-pay | Admitting: Primary Care

## 2022-09-01 ENCOUNTER — Ambulatory Visit: Payer: BC Managed Care – PPO | Admitting: Nurse Practitioner

## 2022-09-11 ENCOUNTER — Ambulatory Visit: Payer: BC Managed Care – PPO | Admitting: Nurse Practitioner

## 2022-09-18 ENCOUNTER — Encounter: Payer: Self-pay | Admitting: Family Medicine

## 2022-09-18 ENCOUNTER — Ambulatory Visit: Payer: BC Managed Care – PPO | Admitting: Family Medicine

## 2022-09-18 DIAGNOSIS — F5104 Psychophysiologic insomnia: Secondary | ICD-10-CM | POA: Diagnosis not present

## 2022-09-18 DIAGNOSIS — F39 Unspecified mood [affective] disorder: Secondary | ICD-10-CM

## 2022-09-18 DIAGNOSIS — F410 Panic disorder [episodic paroxysmal anxiety] without agoraphobia: Secondary | ICD-10-CM | POA: Diagnosis not present

## 2022-09-18 DIAGNOSIS — Z7689 Persons encountering health services in other specified circumstances: Secondary | ICD-10-CM

## 2022-09-18 DIAGNOSIS — F430 Acute stress reaction: Secondary | ICD-10-CM

## 2022-09-18 MED ORDER — NALTREXONE HCL 50 MG PO TABS: 25.0000 mg | ORAL_TABLET | Freq: Every day | ORAL | 2 refills | Status: DC

## 2022-09-18 MED ORDER — FLUOXETINE HCL 10 MG PO CAPS: 10.0000 mg | ORAL_CAPSULE | Freq: Every day | ORAL | 2 refills | Status: DC

## 2022-09-18 MED ORDER — DIAZEPAM 2 MG PO TABS: 2.0000 mg | ORAL_TABLET | Freq: Four times a day (QID) | ORAL | 0 refills | Status: DC | PRN

## 2022-09-18 MED ORDER — TRAZODONE HCL 50 MG PO TABS: 25.0000 mg | ORAL_TABLET | Freq: Every evening | ORAL | 2 refills | Status: DC | PRN

## 2022-09-18 NOTE — Progress Notes (Unsigned)
I,Sha'taria Finis Hendricksen,acting as a Neurosurgeon for Jacky Kindle, [name].,have documented all relevant documentation on the behalf of Jacky Kindle, [name],as directed by  Jacky Kindle, [name] while in the presence of Jacky Kindle, [name].   New patient visit  Patient: Casey Pope   DOB: [birth year]   37 y.o. Female  MRN: [id number] Visit Date: 09/18/2022  Today's healthcare provider: Jacky Kindle, [name]  Patient presents for new patient visit to establish care.  Introduced to Publishing rights manager role and practice setting.  All questions answered.  Discussed provider/patient relationship and expectations.  Subjective    Casey Pope is a 37 y.o. female who presents today as a new patient to establish care.   HPI  Patient reports trouble sleeping at night and struggling with weight  Past Medical History:  Diagnosis Date   Allergy    Anxiety    Asthma    as a child - rarely uses inhaler   C. difficile colitis 12/01/2016   Colitis 11/29/2016   Depression    Disease of appendix    Lower back pain    ? herniated disc   Plantar fasciitis    Rectal bleeding 03/11/2018   Past Surgical History:  Procedure Laterality Date   CESAREAN SECTION     CESAREAN SECTION WITH BILATERAL TUBAL LIGATION Bilateral 02/27/2014   Procedure: CESAREAN SECTION WITH BILATERAL TUBAL LIGATION;  Surgeon: Mitchel Honour, DO;  Location: [facility];  Service: Obstetrics;  Laterality: Bilateral;  Repeat  edc 03/06/14   CHOLECYSTECTOMY     WISDOM TOOTH EXTRACTION     Family Status  Relation Name Status   Mother Lupita Leash Alive   Father Harlon Flor   Sister  (Not Specified)   Daughter  (Not Specified)   MGM  Alive   MGF Dorinda Hill Deceased   PGM Josephine Alive   PGF  Deceased   Neg Hx  (Not Specified)   Family History  Problem Relation Age of Onset   Hypertension Mother    Diabetes Mother    Depression Mother    Arthritis Father    ADD / ADHD Sister    ADD / ADHD Daughter    Anxiety disorder Daughter     Lung cancer Maternal Grandfather 35 - 79       smoker   Macular degeneration Paternal Grandmother    Heart attack Paternal Grandfather 37 - 69   Inflammatory bowel disease Neg Hx    Social History   Socioeconomic History   Marital status: Married    Spouse name: Not on file   Number of children: Not on file   Years of education: Not on file   Highest education level: Master's degree (e.g., MA, MS, MEng, MEd, MSW, MBA)  Occupational History   Not on file  Tobacco Use   Smoking status: Never   Smokeless tobacco: Never  Vaping Use   Vaping Use: Not on file  Substance and Sexual Activity   Alcohol use: Yes    Alcohol/week: 2.0 standard drinks of alcohol    Types: 2 Glasses of wine per week    Comment: socially but none with pregnancy   Drug use: No   Sexual activity: Yes    Birth control/protection: Pill, Surgical, None    Comment: PILL USED FOR MIGRAINES  Other Topics Concern   Not on file  Social History Narrative   Married.   Has 3 three children.   Completed Masters degree, works as a Runner, broadcasting/film/video.  Enjoys spending time with her family.   Social Determinants of Health   Financial Resource Strain: Low Risk  (09/17/2022)   Overall Financial Resource Strain (CARDIA)    Difficulty of Paying Living Expenses: Not very hard  Food Insecurity: No Food Insecurity (09/17/2022)   Hunger Vital Sign    Worried About Running Out of Food in the Last Year: Never true    Ran Out of Food in the Last Year: Never true  Transportation Needs: No Transportation Needs (09/17/2022)   PRAPARE - Administrator, Civil Service (Medical): No    Lack of Transportation (Non-Medical): No  Physical Activity: Insufficiently Active (09/17/2022)   Exercise Vital Sign    Days of Exercise per Week: 3 days    Minutes of Exercise per Session: 30 min  Stress: Stress Concern Present (09/17/2022)   [employer] of Occupational Health - Occupational Stress Questionnaire    Feeling of Stress :  Very much  Social Connections: Moderately Integrated (09/17/2022)   Social Connection and Isolation Panel [NHANES]    Frequency of Communication with Friends and Family: Twice a week    Frequency of Social Gatherings with Friends and Family: Once a week    Attends Religious Services: More than 4 times per year    Active Member of [employer] or Organizations: No    Attends Engineer, structural: Not on file    Marital Status: Married   Outpatient Medications Prior to Visit  Medication Sig   albuterol (VENTOLIN HFA) 108 (90 Base) MCG/ACT inhaler Inhale 2 puffs into the lungs every 6 (six) hours as needed for shortness of breath.   buPROPion (WELLBUTRIN SR) 200 MG 12 hr tablet TAKE 1 TABLET (200 MG TOTAL) BY MOUTH 2 (TWO) TIMES DAILY. FOR ANXIETY AND DEPRESSION.   SIMPESSE 0.15-0.03 &0.01 MG tablet TAKE 1 TABLET BY MOUTH EVERY DAY   SUMAtriptan (IMITREX) 100 MG tablet Take 1 tablet by mouth at migraine onset, may repeat in 2 hours if headache persists or recurs.   topiramate (TOPAMAX) 50 MG tablet Take 1 tablet (50 mg total) by mouth 2 (two) times daily.   [DISCONTINUED] erythromycin ophthalmic ointment Place 1 Application into the left eye at bedtime. (Patient not taking: Reported on 09/18/2022)   No facility-administered medications prior to visit.   Allergies  Allergen Reactions   Sulfa Antibiotics Hives   Ciprofloxacin    Clindamycin/Lincomycin Other (See Comments)    C-Diff   Amoxicillin Rash   Nickel Rash    Immunization History  Administered Date(s) Administered   DTaP 12/05/2013   Influenza,inj,Quad PF,6+ Mos 01/21/2017, 04/01/2018, 05/08/2022   Influenza-Unspecified 01/10/2020, 01/28/2021   PFIZER(Purple Top)SARS-COV-2 Vaccination 05/28/2019, 06/19/2019   Tdap 05/30/2014    Health Maintenance  Topic Date Due   COVID-19 Vaccine (3 - 2023-24 season) 11/28/2021   INFLUENZA VACCINE  10/29/2022   PAP SMEAR-Modifier  03/20/2023   DTaP/Tdap/Td (3 - Td or Tdap) 05/29/2024    Hepatitis C Screening  Completed   HIV Screening  Completed   HPV VACCINES  Aged Out    Patient Care Team: Jacky Kindle, [name] as PCP - General (Family Medicine)  Review of Systems  Psychiatric/Behavioral:  Positive for sleep disturbance.     Last CBC Lab Results  Component Value Date   WBC 6.1 03/19/2020   HGB 14.0 03/19/2020   HCT 42.4 03/19/2020   MCV 93.4 03/19/2020   MCH 28.9 12/02/2016   RDW 13.3 03/19/2020   PLT 226.0 03/19/2020  Last metabolic panel Lab Results  Component Value Date   GLUCOSE 62 (L) 05/13/2021   NA 136 05/13/2021   K 4.4 05/13/2021   CL 102 05/13/2021   CO2 26 05/13/2021   BUN 9 05/13/2021   CREATININE 0.75 05/13/2021   GFRNONAA >60 12/02/2016   CALCIUM 9.2 05/13/2021   PROT 7.3 05/13/2021   ALBUMIN 4.5 05/13/2021   BILITOT 0.6 05/13/2021   ALKPHOS 62 05/13/2021   AST 16 05/13/2021   ALT 21 05/13/2021   ANIONGAP 3 (L) 12/02/2016   Last lipids Lab Results  Component Value Date   CHOL 140 05/13/2021   HDL 67.60 05/13/2021   LDLCALC 60 05/13/2021   TRIG 61.0 05/13/2021   CHOLHDL 2 05/13/2021   Last hemoglobin A1c Lab Results  Component Value Date   HGBA1C 5.4 11/20/2021   Last thyroid functions Lab Results  Component Value Date   TSH 1.19 11/20/2021    Objective    BP 120/69 (BP Location: Right Arm, Patient Position: Sitting, Cuff Size: Large)   Ht 5\' 4"  (1.626 m)   Wt 237 lb 14.4 oz (107.9 kg)   SpO2 100%   BMI 40.84 kg/m   BP Readings from Last 3 Encounters:  09/18/22 120/69  05/08/22 100/70  11/20/21 118/84   Wt Readings from Last 3 Encounters:  09/18/22 237 lb 14.4 oz (107.9 kg)  05/08/22 239 lb 2 oz (108.5 kg)  11/20/21 251 lb 8 oz (114.1 kg)   SpO2 Readings from Last 3 Encounters:  09/18/22 100%  05/08/22 98%  11/20/21 99%   Physical Exam Vitals and nursing note reviewed.  Constitutional:      General: She is not in acute distress.    Appearance: Normal appearance. She is obese. She is not  ill-appearing, toxic-appearing or diaphoretic.  HENT:     Head: Normocephalic and atraumatic.  Cardiovascular:     Rate and Rhythm: Normal rate and regular rhythm.     Pulses: Normal pulses.     Heart sounds: Normal heart sounds. No murmur heard.    No friction rub. No gallop.  Pulmonary:     Effort: Pulmonary effort is normal. No respiratory distress.     Breath sounds: Normal breath sounds. No stridor. No wheezing, rhonchi or rales.  Chest:     Chest wall: No tenderness.  Musculoskeletal:        General: No swelling, tenderness, deformity or signs of injury. Normal range of motion.     Right lower leg: No edema.     Left lower leg: No edema.  Skin:    General: Skin is warm and dry.     Capillary Refill: Capillary refill takes less than 2 seconds.     Coloration: Skin is not jaundiced or pale.     Findings: No bruising, erythema, lesion or rash.  Neurological:     General: No focal deficit present.     Mental Status: She is alert and oriented to person, place, and time. Mental status is at baseline.     Cranial Nerves: No cranial nerve deficit.     Sensory: No sensory deficit.     Motor: No weakness.     Coordination: Coordination normal.  Psychiatric:        Mood and Affect: Mood normal.        Behavior: Behavior normal.        Thought Content: Thought content normal.        Judgment: Judgment normal.     Depression  Screen    09/18/2022   10:31 AM 05/13/2021    8:50 AM 03/19/2020    9:00 AM 12/11/2019    1:43 PM  PHQ 2/9 Scores  PHQ - 2 Score 4 0 0 0  PHQ- 9 Score 14  0 0   No results found for any visits on 09/18/22.  Assessment & Plan      Problem List Items Addressed This Visit       Other   Encounter to establish care    Transfer practice d/t location; now living in [facility] Familiar with Nps and APPs       Mood disorder (HCC)    Acute on chronic, worsening Pt denies Depression; however, endorses some cyclic thinking patters and anxiety with  panic Start prozac daily to assist Continue to focus on the positive things and glimmers life provides Reassurance provided Pt denies concern for SI or HI at this time; continue to monitor      Relevant Medications   FLUoxetine (PROZAC) 10 MG capsule   traZODone (DESYREL) 50 MG tablet   diazepam (VALIUM) 2 MG tablet   Morbid obesity (HCC) - Primary    Chronic, Body mass index is 40.84 kg/m. Upsetting to patient as she doesn't like the way that she looks and feels She doesn't like how she looks in clothes and she is fearful that her role transition at work will worsen her weight d/t lack of ability. She now works at [employer] but does not have the support of her family and husband to assist her with working out She is also under a great deal of stress Defer use of phentermine at this time in setting of panic attacks and  insomnia Pt reports previous use of naltrexone- agreeable to trial that medication at this time to assist with lifestyle to reduce weight to healthier BMI and assist with self esteem and energy levels.      Relevant Medications   naltrexone (DEPADE) 50 MG tablet   Panic attack due to exceptional stress    Acute on chronic, increasing stress in the past few years Recommend start of prozac to assist with overall health/stress s/s mood disturbance Recommend use of PRN valium to assist with panic described as inability to take a deep breath and relax       Relevant Medications   FLUoxetine (PROZAC) 10 MG capsule   traZODone (DESYREL) 50 MG tablet   diazepam (VALIUM) 2 MG tablet   Psychophysiological insomnia    Acute on chronic, reports difficulty in turning off her brain to allow for sleep Has a lot on her plate at this time -recently moved -mom of 2 kids; step mom of 1 additional -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    Leilani Merl, [name], have reviewed all documentation for this visit. The documentation on 09/20/22 for the exam, diagnosis, procedures, and orders are all accurate and complete.  Jacky Kindle, [name]  [facility] [phone] (phone) [phone] (fax)  [facility]

## 2022-09-20 ENCOUNTER — Encounter: Payer: Self-pay | Admitting: Family Medicine

## 2022-09-20 DIAGNOSIS — F39 Unspecified mood [affective] disorder: Secondary | ICD-10-CM | POA: Insufficient documentation

## 2022-09-20 DIAGNOSIS — F5104 Psychophysiologic insomnia: Secondary | ICD-10-CM | POA: Insufficient documentation

## 2022-09-20 DIAGNOSIS — Z7689 Persons encountering health services in other specified circumstances: Secondary | ICD-10-CM | POA: Insufficient documentation

## 2022-09-20 DIAGNOSIS — F410 Panic disorder [episodic paroxysmal anxiety] without agoraphobia: Secondary | ICD-10-CM | POA: Insufficient documentation

## 2022-09-20 NOTE — Assessment & Plan Note (Signed)
Transfer practice d/t location; now living in [facility] Familiar with Nps and APPs

## 2022-09-20 NOTE — Assessment & Plan Note (Signed)
Acute on chronic, increasing stress in the past few years Recommend start of prozac to assist with overall health/stress s/s mood disturbance Recommend use of PRN valium to assist with panic described as inability to take a deep breath and relax

## 2022-09-20 NOTE — Assessment & Plan Note (Signed)
Acute on chronic, worsening Pt denies Depression; however, endorses some cyclic thinking patters and anxiety with panic Start prozac daily to assist Continue to focus on the positive things and glimmers life provides Reassurance provided Pt denies concern for SI or HI at this time; continue to monitor

## 2022-09-20 NOTE — Assessment & Plan Note (Signed)
Chronic, Body mass index is 40.84 kg/m. Upsetting to patient as she doesn't like the way that she looks and feels She doesn't like how she looks in clothes and she is fearful that her role transition at work will worsen her weight d/t lack of ability. She now works at [employer] but does not have the support of her family and husband to assist her with working out She is also under a great deal of stress Defer use of phentermine at this time in setting of panic attacks and  insomnia Pt reports previous use of naltrexone- agreeable to trial that medication at this time to assist with lifestyle to reduce weight to healthier BMI and assist with self esteem and energy levels.

## 2022-09-20 NOTE — Assessment & Plan Note (Signed)
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

## 2022-09-28 ENCOUNTER — Encounter: Payer: Self-pay | Admitting: Family Medicine

## 2022-10-10 ENCOUNTER — Other Ambulatory Visit: Payer: Self-pay | Admitting: Family Medicine

## 2022-10-10 DIAGNOSIS — F39 Unspecified mood [affective] disorder: Secondary | ICD-10-CM

## 2022-10-10 DIAGNOSIS — F5104 Psychophysiologic insomnia: Secondary | ICD-10-CM

## 2022-10-12 ENCOUNTER — Telehealth: Payer: Self-pay | Admitting: Primary Care

## 2022-10-12 DIAGNOSIS — F411 Generalized anxiety disorder: Secondary | ICD-10-CM

## 2022-10-12 NOTE — Telephone Encounter (Signed)
Refill request sent to new PCP.

## 2022-10-13 ENCOUNTER — Other Ambulatory Visit: Payer: Self-pay | Admitting: Family Medicine

## 2022-10-13 DIAGNOSIS — F411 Generalized anxiety disorder: Secondary | ICD-10-CM

## 2022-10-13 MED ORDER — BUPROPION HCL ER (SR) 200 MG PO TB12: 200.0000 mg | ORAL_TABLET | Freq: Two times a day (BID) | ORAL | 0 refills | Status: DC

## 2022-10-24 ENCOUNTER — Other Ambulatory Visit: Payer: Self-pay | Admitting: Primary Care

## 2022-10-24 DIAGNOSIS — R519 Headache, unspecified: Secondary | ICD-10-CM

## 2022-10-24 DIAGNOSIS — G43709 Chronic migraine without aura, not intractable, without status migrainosus: Secondary | ICD-10-CM

## 2022-10-24 DIAGNOSIS — Z3041 Encounter for surveillance of contraceptive pills: Secondary | ICD-10-CM

## 2022-10-27 ENCOUNTER — Ambulatory Visit: Payer: BC Managed Care – PPO | Admitting: Family Medicine

## 2022-10-28 ENCOUNTER — Ambulatory Visit: Payer: BC Managed Care – PPO | Admitting: Family Medicine

## 2022-10-30 ENCOUNTER — Ambulatory Visit: Payer: BC Managed Care – PPO | Admitting: Family Medicine

## 2022-10-30 ENCOUNTER — Encounter: Payer: Self-pay | Admitting: Family Medicine

## 2022-10-30 VITALS — BP 108/63 | HR 88 | Ht 64.0 in | Wt 237.7 lb

## 2022-10-30 DIAGNOSIS — F5104 Psychophysiologic insomnia: Secondary | ICD-10-CM | POA: Diagnosis not present

## 2022-10-30 DIAGNOSIS — F39 Unspecified mood [affective] disorder: Secondary | ICD-10-CM

## 2022-10-30 DIAGNOSIS — F411 Generalized anxiety disorder: Secondary | ICD-10-CM | POA: Diagnosis not present

## 2022-10-30 NOTE — Progress Notes (Signed)
Established patient visit   Patient: Casey Pope   DOB: 05-12-1985   37 y.o. Female  MRN: [id number] Visit Date: 10/30/2022  Today's healthcare provider: Jacky Kindle, [name]  Introduced to nurse practitioner role and practice setting.  All questions answered.  Discussed provider/patient relationship and expectations.  Subjective    HPI   Medications: Outpatient Medications Prior to Visit  Medication Sig   albuterol (VENTOLIN HFA) 108 (90 Base) MCG/ACT inhaler Inhale 2 puffs into the lungs every 6 (six) hours as needed for shortness of breath.   diazepam (VALIUM) 2 MG tablet Take 1 tablet (2 mg total) by mouth every 6 (six) hours as needed for anxiety.   FLUoxetine (PROZAC) 10 MG capsule TAKE 1 CAPSULE BY MOUTH EVERY DAY   SIMPESSE 0.15-0.03 &0.01 MG tablet TAKE 1 TABLET BY MOUTH EVERY DAY   SUMAtriptan (IMITREX) 100 MG tablet Take 1 tablet by mouth at migraine onset, may repeat in 2 hours if headache persists or recurs.   [DISCONTINUED] buPROPion (WELLBUTRIN SR) 200 MG 12 hr tablet Take 1 tablet (200 mg total) by mouth 2 (two) times daily.   [DISCONTINUED] naltrexone (DEPADE) 50 MG tablet Take 0.5 tablets (25 mg total) by mouth daily.   [DISCONTINUED] traZODone (DESYREL) 50 MG tablet TAKE 0.5-1 TABLETS BY MOUTH AT BEDTIME AS NEEDED FOR SLEEP.   [DISCONTINUED] topiramate (TOPAMAX) 50 MG tablet TAKE 1 TABLET BY MOUTH TWICE A DAY (Patient not taking: Reported on 10/30/2022)   No facility-administered medications prior to visit.       Objective    BP 108/63 (BP Location: Left Arm, Patient Position: Sitting, Cuff Size: Large)   Pulse 88   Ht 5\' 4"  (1.626 m)   Wt 237 lb 11.2 oz (107.8 kg)   SpO2 100%   BMI 40.80 kg/m   Physical Exam Vitals and nursing note reviewed.  Constitutional:      General: She is not in acute distress.    Appearance: Normal appearance. She is obese. She is not ill-appearing, toxic-appearing or diaphoretic.  HENT:     Head: Normocephalic  and atraumatic.  Cardiovascular:     Rate and Rhythm: Normal rate and regular rhythm.     Pulses: Normal pulses.     Heart sounds: Normal heart sounds. No murmur heard.    No friction rub. No gallop.  Pulmonary:     Effort: Pulmonary effort is normal. No respiratory distress.     Breath sounds: Normal breath sounds. No stridor. No wheezing, rhonchi or rales.  Chest:     Chest wall: No tenderness.  Musculoskeletal:        General: No swelling, tenderness, deformity or signs of injury. Normal range of motion.     Right lower leg: No edema.     Left lower leg: No edema.  Skin:    General: Skin is warm and dry.     Capillary Refill: Capillary refill takes less than 2 seconds.     Coloration: Skin is not jaundiced or pale.     Findings: No bruising, erythema, lesion or rash.  Neurological:     General: No focal deficit present.     Mental Status: She is alert and oriented to person, place, and time. Mental status is at baseline.     Cranial Nerves: No cranial nerve deficit.     Sensory: No sensory deficit.     Motor: No weakness.     Coordination: Coordination normal.  Psychiatric:  Mood and Affect: Mood normal.        Behavior: Behavior normal.        Thought Content: Thought content normal.        Judgment: Judgment normal.     No results found for any visits on 10/30/22.  Assessment & Plan     Problem List Items Addressed This Visit       Other   Generalized anxiety disorder - Primary    Chronic, improved; has not needed valium  Continue to focus on mentality shift and small wins Denies SI or HI      Relevant Medications   buPROPion (WELLBUTRIN SR) 200 MG 12 hr tablet   traZODone (DESYREL) 50 MG tablet   Mood disorder (HCC)    Chronic, improved Continue medication to assist      Relevant Medications   traZODone (DESYREL) 50 MG tablet   Morbid obesity (HCC)    Chronic, stable Body mass index is 40.8 kg/m. Continue to recommend balanced, lower carb  meals. Smaller meal size, adding snacks. Choosing water as drink of choice and increasing purposeful exercise.       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     Leilani Merl, [name], have reviewed all documentation for this visit. The documentation on 10/31/22 for the exam, diagnosis, procedures, and orders are all accurate and complete.  Jacky Kindle, [name]  [facility] [phone] (phone) [phone] (fax)  [facility]

## 2022-10-31 MED ORDER — BUPROPION HCL ER (SR) 200 MG PO TB12: 200.0000 mg | ORAL_TABLET | Freq: Two times a day (BID) | ORAL | 3 refills | Status: DC

## 2022-10-31 MED ORDER — TRAZODONE HCL 50 MG PO TABS
25.0000 mg | ORAL_TABLET | Freq: Every evening | ORAL | 3 refills | Status: DC | PRN
Start: 2022-10-31 — End: 2023-01-12

## 2022-10-31 MED ORDER — NALTREXONE HCL 50 MG PO TABS
50.0000 mg | ORAL_TABLET | Freq: Every day | ORAL | 3 refills | Status: DC
Start: 2022-10-31 — End: 2022-11-12

## 2022-10-31 NOTE — Assessment & Plan Note (Signed)
Chronic, improved; has not needed valium  Continue to focus on mentality shift and small wins Denies SI or HI

## 2022-10-31 NOTE — Assessment & Plan Note (Signed)
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

## 2022-10-31 NOTE — Assessment & Plan Note (Signed)
Chronic, stable Body mass index is 40.8 kg/m. Continue to recommend balanced, lower carb meals. Smaller meal size, adding snacks. Choosing water as drink of choice and increasing purposeful exercise.

## 2022-10-31 NOTE — Assessment & Plan Note (Signed)
Chronic, improved Continue medication to assist

## 2022-11-12 ENCOUNTER — Other Ambulatory Visit: Payer: Self-pay | Admitting: Family Medicine

## 2022-11-12 ENCOUNTER — Encounter: Payer: Self-pay | Admitting: Family Medicine

## 2022-11-12 DIAGNOSIS — M25819 Other specified joint disorders, unspecified shoulder: Secondary | ICD-10-CM

## 2022-11-12 MED ORDER — NALTREXONE HCL 50 MG PO TABS
50.0000 mg | ORAL_TABLET | Freq: Every day | ORAL | 3 refills | Status: DC
Start: 2022-11-12 — End: 2023-01-12

## 2022-11-18 ENCOUNTER — Ambulatory Visit: Payer: BC Managed Care – PPO | Admitting: Sports Medicine

## 2022-11-18 ENCOUNTER — Encounter: Payer: Self-pay | Admitting: Sports Medicine

## 2022-11-18 ENCOUNTER — Ambulatory Visit
Admission: RE | Admit: 2022-11-18 | Discharge: 2022-11-18 | Disposition: A | Discharge status: Home / Self Care | Payer: BC Managed Care – PPO | Source: Ambulatory Visit | Attending: Sports Medicine | Admitting: Sports Medicine

## 2022-11-18 VITALS — BP 104/78 | Ht 64.0 in | Wt 230.0 lb

## 2022-11-18 DIAGNOSIS — S46811A Strain of other muscles, fascia and tendons at shoulder and upper arm level, right arm, initial encounter: Secondary | ICD-10-CM | POA: Diagnosis not present

## 2022-11-18 DIAGNOSIS — M5412 Radiculopathy, cervical region: Secondary | ICD-10-CM

## 2022-11-18 MED ORDER — PREDNISONE 20 MG PO TABS: 20.0000 mg | ORAL_TABLET | Freq: Two times a day (BID) | ORAL | 0 refills | Status: DC

## 2022-11-18 NOTE — Patient Instructions (Signed)
You have cervical radiculopathy. We have placed orders for neck x-rays and I will review these results with you once I have them.  Please take the prednisone for 5 days and work on the cervical spine rehabilitation stretches we reviewed during your appointment. Follow-up in 6 weeks and if we are not improving next steps will be to get an MRI of your cervical spine.

## 2022-11-18 NOTE — Progress Notes (Addendum)
PCP: Jacky Kindle, [name]  SUBJECTIVE:   HPI:  Patient is a 37 y.o. female here with chief complaint of right upper extremity pain and paresthesias. She states that about 1 month ago she tripped on the curb and landed on outstretched arms. No head trauma. Following this she has had pain on the right side of her neck and posterior right shoulder. She was given shoulder stretches by her PCP and been doing these without much improvement. This past week however she has had constant numbness and tingling from her elbow to her fingers. This is involving all the digits of the right hand. No associated weakness and no identifiable provocative maneuvers. No prior imaging of right shoulder or neck.  ROS:     See HPI  Past Medical History:  Diagnosis Date   Allergy    Anxiety    Asthma    as a child - rarely uses inhaler   C. difficile colitis 12/01/2016   Colitis 11/29/2016   Depression    Disease of appendix    Lower back pain    ? herniated disc   Plantar fasciitis    Rectal bleeding 03/11/2018    Current Outpatient Medications on File Prior to Visit  Medication Sig Dispense Refill   albuterol (VENTOLIN HFA) 108 (90 Base) MCG/ACT inhaler Inhale 2 puffs into the lungs every 6 (six) hours as needed for shortness of breath. 1 each 0   buPROPion (WELLBUTRIN SR) 200 MG 12 hr tablet Take 1 tablet (200 mg total) by mouth 2 (two) times daily. 180 tablet 3   diazepam (VALIUM) 2 MG tablet Take 1 tablet (2 mg total) by mouth every 6 (six) hours as needed for anxiety. 30 tablet 0   FLUoxetine (PROZAC) 10 MG capsule TAKE 1 CAPSULE BY MOUTH EVERY DAY 90 capsule 1   naltrexone (DEPADE) 50 MG tablet Take 1 tablet (50 mg total) by mouth daily. 90 tablet 3   SIMPESSE 0.15-0.03 &0.01 MG tablet TAKE 1 TABLET BY MOUTH EVERY DAY 91 tablet 0   SUMAtriptan (IMITREX) 100 MG tablet Take 1 tablet by mouth at migraine onset, may repeat in 2 hours if headache persists or recurs. 10 tablet 0   traZODone (DESYREL) 50 MG  tablet Take 0.5 tablets (25 mg total) by mouth at bedtime as needed for sleep. 90 tablet 3   No current facility-administered medications on file prior to visit.    Past Surgical History:  Procedure Laterality Date   CESAREAN SECTION     CESAREAN SECTION WITH BILATERAL TUBAL LIGATION Bilateral 02/27/2014   Procedure: CESAREAN SECTION WITH BILATERAL TUBAL LIGATION;  Surgeon: Mitchel Honour, DO;  Location: [facility];  Service: Obstetrics;  Laterality: Bilateral;  Repeat  edc 03/06/14   CHOLECYSTECTOMY     WISDOM TOOTH EXTRACTION      Allergies  Allergen Reactions   Sulfa Antibiotics Hives   Ciprofloxacin    Clindamycin/Lincomycin Other (See Comments)    C-Diff   Amoxicillin Rash   Nickel Rash     OBJECTIVE:  BP 104/78   Ht 5\' 4"  (1.626 m)   Wt 230 lb (104.3 kg)   BMI 39.48 kg/m   PHYSICAL EXAM:  GEN: Alert and Oriented, NAD, comfortable in exam room RESP: Unlabored respirations, symmetric chest rise PSY: normal mood, congruent affect   MSK EXAM Neck and RUE: No gross deformity, swelling, bruising. TTP along posterior trap, no cervical midline/bony or paraspinal TTP. No TTP along RUE. FROM of neck, R shoulder, elbow and wrist/hand.  BUE strength 5/5.   Sensation intact to light touch.   2+ equal reflexes in triceps, biceps, brachioradialis tendons. Positive spurlings. NV intact distal BUEs.   ASSESSMENT & PLAN:  1. Cervical radiculopathy 2. Trapezius strain, right, initial encounter  Hx and exam findings most c/w cervical radiculopathy as explanation of her right arm paresthesias given distribution appears to be involving C5-C8. Plain films of the cervical spine were obtained today and personally reviewed which show loss of cervical lordosis curvature, however no acute bony abnormalities or fractures. Plan to do a 5 day Prednisone burst and start cervical spine HEP. Exercises were reviewed and handout provided. Follow-up in 6 weeks and if failing to improve would need to get  MRI of cervical spine vs nerve conduction study. Patient's questions were answered and is in agreement with this plan.   Glean Salen, MD PGY-4, Sports Medicine Fellow [facility]  Addendum:  Patient seen in the office by fellow.  His history, exam, plan of care were precepted with me.  Norton Blizzard MD Marrianne Mood

## 2022-11-20 NOTE — Addendum Note (Signed)
Addended by: Lenda Kelp on: 11/20/2022 10:12 AM   Modules accepted: Level of Service

## 2023-01-04 ENCOUNTER — Ambulatory Visit: Payer: BC Managed Care – PPO | Admitting: Family Medicine

## 2023-01-04 VITALS — BP 106/72 | Ht 64.0 in | Wt 220.0 lb

## 2023-01-04 DIAGNOSIS — M5412 Radiculopathy, cervical region: Secondary | ICD-10-CM | POA: Diagnosis not present

## 2023-01-05 ENCOUNTER — Encounter: Payer: Self-pay | Admitting: Family Medicine

## 2023-01-05 NOTE — Progress Notes (Signed)
PCP: Jacky Kindle, [name]  Subjective:   HPI: Patient is a 37 y.o. female here for right arm pain, numbness.  8/21: She states that about 1 month ago she tripped on the curb and landed on outstretched arms. No head trauma. Following this she has had pain on the right side of her neck and posterior right shoulder. She was given shoulder stretches by her PCP and been doing these without much improvement. This past week however she has had constant numbness and tingling from her elbow to her fingers. This is involving all the digits of the right hand. No associated weakness and no identifiable provocative maneuvers. No prior imaging of right shoulder or neck.  10/7: Patient reports she feels about the same compared to last visit. She took prednisone which maybe helped temporarily. Still has issues with right arm going numb. Improves with reaching right arm behind her or overhead. Pain associated is sharp. Numbness wraps down her right arm to below the elbow into fingers. Doing home exercises provided at last visit. No bowel/bladder dysfunction.  Past Medical History:  Diagnosis Date   Allergy    Anxiety    Asthma    as a child - rarely uses inhaler   C. difficile colitis 12/01/2016   Colitis 11/29/2016   Depression    Disease of appendix    Lower back pain    ? herniated disc   Plantar fasciitis    Rectal bleeding 03/11/2018    Current Outpatient Medications on File Prior to Visit  Medication Sig Dispense Refill   albuterol (VENTOLIN HFA) 108 (90 Base) MCG/ACT inhaler Inhale 2 puffs into the lungs every 6 (six) hours as needed for shortness of breath. 1 each 0   buPROPion (WELLBUTRIN SR) 200 MG 12 hr tablet Take 1 tablet (200 mg total) by mouth 2 (two) times daily. 180 tablet 3   diazepam (VALIUM) 2 MG tablet Take 1 tablet (2 mg total) by mouth every 6 (six) hours as needed for anxiety. 30 tablet 0   FLUoxetine (PROZAC) 10 MG capsule TAKE 1 CAPSULE BY MOUTH EVERY DAY 90 capsule 1    naltrexone (DEPADE) 50 MG tablet Take 1 tablet (50 mg total) by mouth daily. 90 tablet 3   predniSONE (DELTASONE) 20 MG tablet Take 1 tablet (20 mg total) by mouth 2 (two) times daily. 10 tablet 0   SIMPESSE 0.15-0.03 &0.01 MG tablet TAKE 1 TABLET BY MOUTH EVERY DAY 91 tablet 0   SUMAtriptan (IMITREX) 100 MG tablet Take 1 tablet by mouth at migraine onset, may repeat in 2 hours if headache persists or recurs. 10 tablet 0   traZODone (DESYREL) 50 MG tablet Take 0.5 tablets (25 mg total) by mouth at bedtime as needed for sleep. 90 tablet 3   No current facility-administered medications on file prior to visit.    Past Surgical History:  Procedure Laterality Date   CESAREAN SECTION     CESAREAN SECTION WITH BILATERAL TUBAL LIGATION Bilateral 02/27/2014   Procedure: CESAREAN SECTION WITH BILATERAL TUBAL LIGATION;  Surgeon: Mitchel Honour, DO;  Location: [facility];  Service: Obstetrics;  Laterality: Bilateral;  Repeat  edc 03/06/14   CHOLECYSTECTOMY     WISDOM TOOTH EXTRACTION      Allergies  Allergen Reactions   Sulfa Antibiotics Hives   Ciprofloxacin    Clindamycin/Lincomycin Other (See Comments)    C-Diff   Amoxicillin Rash   Nickel Rash    BP 106/72   Ht 5\' 4"  (1.626 m)  Wt 220 lb (99.8 kg)   BMI 37.76 kg/m       No data to display              No data to display              Objective:  Physical Exam:  Gen: NAD, comfortable in exam room  Neck: No gross deformity, swelling, bruising. TTP mildly right trapezius.  No tenderness upper extremities.  No midline/bony TTP. FROM. BUE strength 5/5. Sensation intact to light touch. Trace right brachioradialis reflex, 2+ left brachioradialis, bilateral triceps, biceps,  reflexes. Negative spurlings.   Assessment & Plan:  1. Cervical radiculopathy - not improving with isometric home exercises, prednisone dose pack.  Radiographs negative.  Will proceed with MRI of cervical spine to assess for source of radiculopathy -  consider epidural steroid injections, surgical referral depending on results. Aleve, tylenol if needed in meantime.  Continue home exercises.

## 2023-01-12 ENCOUNTER — Encounter: Payer: Self-pay | Admitting: Family Medicine

## 2023-01-12 ENCOUNTER — Ambulatory Visit: Payer: BC Managed Care – PPO | Admitting: Family Medicine

## 2023-01-12 VITALS — BP 106/57 | HR 85 | Temp 98.2°F | Ht 64.0 in | Wt 239.7 lb

## 2023-01-12 DIAGNOSIS — Z6841 Body Mass Index (BMI) 40.0 and over, adult: Secondary | ICD-10-CM

## 2023-01-12 DIAGNOSIS — Z0000 Encounter for general adult medical examination without abnormal findings: Secondary | ICD-10-CM | POA: Insufficient documentation

## 2023-01-12 DIAGNOSIS — F39 Unspecified mood [affective] disorder: Secondary | ICD-10-CM

## 2023-01-12 DIAGNOSIS — Z0001 Encounter for general adult medical examination with abnormal findings: Secondary | ICD-10-CM

## 2023-01-12 DIAGNOSIS — Z23 Encounter for immunization: Secondary | ICD-10-CM | POA: Diagnosis not present

## 2023-01-12 DIAGNOSIS — Z3041 Encounter for surveillance of contraceptive pills: Secondary | ICD-10-CM

## 2023-01-12 MED ORDER — NALTREXONE HCL 50 MG PO TABS
100.0000 mg | ORAL_TABLET | Freq: Every day | ORAL | 0 refills | Status: DC
Start: 2023-01-12 — End: 2023-03-22

## 2023-01-12 MED ORDER — LEVONORGEST-ETH ESTRAD 91-DAY 0.15-0.03 &0.01 MG PO TABS: 1.0000 | ORAL_TABLET | Freq: Every day | ORAL | 4 refills | Status: AC

## 2023-01-12 NOTE — Assessment & Plan Note (Signed)

## 2023-01-12 NOTE — Assessment & Plan Note (Signed)
Chronic, improved Continue wellbutrin 200 mg BID; stop prozac 10 mg Continue naltrexone- increase form 50 to 100 mg; d/c trazodone previously at 25 mg prn

## 2023-01-12 NOTE — Assessment & Plan Note (Signed)
Chronic use; no concerns Continue to monitor

## 2023-01-12 NOTE — Progress Notes (Signed)
Complete physical exam  Patient: Casey Pope   DOB: Aug 09, 1985   37 y.o. Female  MRN: [id number] Visit Date: 01/12/2023  Today's healthcare provider: Jacky Kindle, [name]  Re-introduced to nurse practitioner role and practice setting.  All questions answered.  Discussed provider/patient relationship and expectations.  Chief Complaint  Patient presents with   Annual Exam    Would like to discuss weight management    Subjective    Casey Pope is a 37 y.o. female who presents today for a complete physical exam.  She reports consuming a general diet. The patient has a physically strenuous job, but has no regular exercise apart from work.  She generally feels fairly well. She reports sleeping fairly well. She does have additional problems to discuss today.  HPI HPI     Annual Exam    Additional comments: Would like to discuss weight management       Last edited by Rolly Salter, CMA on 01/12/2023  2:49 PM.      The patient, with a history of weight issues and asthma, presents for an annual check-up. The patient's main concern is her weight, and she expresses interest in discussing her current medication, naltrexone. She has been on a 50mg  dose for a while and feels she has reached a plateau in her weight loss. She is interested in increasing the dosage.  The patient also mentions a recent lifestyle change due to a new job. This change has led to improved sleep and reduced stress levels. She has taken trazodone only twice since her last visit and feels she no longer needs it. The patient is also taking Wellbutrin and birth control, and has stopped taking Prozac due to feeling "weird" on it.  The patient has a history of asthma but reports it has been well-controlled. She has an inhaler for use when around cats, to which she is very allergic. The patient also mentions a recent trip to [facility] for a work conference.  Past Medical History:  Diagnosis  Date   Allergy    Anxiety    Asthma    as a child - rarely uses inhaler   C. difficile colitis 12/01/2016   Colitis 11/29/2016   Depression    Disease of appendix    Lower back pain    ? herniated disc   Plantar fasciitis    Rectal bleeding 03/11/2018   Past Surgical History:  Procedure Laterality Date   CESAREAN SECTION     CESAREAN SECTION WITH BILATERAL TUBAL LIGATION Bilateral 02/27/2014   Procedure: CESAREAN SECTION WITH BILATERAL TUBAL LIGATION;  Surgeon: Mitchel Honour, DO;  Location: [facility];  Service: Obstetrics;  Laterality: Bilateral;  Repeat  edc 03/06/14   CHOLECYSTECTOMY     WISDOM TOOTH EXTRACTION     Social History   Socioeconomic History   Marital status: Married    Spouse name: Not on file   Number of children: Not on file   Years of education: Not on file   Highest education level: Master's degree (e.g., MA, MS, MEng, MEd, MSW, MBA)  Occupational History   Not on file  Tobacco Use   Smoking status: Never   Smokeless tobacco: Never  Vaping Use   Vaping status: Not on file  Substance and Sexual Activity   Alcohol use: Yes    Alcohol/week: 2.0 standard drinks of alcohol    Types: 2 Glasses of wine per week    Comment: socially but none  with pregnancy   Drug use: No   Sexual activity: Yes    Birth control/protection: Pill, Surgical, None    Comment: PILL USED FOR MIGRAINES  Other Topics Concern   Not on file  Social History Narrative   Married.   Has 3 three children.   Completed Masters degree, works as a Runner, broadcasting/film/video.   Enjoys spending time with her family.   Social Determinants of Health   Financial Resource Strain: Low Risk  (09/17/2022)   Overall Financial Resource Strain (CARDIA)    Difficulty of Paying Living Expenses: Not very hard  Food Insecurity: No Food Insecurity (09/17/2022)   Hunger Vital Sign    Worried About Running Out of Food in the Last Year: Never true    Ran Out of Food in the Last Year: Never true  Transportation Needs: No  Transportation Needs (09/17/2022)   PRAPARE - Administrator, Civil Service (Medical): No    Lack of Transportation (Non-Medical): No  Physical Activity: Insufficiently Active (09/17/2022)   Exercise Vital Sign    Days of Exercise per Week: 3 days    Minutes of Exercise per Session: 30 min  Stress: Stress Concern Present (09/17/2022)   [employer] of Occupational Health - Occupational Stress Questionnaire    Feeling of Stress : Very much  Social Connections: Moderately Integrated (09/17/2022)   Social Connection and Isolation Panel [NHANES]    Frequency of Communication with Friends and Family: Twice a week    Frequency of Social Gatherings with Friends and Family: Once a week    Attends Religious Services: More than 4 times per year    Active Member of [employer] or Organizations: No    Attends Engineer, structural: Not on file    Marital Status: Married  Catering manager Violence: Not on file   Family Status  Relation Name Status   Mother Lupita Leash Alive   Father Contractor   Sister  (Not Specified)   Daughter  (Not Specified)   MGM  Alive   MGF Donald Deceased   PGM Josephine Alive   PGF  Deceased   Neg Hx  (Not Specified)  No partnership data on file   Family History  Problem Relation Age of Onset   Hypertension Mother    Diabetes Mother    Depression Mother    Arthritis Father    ADD / ADHD Sister    ADD / ADHD Daughter    Anxiety disorder Daughter    Lung cancer Maternal Grandfather 99 - 79       smoker   Macular degeneration Paternal Grandmother    Heart attack Paternal Grandfather 93 - 69   Inflammatory bowel disease Neg Hx    Allergies  Allergen Reactions   Sulfa Antibiotics Hives   Ciprofloxacin    Clindamycin/Lincomycin Other (See Comments)    C-Diff   Amoxicillin Rash   Nickel Rash    Patient Care Team: Jacky Kindle, [name] as PCP - General (Family Medicine)   Medications: Outpatient Medications Prior to Visit  Medication Sig    buPROPion (WELLBUTRIN SR) 200 MG 12 hr tablet Take 1 tablet (200 mg total) by mouth 2 (two) times daily.   [DISCONTINUED] albuterol (VENTOLIN HFA) 108 (90 Base) MCG/ACT inhaler Inhale 2 puffs into the lungs every 6 (six) hours as needed for shortness of breath.   [DISCONTINUED] diazepam (VALIUM) 2 MG tablet Take 1 tablet (2 mg total) by mouth every 6 (six) hours as needed for anxiety.   [  DISCONTINUED] FLUoxetine (PROZAC) 10 MG capsule TAKE 1 CAPSULE BY MOUTH EVERY DAY   [DISCONTINUED] naltrexone (DEPADE) 50 MG tablet Take 1 tablet (50 mg total) by mouth daily.   [DISCONTINUED] predniSONE (DELTASONE) 20 MG tablet Take 1 tablet (20 mg total) by mouth 2 (two) times daily.   [DISCONTINUED] SIMPESSE 0.15-0.03 &0.01 MG tablet TAKE 1 TABLET BY MOUTH EVERY DAY   [DISCONTINUED] SUMAtriptan (IMITREX) 100 MG tablet Take 1 tablet by mouth at migraine onset, may repeat in 2 hours if headache persists or recurs.   [DISCONTINUED] traZODone (DESYREL) 50 MG tablet Take 0.5 tablets (25 mg total) by mouth at bedtime as needed for sleep. (Patient not taking: Reported on 01/12/2023)   No facility-administered medications prior to visit.    Review of Systems    Objective    BP (!) 106/57 (BP Location: Left Arm, Patient Position: Sitting, Cuff Size: Large)   Pulse 85   Temp 98.2 F (36.8 C) (Oral)   Ht 5\' 4"  (1.626 m)   Wt 239 lb 11.2 oz (108.7 kg)   SpO2 100%   BMI 41.14 kg/m    Physical Exam Vitals and nursing note reviewed.  Constitutional:      General: She is awake. She is not in acute distress.    Appearance: Normal appearance. She is well-developed and well-groomed. She is obese. She is not ill-appearing, toxic-appearing or diaphoretic.  HENT:     Head: Normocephalic and atraumatic.     Jaw: There is normal jaw occlusion. No trismus, tenderness, swelling or pain on movement.     Right Ear: Hearing, tympanic membrane, ear canal and external ear normal. There is no impacted cerumen.     Left Ear:  Hearing, tympanic membrane, ear canal and external ear normal. There is no impacted cerumen.     Nose: Nose normal. No congestion or rhinorrhea.     Right Turbinates: Not enlarged, swollen or pale.     Left Turbinates: Not enlarged, swollen or pale.     Right Sinus: No maxillary sinus tenderness or frontal sinus tenderness.     Left Sinus: No maxillary sinus tenderness or frontal sinus tenderness.     Mouth/Throat:     Lips: Pink.     Mouth: Mucous membranes are moist. No injury.     Tongue: No lesions.     Pharynx: Oropharynx is clear. Uvula midline. No pharyngeal swelling, oropharyngeal exudate, posterior oropharyngeal erythema or uvula swelling.     Tonsils: No tonsillar exudate or tonsillar abscesses.  Eyes:     General: Lids are normal. Lids are everted, no foreign bodies appreciated. Vision grossly intact. Gaze aligned appropriately. No allergic shiner or visual field deficit.       Right eye: No discharge.        Left eye: No discharge.     Extraocular Movements: Extraocular movements intact.     Conjunctiva/sclera: Conjunctivae normal.     Right eye: Right conjunctiva is not injected. No exudate.    Left eye: Left conjunctiva is not injected. No exudate.    Pupils: Pupils are equal, round, and reactive to light.  Neck:     Thyroid: No thyroid mass, thyromegaly or thyroid tenderness.     Vascular: No carotid bruit.     Trachea: Trachea normal.     Comments: Chronic R arm tightness from neck with residual arm pain; f/b sports med- plan for MRI Cardiovascular:     Rate and Rhythm: Normal rate and regular rhythm.  Pulses: Normal pulses.          Carotid pulses are 2+ on the right side and 2+ on the left side.      Radial pulses are 2+ on the right side and 2+ on the left side.       Dorsalis pedis pulses are 2+ on the right side and 2+ on the left side.       Posterior tibial pulses are 2+ on the right side and 2+ on the left side.     Heart sounds: Normal heart sounds, S1  normal and S2 normal. No murmur heard.    No friction rub. No gallop.  Pulmonary:     Effort: Pulmonary effort is normal. No respiratory distress.     Breath sounds: Normal breath sounds and air entry. No stridor. No wheezing, rhonchi or rales.  Chest:     Chest wall: No tenderness.  Abdominal:     General: Abdomen is flat. Bowel sounds are normal. There is no distension.     Palpations: Abdomen is soft. There is no mass.     Tenderness: There is no abdominal tenderness. There is no right CVA tenderness, left CVA tenderness, guarding or rebound.     Hernia: No hernia is present.  Genitourinary:    Comments: Exam deferred; denies complaints Musculoskeletal:        General: No swelling, tenderness, deformity or signs of injury. Normal range of motion.     Cervical back: Full passive range of motion without pain, normal range of motion and neck supple. No edema, rigidity or tenderness. No muscular tenderness.     Right lower leg: No edema.     Left lower leg: No edema.  Lymphadenopathy:     Cervical: No cervical adenopathy.     Right cervical: No superficial, deep or posterior cervical adenopathy.    Left cervical: No superficial, deep or posterior cervical adenopathy.  Skin:    General: Skin is warm and dry.     Capillary Refill: Capillary refill takes less than 2 seconds.     Coloration: Skin is not jaundiced or pale.     Findings: No bruising, erythema, lesion or rash.  Neurological:     General: No focal deficit present.     Mental Status: She is alert and oriented to person, place, and time. Mental status is at baseline.     GCS: GCS eye subscore is 4. GCS verbal subscore is 5. GCS motor subscore is 6.     Sensory: Sensation is intact. No sensory deficit.     Motor: Motor function is intact. No weakness.     Coordination: Coordination is intact. Coordination normal.     Gait: Gait is intact. Gait normal.  Psychiatric:        Attention and Perception: Attention and perception  normal.        Mood and Affect: Mood and affect normal.        Speech: Speech normal.        Behavior: Behavior normal. Behavior is cooperative.        Thought Content: Thought content normal.        Cognition and Memory: Cognition and memory normal.        Judgment: Judgment normal.     Last depression screening scores    10/30/2022    1:11 PM 09/18/2022   10:31 AM 05/13/2021    8:50 AM  PHQ 2/9 Scores  PHQ - 2 Score 1 4 0  PHQ- 9 Score 8 14    Last fall risk screening    10/30/2022    1:11 PM  Fall Risk   Falls in the past year? 0  Injury with Fall? 0  Risk for fall due to : No Fall Risks  Follow up Falls evaluation completed   Last Audit-C alcohol use screening    09/18/2022   10:31 AM  Alcohol Use Disorder Test (AUDIT)  1. How often do you have a drink containing alcohol? 2  2. How many drinks containing alcohol do you have on a typical day when you are drinking? 0  3. How often do you have six or more drinks on one occasion? 0  AUDIT-C Score 2   A score of 3 or more in women, and 4 or more in men indicates increased risk for alcohol abuse, EXCEPT if all of the points are from question 1   No results found for any visits on 01/12/23.  Assessment & Plan    Routine Health Maintenance and Physical Exam  Exercise Activities and Dietary recommendations  Goals   None     Immunization History  Administered Date(s) Administered   DTaP 12/05/2013   Influenza, Seasonal, Injecte, Preservative Fre 01/12/2023   Influenza,inj,Quad PF,6+ Mos 01/21/2017, 04/01/2018, 05/08/2022   Influenza-Unspecified 01/10/2020, 01/28/2021   PFIZER(Purple Top)SARS-COV-2 Vaccination 05/28/2019, 06/19/2019   Tdap 05/30/2014    Health Maintenance  Topic Date Due   COVID-19 Vaccine (3 - 2023-24 season) 11/29/2022   DTaP/Tdap/Td (3 - Td or Tdap) 05/29/2024   Cervical Cancer Screening (HPV/Pap Cotest)  03/19/2025   INFLUENZA VACCINE  Completed   Hepatitis C Screening  Completed   HIV  Screening  Completed   HPV VACCINES  Aged Out    Discussed health benefits of physical activity, and encouraged her to engage in regular exercise appropriate for her age and condition.  Weight Management Patient reports a plateau in weight loss while on Naltrexone 50mg . Discussed increasing the dose. -Increase Naltrexone to 100mg  daily (two 50mg  tablets). -Use current supply and double up. New prescription sent to pharmacy.  Sleep Disturbance Improvement in sleep pattern after lifestyle changes and job switch. Trazodone used sparingly and no longer needed. -Discontinue Trazodone.  Depression Patient reports feeling good on current regimen of Wellbutrin. Discontinued Prozac due to adverse effects. -Continue Wellbutrin. -Remove Prozac from medication list.  Contraception No concerns with current birth control. -Refill birth control prescription at [facility] in [place].  Asthma Controlled, only triggered by exposure to cats. Has an old inhaler. -Ensure patient has a current inhaler for emergencies.  Neck Pain Ongoing issue, possibly related to neck. MRI scheduled. Discussed potential benefit of breast reduction, but patient prefers to wait for MRI results. -Follow up on MRI results.  General Health Maintenance -Perform labs today for baseline. -Continue breast self-checks at home. -Follow up with sports medicine for neck pain. -Encourage dental check-up. -Flu vaccine already administered.  Follow-up No immediate follow-up appointment needed. Patient to update on response to increased Naltrexone dose.   Leilani Merl, [name], have reviewed all documentation for this visit. The documentation on 01/12/23 for the exam, diagnosis, procedures, and orders are all accurate and complete.  Jacky Kindle, [name]  [facility] [phone] (phone) [phone] (fax)  [facility]

## 2023-01-12 NOTE — Assessment & Plan Note (Signed)
Chronic, Body mass index is 41.14 kg/m. Continue to recommend balanced, lower carb meals. Smaller meal size, adding snacks. Choosing water as drink of choice and increasing purposeful exercise. Increase natrexone from 50 to 100 mg to assist with lifestyle changes

## 2023-01-13 LAB — LIPID PANEL
Chol/HDL Ratio: 2.3 {ratio} (ref 0.0–4.4)
Cholesterol, Total: 130 mg/dL (ref 100–199)
HDL: 56 mg/dL (ref >39)
LDL Chol Calc (NIH): 55 mg/dL (ref 0–99)
Triglycerides: 105 mg/dL (ref 0–149)
VLDL Cholesterol Cal: 19 mg/dL (ref 5–40)

## 2023-01-13 LAB — VITAMIN D 25 HYDROXY (VIT D DEFICIENCY, FRACTURES): Vit D, 25-Hydroxy: 29.4 ng/mL — ABNORMAL LOW (ref 30.0–100.0)

## 2023-01-13 LAB — COMPREHENSIVE METABOLIC PANEL
ALT: 8 [IU]/L (ref 0–32)
AST: 9 [IU]/L (ref 0–40)
Albumin: 4 g/dL (ref 3.9–4.9)
Alkaline Phosphatase: 85 [IU]/L (ref 44–121)
BUN/Creatinine Ratio: 12 (ref 9–23)
BUN: 9 mg/dL (ref 6–20)
Bilirubin Total: 0.3 mg/dL (ref 0.0–1.2)
CO2: 23 mmol/L (ref 20–29)
Calcium: 9 mg/dL (ref 8.7–10.2)
Chloride: 106 mmol/L (ref 96–106)
Creatinine, Ser: 0.74 mg/dL (ref 0.57–1.00)
Globulin, Total: 2.9 g/dL (ref 1.5–4.5)
Glucose: 90 mg/dL (ref 70–99)
Potassium: 4.3 mmol/L (ref 3.5–5.2)
Sodium: 143 mmol/L (ref 134–144)
Total Protein: 6.9 g/dL (ref 6.0–8.5)
eGFR: 107 mL/min/{1.73_m2} (ref >59)

## 2023-01-13 LAB — CBC WITH DIFFERENTIAL/PLATELET
Basophils Absolute: 0.1 10*3/uL (ref 0.0–0.2)
Basos: 1 %
EOS (ABSOLUTE): 0.1 10*3/uL (ref 0.0–0.4)
Eos: 2 %
Hematocrit: 41.7 % (ref 34.0–46.6)
Hemoglobin: 13.4 g/dL (ref 11.1–15.9)
Immature Grans (Abs): 0 10*3/uL (ref 0.0–0.1)
Immature Granulocytes: 0 %
Lymphocytes Absolute: 2.5 10*3/uL (ref 0.7–3.1)
Lymphs: 38 %
MCH: 29.9 pg (ref 26.6–33.0)
MCHC: 32.1 g/dL (ref 31.5–35.7)
MCV: 93 fL (ref 79–97)
Monocytes Absolute: 0.3 10*3/uL (ref 0.1–0.9)
Monocytes: 5 %
Neutrophils Absolute: 3.6 10*3/uL (ref 1.4–7.0)
Neutrophils: 54 %
Platelets: 299 10*3/uL (ref 150–450)
RBC: 4.48 x10E6/uL (ref 3.77–5.28)
RDW: 12.3 % (ref 11.7–15.4)
WBC: 6.7 10*3/uL (ref 3.4–10.8)

## 2023-01-13 LAB — HEMOGLOBIN A1C
Est. average glucose Bld gHb Est-mCnc: 100 mg/dL
Hgb A1c MFr Bld: 5.1 % (ref 4.8–5.6)

## 2023-01-13 LAB — TSH: TSH: 1.4 u[IU]/mL (ref 0.450–4.500)

## 2023-01-20 ENCOUNTER — Encounter: Payer: Self-pay | Admitting: Family Medicine

## 2023-01-20 ENCOUNTER — Other Ambulatory Visit: Payer: Self-pay | Admitting: Family Medicine

## 2023-03-01 ENCOUNTER — Telehealth: Payer: BC Managed Care – PPO | Admitting: Physician Assistant

## 2023-03-01 ENCOUNTER — Ambulatory Visit: Payer: Self-pay

## 2023-03-01 DIAGNOSIS — J208 Acute bronchitis due to other specified organisms: Secondary | ICD-10-CM | POA: Diagnosis not present

## 2023-03-01 DIAGNOSIS — B9689 Other specified bacterial agents as the cause of diseases classified elsewhere: Secondary | ICD-10-CM | POA: Diagnosis not present

## 2023-03-01 MED ORDER — AZITHROMYCIN 250 MG PO TABS: ORAL_TABLET | ORAL | 0 refills | Status: AC

## 2023-03-01 MED ORDER — FLUTICASONE PROPIONATE 50 MCG/ACT NA SUSP: 2.0000 | Freq: Every day | NASAL | 0 refills | Status: DC

## 2023-03-01 MED ORDER — BENZONATATE 100 MG PO CAPS: 100.0000 mg | ORAL_CAPSULE | Freq: Three times a day (TID) | ORAL | 0 refills | Status: DC | PRN

## 2023-03-01 MED ORDER — ALBUTEROL SULFATE HFA 108 (90 BASE) MCG/ACT IN AERS
2.0000 | INHALATION_SPRAY | Freq: Four times a day (QID) | RESPIRATORY_TRACT | 0 refills | Status: DC | PRN
Start: 2023-03-01 — End: 2023-03-27

## 2023-03-01 NOTE — Patient Instructions (Signed)
Jacinto Halim Earnest Conroy, thank you for joining Piedad Climes, PA-C for today's virtual visit.  While this provider is not your primary care provider (PCP), if your PCP is located in our provider database this encounter information will be shared with them immediately following your visit.   A [place] MyChart account gives you access to today's visit and all your visits, tests, and labs performed at [facility] " click here if you don't have a [facility] MyChart account or go to [email or web address]  Consent: (Patient) Casey Pope provided verbal consent for this virtual visit at the beginning of the encounter.  Current Medications:  Current Outpatient Medications:    buPROPion (WELLBUTRIN SR) 200 MG 12 hr tablet, Take 1 tablet (200 mg total) by mouth 2 (two) times daily., Disp: 180 tablet, Rfl: 3   Levonorgestrel-Ethinyl Estradiol (SIMPESSE) 0.15-0.03 &0.01 MG tablet, Take 1 tablet by mouth daily., Disp: 91 tablet, Rfl: 4   naltrexone (DEPADE) 50 MG tablet, Take 2 tablets (100 mg total) by mouth daily., Disp: 180 tablet, Rfl: 0   Medications ordered in this encounter:  No orders of the defined types were placed in this encounter.    *If you need refills on other medications prior to your next appointment, please contact your pharmacy*  Follow-Up: Call back or seek an in-person evaluation if the symptoms worsen or if the condition fails to improve as anticipated.  [facility] [phone]  Other Instructions Take antibiotic (Azithromycin) as directed.  Increase fluids.  Get plenty of rest. Use Mucinex for congestion. Use the Tessalon and albuterol as directed. Take a daily probiotic (I recommend Align or Culturelle, but even Activia Yogurt may be beneficial).  A humidifier placed in the bedroom may offer some relief for a dry, scratchy throat of nasal irritation.  Read information below on acute bronchitis. Please call or  return to clinic if symptoms are not improving.  Acute Bronchitis Bronchitis is when the airways that extend from the windpipe into the lungs get red, puffy, and painful (inflamed). Bronchitis often causes thick spit (mucus) to develop. This leads to a cough. A cough is the most common symptom of bronchitis. In acute bronchitis, the condition usually begins suddenly and goes away over time (usually in 2 weeks). Smoking, allergies, and asthma can make bronchitis worse. Repeated episodes of bronchitis may cause more lung problems.  HOME CARE Rest. Drink enough fluids to keep your pee (urine) clear or pale yellow (unless you need to limit fluids as told by your doctor). Only take over-the-counter or prescription medicines as told by your doctor. Avoid smoking and secondhand smoke. These can make bronchitis worse. If you are a smoker, think about using nicotine gum or skin patches. Quitting smoking will help your lungs heal faster. Reduce the chance of getting bronchitis again by: Washing your hands often. Avoiding people with cold symptoms. Trying not to touch your hands to your mouth, nose, or eyes. Follow up with your doctor as told.  GET HELP IF: Your symptoms do not improve after 1 week of treatment. Symptoms include: Cough. Fever. Coughing up thick spit. Body aches. Chest congestion. Chills. Shortness of breath. Sore throat.  GET HELP RIGHT AWAY IF:  You have an increased fever. You have chills. You have severe shortness of breath. You have bloody thick spit (sputum). You throw up (vomit) often. You lose too much body fluid (dehydration). You have a severe headache. You faint.  MAKE SURE YOU:  Understand these  instructions. Will watch your condition. Will get help right away if you are not doing well or get worse. Document Released: 09/02/2007 Document Revised: 11/16/2012 Document Reviewed: 09/06/2012 [facility] Patient Information 2015 [place], [place]. This information is  not intended to replace advice given to you by your health care provider. Make sure you discuss any questions you have with your health care provider.    If you have been instructed to have an in-person evaluation today at a local Urgent Care facility, please use the link below. It will take you to a list of all of our available [facility], including address, phone number and hours of operation. Please do not delay care.  [facility]  If you or a family member do not have a primary care provider, use the link below to schedule a visit and establish care. When you choose a [facility] primary care physician or advanced practice provider, you gain a long-term partner in health. Find a Primary Care Provider  Learn more about [place]'s in-office and virtual care options: [facility] - Get Care Now

## 2023-03-01 NOTE — Progress Notes (Signed)
Virtual Visit Consent   Casey Pope, you are scheduled for a virtual visit with a [facility] provider today. Just as with appointments in the office, your consent must be obtained to participate. Your consent will be active for this visit and any virtual visit you may have with one of our providers in the next 365 days. If you have a MyChart account, a copy of this consent can be sent to you electronically.  As this is a virtual visit, video technology does not allow for your provider to perform a traditional examination. This may limit your provider's ability to fully assess your condition. If your provider identifies any concerns that need to be evaluated in person or the need to arrange testing (such as labs, EKG, etc.), we will make arrangements to do so. Although advances in technology are sophisticated, we cannot ensure that it will always work on either your end or our end. If the connection with a video visit is poor, the visit may have to be switched to a telephone visit. With either a video or telephone visit, we are not always able to ensure that we have a secure connection.  By engaging in this virtual visit, you consent to the provision of healthcare and authorize for your insurance to be billed (if applicable) for the services provided during this visit. Depending on your insurance coverage, you may receive a charge related to this service.  I need to obtain your verbal consent now. Are you willing to proceed with your visit today? Casey Pope has provided verbal consent on 03/01/2023 for a virtual visit (video or telephone). Piedad Climes, [place]  Date: 03/01/2023 2:30 PM  Virtual Visit via Video Note   I, Piedad Climes, connected with  Casey Pope  ([id number], [birth year]) on 03/01/23 at  2:15 PM EST by a video-enabled telemedicine application and verified that I am speaking with the correct person using two  identifiers.  Location: Patient: Virtual Visit Location Patient: Home Provider: Virtual Visit Location Provider: Home Office   I discussed the limitations of evaluation and management by telemedicine and the availability of in person appointments. The patient expressed understanding and agreed to proceed.    History of Present Illness: Casey Pope is a 37 y.o. who identifies as a female who was assigned female at birth, and is being seen today for shortness of breath with exertion over the past several days. This has been associated with rhinorrhea, headache and sinus pressure.  Symptoms continue to progress with the chest tightness, wheezing and SOBOE. Notes history of asthma, but not an issue since childhood. Negative COVID.  OTC -- Dayquil, Tylenol, Zyrtec   HPI: HPI  Problems:  Patient Active Problem List   Diagnosis Date Noted   Annual physical exam 01/12/2023   Flu vaccine need 01/12/2023   Morbid obesity with BMI of 40.0-44.9, adult (HCC) 01/12/2023   Morbid obesity (HCC) 09/20/2022   Mood disorder (HCC) 09/20/2022   Psychophysiological insomnia 09/20/2022   Encounter for birth control pills maintenance 03/19/2020    Allergies:  Allergies  Allergen Reactions   Ciprofloxacin    Sulfa Antibiotics Hives   Clindamycin/Lincomycin Other (See Comments)    C-Diff   Amoxicillin Rash   Nickel Rash   Medications:  Current Outpatient Medications:    albuterol (VENTOLIN HFA) 108 (90 Base) MCG/ACT inhaler, Inhale 2 puffs into the lungs every 6 (six) hours as needed for wheezing or shortness of breath., Disp:  8 g, Rfl: 0   azithromycin (ZITHROMAX) 250 MG tablet, Take 2 tablets on day 1, then 1 tablet daily on days 2 through 5, Disp: 6 tablet, Rfl: 0   benzonatate (TESSALON) 100 MG capsule, Take 1 capsule (100 mg total) by mouth 3 (three) times daily as needed for cough., Disp: 30 capsule, Rfl: 0   fluticasone (FLONASE) 50 MCG/ACT nasal spray, Place 2 sprays into both  nostrils daily., Disp: 16 g, Rfl: 0   buPROPion (WELLBUTRIN SR) 200 MG 12 hr tablet, Take 1 tablet (200 mg total) by mouth 2 (two) times daily., Disp: 180 tablet, Rfl: 3   Levonorgestrel-Ethinyl Estradiol (SIMPESSE) 0.15-0.03 &0.01 MG tablet, Take 1 tablet by mouth daily., Disp: 91 tablet, Rfl: 4   naltrexone (DEPADE) 50 MG tablet, Take 2 tablets (100 mg total) by mouth daily., Disp: 180 tablet, Rfl: 0  Observations/Objective: Patient is well-developed, well-nourished in no acute distress.  Resting comfortably at home.  Head is normocephalic, atraumatic.  No labored breathing. Speech is clear and coherent with logical content.  Patient is alert and oriented at baseline.   Assessment and Plan: 1. Acute bacterial bronchitis - fluticasone (FLONASE) 50 MCG/ACT nasal spray; Place 2 sprays into both nostrils daily.  Dispense: 16 g; Refill: 0 - azithromycin (ZITHROMAX) 250 MG tablet; Take 2 tablets on day 1, then 1 tablet daily on days 2 through 5  Dispense: 6 tablet; Refill: 0 - albuterol (VENTOLIN HFA) 108 (90 Base) MCG/ACT inhaler; Inhale 2 puffs into the lungs every 6 (six) hours as needed for wheezing or shortness of breath.  Dispense: 8 g; Refill: 0 - benzonatate (TESSALON) 100 MG capsule; Take 1 capsule (100 mg total) by mouth 3 (three) times daily as needed for cough.  Dispense: 30 capsule; Refill: 0  Supportive measures and OTC medications reviewed. Start Albuterol, Flonase and Tessalon. Giving her recent exposure to walking pneumonia and progressive symptoms, will add on Azithromycin as a precaution.   Follow Up Instructions: I discussed the assessment and treatment plan with the patient. The patient was provided an opportunity to ask questions and all were answered. The patient agreed with the plan and demonstrated an understanding of the instructions.  A copy of instructions were sent to the patient via MyChart unless otherwise noted below.   The patient was advised to call back or seek  an in-person evaluation if the symptoms worsen or if the condition fails to improve as anticipated.    Piedad Climes, PA-C

## 2023-03-01 NOTE — Telephone Encounter (Signed)
  Chief Complaint: wheezing Symptoms: sneezing, fatigue, mild SOB Frequency: Friday Pertinent Negatives: Patient denies dizziness, runny nose, chest pain fever Disposition: [] ED /[] Urgent Care (no appt availability in office) / [] Appointment(In office/virtual)/ [x]  [facility]/ [] Home Care/ [] Refused Recommended Disposition /[] [facility]/ []  Follow-up with PCP Additional Notes: no appts- office closed for lunch -  Reason for Disposition  [1] MILD difficulty breathing (e.g., minimal/no SOB at rest, SOB with walking, pulse <100) AND [2] NEW-onset or WORSE than normal  Answer Assessment - Initial Assessment Questions 1. RESPIRATORY STATUS: "Describe your breathing?" (e.g., wheezing, shortness of breath, unable to speak, severe coughing)      wheezing 2. ONSET: "When did this breathing problem begin?"      Friday  3. PATTERN "Does the difficult breathing come and go, or has it been constant since it started?"      yes 4. SEVERITY: "How bad is your breathing?" (e.g., mild, moderate, severe)    - MILD: No SOB at rest, mild SOB with walking, speaks normally in sentences, can lie down, no retractions, pulse < 100.    - MODERATE: SOB at rest, SOB with minimal exertion and prefers to sit, cannot lie down flat, speaks in phrases, mild retractions, audible wheezing, pulse 100-120.    - SEVERE: Very SOB at rest, speaks in single words, struggling to breathe, sitting hunched forward, retractions, pulse > 120      mild  6. CARDIAC HISTORY: "Do you have any history of heart disease?" (e.g., heart attack, angina, bypass surgery, angioplasty)      no 7. LUNG HISTORY: "Do you have any history of lung disease?"  (e.g., pulmonary embolus, asthma, emphysema)     Asthma as a child 8. CAUSE: "What do you think is causing the breathing problem?"      Daughter had walking pna 9. OTHER SYMPTOMS: "Do you have any other symptoms? (e.g., dizziness, runny nose, cough, chest pain, fever)      Sneezing, fatigue  Protocols used: Breathing Difficulty-A-AH

## 2023-03-03 NOTE — Telephone Encounter (Signed)
Patient has already been seen and is being treated

## 2023-03-13 ENCOUNTER — Encounter: Payer: Self-pay | Admitting: Family Medicine

## 2023-03-22 ENCOUNTER — Telehealth: Payer: Self-pay | Admitting: Family Medicine

## 2023-03-22 ENCOUNTER — Ambulatory Visit: Payer: BC Managed Care – PPO | Admitting: Family Medicine

## 2023-03-22 VITALS — BP 112/72 | HR 83 | Ht 64.0 in | Wt 246.0 lb

## 2023-03-22 DIAGNOSIS — F458 Other somatoform disorders: Secondary | ICD-10-CM | POA: Diagnosis not present

## 2023-03-22 DIAGNOSIS — R0681 Apnea, not elsewhere classified: Secondary | ICD-10-CM

## 2023-03-22 DIAGNOSIS — F39 Unspecified mood [affective] disorder: Secondary | ICD-10-CM

## 2023-03-22 DIAGNOSIS — R0683 Snoring: Secondary | ICD-10-CM

## 2023-03-22 MED ORDER — TIRZEPATIDE-WEIGHT MANAGEMENT 5 MG/0.5ML ~~LOC~~ SOAJ: 5.0000 mg | SUBCUTANEOUS | 0 refills | Status: DC

## 2023-03-22 MED ORDER — PHENTERMINE HCL 37.5 MG PO CAPS: 37.5000 mg | ORAL_CAPSULE | ORAL | 0 refills | Status: DC

## 2023-03-22 NOTE — Telephone Encounter (Signed)
Received fax for prior auth on Phentermine HCI 37.5 mg.   Key:  [id number]

## 2023-03-25 ENCOUNTER — Encounter: Payer: Self-pay | Admitting: Family Medicine

## 2023-03-26 ENCOUNTER — Other Ambulatory Visit: Payer: Self-pay | Admitting: Family Medicine

## 2023-03-26 MED ORDER — SEMAGLUTIDE-WEIGHT MANAGEMENT 1 MG/0.5ML ~~LOC~~ SOAJ: 1.0000 mg | SUBCUTANEOUS | 0 refills | Status: DC

## 2023-03-27 DIAGNOSIS — R0683 Snoring: Secondary | ICD-10-CM | POA: Insufficient documentation

## 2023-03-27 DIAGNOSIS — F458 Other somatoform disorders: Secondary | ICD-10-CM | POA: Insufficient documentation

## 2023-03-27 DIAGNOSIS — R0681 Apnea, not elsewhere classified: Secondary | ICD-10-CM | POA: Insufficient documentation

## 2023-03-27 NOTE — Assessment & Plan Note (Signed)
Chronic, noted by spouse Recommend sleep study to assist

## 2023-03-27 NOTE — Progress Notes (Signed)
Established patient visit   Patient: Casey Pope   DOB: 07/17/1985   37 y.o. Female  MRN: [id number] Visit Date: 03/22/2023  Today's healthcare provider: Jacky Kindle, [name]  Introduced to nurse practitioner role and practice setting.  All questions answered.  Discussed provider/patient relationship and expectations.  Chief Complaint  Patient presents with   sleep apnea    Pt husband stated--snoring, fatigue,grinding teeth --10 years    Subjective    HPI HPI     sleep apnea    Additional comments: Pt husband stated--snoring, fatigue,grinding teeth --10 years       Last edited by Shelly Bombard, CMA on 03/22/2023  2:28 PM.      Medications: Outpatient Medications Prior to Visit  Medication Sig   buPROPion (WELLBUTRIN SR) 200 MG 12 hr tablet Take 1 tablet (200 mg total) by mouth 2 (two) times daily.   Levonorgestrel-Ethinyl Estradiol (SIMPESSE) 0.15-0.03 &0.01 MG tablet Take 1 tablet by mouth daily.   [DISCONTINUED] albuterol (VENTOLIN HFA) 108 (90 Base) MCG/ACT inhaler Inhale 2 puffs into the lungs every 6 (six) hours as needed for wheezing or shortness of breath.   [DISCONTINUED] fluticasone (FLONASE) 50 MCG/ACT nasal spray Place 2 sprays into both nostrils daily.   [DISCONTINUED] benzonatate (TESSALON) 100 MG capsule Take 1 capsule (100 mg total) by mouth 3 (three) times daily as needed for cough. (Patient not taking: Reported on 03/22/2023)   [DISCONTINUED] naltrexone (DEPADE) 50 MG tablet Take 2 tablets (100 mg total) by mouth daily. (Patient not taking: Reported on 03/22/2023)   No facility-administered medications prior to visit.   Last CBC Lab Results  Component Value Date   WBC 6.7 01/12/2023   HGB 13.4 01/12/2023   HCT 41.7 01/12/2023   MCV 93 01/12/2023   MCH 29.9 01/12/2023   RDW 12.3 01/12/2023   PLT 299 01/12/2023   Last metabolic panel Lab Results  Component Value Date   GLUCOSE 90 01/12/2023   NA 143 01/12/2023   K 4.3  01/12/2023   CL 106 01/12/2023   CO2 23 01/12/2023   BUN 9 01/12/2023   CREATININE 0.74 01/12/2023   EGFR 107 01/12/2023   CALCIUM 9.0 01/12/2023   PROT 6.9 01/12/2023   ALBUMIN 4.0 01/12/2023   LABGLOB 2.9 01/12/2023   BILITOT 0.3 01/12/2023   ALKPHOS 85 01/12/2023   AST 9 01/12/2023   ALT 8 01/12/2023   ANIONGAP 3 (L) 12/02/2016   Last lipids Lab Results  Component Value Date   CHOL 130 01/12/2023   HDL 56 01/12/2023   LDLCALC 55 01/12/2023   TRIG 105 01/12/2023   CHOLHDL 2.3 01/12/2023   Last hemoglobin A1c Lab Results  Component Value Date   HGBA1C 5.1 01/12/2023   Last thyroid functions Lab Results  Component Value Date   TSH 1.400 01/12/2023   Last vitamin D Lab Results  Component Value Date   VD25OH 29.4 (L) 01/12/2023     Objective    BP 112/72 (BP Location: Left Arm, Patient Position: Sitting, Cuff Size: Large)   Pulse 83   Ht 5\' 4"  (1.626 m)   Wt 246 lb (111.6 kg)   BMI 42.23 kg/m   BP Readings from Last 3 Encounters:  03/22/23 112/72  01/12/23 (!) 106/57  01/04/23 106/72   Wt Readings from Last 3 Encounters:  03/22/23 246 lb (111.6 kg)  01/12/23 239 lb 11.2 oz (108.7 kg)  01/04/23 220 lb (99.8 kg)   SpO2 Readings from  Last 3 Encounters:  01/12/23 100%  10/30/22 100%  09/18/22 100%   Physical Exam Vitals and nursing note reviewed.  Constitutional:      General: She is not in acute distress.    Appearance: Normal appearance. She is obese. She is not ill-appearing, toxic-appearing or diaphoretic.  HENT:     Head: Normocephalic and atraumatic.  Cardiovascular:     Rate and Rhythm: Normal rate and regular rhythm.     Pulses: Normal pulses.     Heart sounds: Normal heart sounds. No murmur heard.    No friction rub. No gallop.  Pulmonary:     Effort: Pulmonary effort is normal. No respiratory distress.     Breath sounds: Normal breath sounds. No stridor. No wheezing, rhonchi or rales.  Chest:     Chest wall: No tenderness.   Musculoskeletal:        General: No swelling, tenderness, deformity or signs of injury. Normal range of motion.     Right lower leg: No edema.     Left lower leg: No edema.  Skin:    General: Skin is warm and dry.     Capillary Refill: Capillary refill takes less than 2 seconds.     Coloration: Skin is not jaundiced or pale.     Findings: No bruising, erythema, lesion or rash.  Neurological:     General: No focal deficit present.     Mental Status: She is alert and oriented to person, place, and time. Mental status is at baseline.     Cranial Nerves: No cranial nerve deficit.     Sensory: No sensory deficit.     Motor: No weakness.     Coordination: Coordination normal.  Psychiatric:        Mood and Affect: Mood normal.        Behavior: Behavior normal.        Thought Content: Thought content normal.        Judgment: Judgment normal.        03/22/2023    2:00 PM  Results of the Epworth flowsheet  Sitting and reading 0  Watching TV 2  Sitting, inactive in a public place (e.g. a theatre or a meeting) 0  As a passenger in a car for an hour without a break 3  Lying down to rest in the afternoon when circumstances permit 3  Sitting and talking to someone 0  Sitting quietly after a lunch without alcohol 2  In a car, while stopped for a few minutes in traffic 0  Total score 10    No results found for any visits on 03/22/23.  Assessment & Plan     Problem List Items Addressed This Visit       Digestive   Teeth grinding   Chronic, noted by spouse Recommend sleep study to assist      Relevant Orders   Home sleep test     Other   Loud snoring - Primary   Chronic, noted by spouse and patient Recommend sleep study to assist Chronic obesity, mood disorder and borderline BP as co morbid conditions      Relevant Orders   Home sleep test   Mood disorder (HCC)   Acute on chronic, improved    03/22/2023    2:28 PM 09/18/2022   10:35 AM 12/11/2019    1:43 PM 06/01/2017     4:42 PM  GAD 7 : Generalized Anxiety Score  Nervous, Anxious, on Edge 2 3 3  3  Control/stop worrying 1 3 1 3   Worry too much - different things 1 3 1 3   Trouble relaxing 2 3 1 2   Restless 0 2 0 2  Easily annoyed or irritable 2 3 1 3   Afraid - awful might happen 0 0 0 0  Total GAD 7 Score 8 17 7 16   Anxiety Difficulty Somewhat difficult Very difficult Very difficult Somewhat difficult      03/22/2023    2:27 PM 10/30/2022    1:11 PM 09/18/2022   10:31 AM  PHQ9 SCORE ONLY  PHQ-9 Total Score 7 8 14     Continue on BID dosing of Wellbutrin at 200 mg SR Continue to monitor      Morbid obesity (HCC)   Chronic, Body mass index is 42.23 kg/m. Discussed importance of healthy weight management Discussed diet and exercise Discussed trial of phentermine to assist vs compound use of GLP vs self pay option of GLP       Relevant Medications   phentermine 37.5 MG capsule   Other Relevant Orders   Home sleep test   Witnessed episode of apnea   Chronic, noted by spouse Recommend sleep study to assist See epworth for patient report of s/s      Relevant Orders   Home sleep test   Return in about 3 months (around 06/20/2023), or if symptoms worsen or fail to improve, for chonic disease management.     Leilani Merl, [name], have reviewed all documentation for this visit. The documentation on 03/27/23 for the exam, diagnosis, procedures, and orders are all accurate and complete.  Jacky Kindle, [name]  [facility] [phone] (phone) [phone] (fax)  [facility]

## 2023-03-27 NOTE — Assessment & Plan Note (Signed)
Chronic, noted by spouse Recommend sleep study to assist See epworth for patient report of s/s

## 2023-03-27 NOTE — Assessment & Plan Note (Signed)
Acute on chronic, improved    03/22/2023    2:28 PM 09/18/2022   10:35 AM 12/11/2019    1:43 PM 06/01/2017    4:42 PM  GAD 7 : Generalized Anxiety Score  Nervous, Anxious, on Edge 2 3 3 3   Control/stop worrying 1 3 1 3   Worry too much - different things 1 3 1 3   Trouble relaxing 2 3 1 2   Restless 0 2 0 2  Easily annoyed or irritable 2 3 1 3   Afraid - awful might happen 0 0 0 0  Total GAD 7 Score 8 17 7 16   Anxiety Difficulty Somewhat difficult Very difficult Very difficult Somewhat difficult      03/22/2023    2:27 PM 10/30/2022    1:11 PM 09/18/2022   10:31 AM  PHQ9 SCORE ONLY  PHQ-9 Total Score 7 8 14     Continue on BID dosing of Wellbutrin at 200 mg SR Continue to monitor

## 2023-03-27 NOTE — Assessment & Plan Note (Signed)
Chronic, noted by spouse and patient Recommend sleep study to assist Chronic obesity, mood disorder and borderline BP as co morbid conditions

## 2023-03-27 NOTE — Assessment & Plan Note (Signed)
Chronic, Body mass index is 42.23 kg/m. Discussed importance of healthy weight management Discussed diet and exercise Discussed trial of phentermine to assist vs compound use of GLP vs self pay option of GLP

## 2023-04-02 ENCOUNTER — Ambulatory Visit: Payer: BC Managed Care – PPO | Admitting: Family Medicine

## 2023-04-07 NOTE — Telephone Encounter (Signed)
 GLP1 dispensed to patient will not do PA for Phentermine while on GLP1

## 2023-04-27 NOTE — Progress Notes (Unsigned)
There were no vitals taken for this visit.   Subjective:    Patient ID: Casey Pope, female    DOB: Jan 28, 1986, 38 y.o.   MRN: [id number]  HPI: Casey Pope is a 38 y.o. female  No chief complaint on file.  Patient presents to clinic to establish care with new PCP.  Introduced to Publishing rights manager role and practice setting.  All questions answered.  Discussed provider/patient relationship and expectations.  Patient reports a history of ***. Patient denies a history of: Hypertension, Elevated Cholesterol, Diabetes, Thyroid problems, Depression, Anxiety, Neurological problems, and Abdominal problems.   Active Ambulatory Problems    Diagnosis Date Noted   Morbid obesity (HCC) 09/20/2022   Mood disorder (HCC) 09/20/2022   Loud snoring 03/27/2023   Teeth grinding 03/27/2023   Witnessed episode of apnea 03/27/2023   Resolved Ambulatory Problems    Diagnosis Date Noted   S/P cesarean section 02/27/2014   Generalized anxiety disorder 08/02/2014   Low back pain 08/02/2014   Frequent headaches 08/02/2014   Morbid obesity with BMI of 40.0-44.9, adult (HCC) 08/02/2014   Acute pyelonephritis 04/30/2015   Acute non-recurrent sinusitis 12/02/2015   Colitis 11/29/2016   Hypokalemia 11/29/2016   Disease of appendix    C. difficile colitis 12/01/2016   Preventative health care 01/21/2017   Rectal bleeding 03/11/2018   Hemorrhoids 03/11/2018   Lower abdominal pain 03/11/2018   Sore throat 02/06/2020   Encounter for birth control pills maintenance 03/19/2020   Urinary urgency 03/19/2020   Asthma 05/13/2021   Chronic migraine without aura without status migrainosus, not intractable 09/15/2021   Heat intolerance 09/15/2021   Glucosuria 11/20/2021   Lesion of left eyelid 05/08/2022   Psychophysiological insomnia 09/20/2022   Panic attack due to exceptional stress 09/20/2022   Encounter to establish care 09/20/2022   Annual physical exam 01/12/2023   Flu  vaccine need 01/12/2023   Morbid obesity with BMI of 40.0-44.9, adult (HCC) 01/12/2023   Past Medical History:  Diagnosis Date   Allergy    Anxiety    Depression    Lower back pain    Plantar fasciitis    Past Surgical History:  Procedure Laterality Date   CESAREAN SECTION     CESAREAN SECTION WITH BILATERAL TUBAL LIGATION Bilateral 02/27/2014   Procedure: CESAREAN SECTION WITH BILATERAL TUBAL LIGATION;  Surgeon: Mitchel Honour, DO;  Location: [facility];  Service: Obstetrics;  Laterality: Bilateral;  Repeat  edc 03/06/14   CHOLECYSTECTOMY     WISDOM TOOTH EXTRACTION     Family History  Problem Relation Age of Onset   Hypertension Mother    Diabetes Mother    Depression Mother    Arthritis Father    ADD / ADHD Sister    ADD / ADHD Daughter    Anxiety disorder Daughter    Lung cancer Maternal Grandfather 4 - 63       smoker   Macular degeneration Paternal Grandmother    Heart attack Paternal Grandfather 63 - 69   Inflammatory bowel disease Neg Hx      Review of Systems  Per HPI unless specifically indicated above     Objective:    There were no vitals taken for this visit.  Wt Readings from Last 3 Encounters:  03/22/23 246 lb (111.6 kg)  01/12/23 239 lb 11.2 oz (108.7 kg)  01/04/23 220 lb (99.8 kg)    Physical Exam  Results for orders placed or performed in visit on 01/12/23  CBC  with Differential/Platelet   Collection Time: 01/12/23  3:12 PM  Result Value Ref Range   WBC 6.7 3.4 - 10.8 x10E3/uL   RBC 4.48 3.77 - 5.28 x10E6/uL   Hemoglobin 13.4 11.1 - 15.9 g/dL   Hematocrit 04.5 40.9 - 46.6 %   MCV 93 79 - 97 fL   MCH 29.9 26.6 - 33.0 pg   MCHC 32.1 31.5 - 35.7 g/dL   RDW 81.1 91.4 - 78.2 %   Platelets 299 150 - 450 x10E3/uL   Neutrophils 54 Not Estab. %   Lymphs 38 Not Estab. %   Monocytes 5 Not Estab. %   Eos 2 Not Estab. %   Basos 1 Not Estab. %   Neutrophils Absolute 3.6 1.4 - 7.0 x10E3/uL   Lymphocytes Absolute 2.5 0.7 - 3.1 x10E3/uL   Monocytes  Absolute 0.3 0.1 - 0.9 x10E3/uL   EOS (ABSOLUTE) 0.1 0.0 - 0.4 x10E3/uL   Basophils Absolute 0.1 0.0 - 0.2 x10E3/uL   Immature Granulocytes 0 Not Estab. %   Immature Grans (Abs) 0.0 0.0 - 0.1 x10E3/uL  Comprehensive Metabolic Panel (CMET)   Collection Time: 01/12/23  3:12 PM  Result Value Ref Range   Glucose 90 70 - 99 mg/dL   BUN 9 6 - 20 mg/dL   Creatinine, Ser 9.56 0.57 - 1.00 mg/dL   eGFR 213 >08 MV/HQI/6.96   BUN/Creatinine Ratio 12 9 - 23   Sodium 143 134 - 144 mmol/L   Potassium 4.3 3.5 - 5.2 mmol/L   Chloride 106 96 - 106 mmol/L   CO2 23 20 - 29 mmol/L   Calcium 9.0 8.7 - 10.2 mg/dL   Total Protein 6.9 6.0 - 8.5 g/dL   Albumin 4.0 3.9 - 4.9 g/dL   Globulin, Total 2.9 1.5 - 4.5 g/dL   Bilirubin Total 0.3 0.0 - 1.2 mg/dL   Alkaline Phosphatase 85 44 - 121 IU/L   AST 9 0 - 40 IU/L   ALT 8 0 - 32 IU/L  TSH   Collection Time: 01/12/23  3:12 PM  Result Value Ref Range   TSH 1.400 0.450 - 4.500 uIU/mL  Hemoglobin A1c   Collection Time: 01/12/23  3:12 PM  Result Value Ref Range   Hgb A1c MFr Bld 5.1 4.8 - 5.6 %   Est. average glucose Bld gHb Est-mCnc 100 mg/dL  Vitamin D (25 hydroxy)   Collection Time: 01/12/23  3:12 PM  Result Value Ref Range   Vit D, 25-Hydroxy 29.4 (L) 30.0 - 100.0 ng/mL  Lipid panel   Collection Time: 01/12/23  3:12 PM  Result Value Ref Range   Cholesterol, Total 130 100 - 199 mg/dL   Triglycerides 295 0 - 149 mg/dL   HDL 56 >28 mg/dL   VLDL Cholesterol Cal 19 5 - 40 mg/dL   LDL Chol Calc (NIH) 55 0 - 99 mg/dL   Chol/HDL Ratio 2.3 0.0 - 4.4 ratio      Assessment & Plan:   Problem List Items Addressed This Visit   None    Follow up plan: No follow-ups on file.

## 2023-04-28 ENCOUNTER — Encounter: Payer: Self-pay | Admitting: Nurse Practitioner

## 2023-04-28 ENCOUNTER — Ambulatory Visit: Payer: 59 | Admitting: Nurse Practitioner

## 2023-04-28 DIAGNOSIS — R0681 Apnea, not elsewhere classified: Secondary | ICD-10-CM | POA: Diagnosis not present

## 2023-04-28 DIAGNOSIS — R0683 Snoring: Secondary | ICD-10-CM

## 2023-04-28 DIAGNOSIS — F39 Unspecified mood [affective] disorder: Secondary | ICD-10-CM

## 2023-04-28 DIAGNOSIS — Z7689 Persons encountering health services in other specified circumstances: Secondary | ICD-10-CM

## 2023-04-28 NOTE — Assessment & Plan Note (Signed)
Chronic.  Controlled.  Continue with current medication regimen of Wellbutrin.  Return to clinic in 6 months for reevaluation.  Call sooner if concerns arise.

## 2023-04-28 NOTE — Assessment & Plan Note (Signed)
Chronic.  On Semaglutide 0.5mg  weekly.  Has lost about 9lbs since starting the medication.  Continue with current dose.  Advised to increase water intake.  Can use fiber to help with constipation.  Also recommend increasing protein intake.  Follow up in 2 months.  Call sooner if concerns arise.

## 2023-05-11 ENCOUNTER — Encounter: Payer: Self-pay | Admitting: Nurse Practitioner

## 2023-05-11 NOTE — Telephone Encounter (Signed)
Hey, Can you make me a copy so I can have some of them as well.

## 2023-05-14 ENCOUNTER — Telehealth: Payer: 59 | Admitting: Physician Assistant

## 2023-05-14 DIAGNOSIS — A084 Viral intestinal infection, unspecified: Secondary | ICD-10-CM

## 2023-05-14 MED ORDER — ONDANSETRON 4 MG PO TBDP: 4.0000 mg | ORAL_TABLET | Freq: Three times a day (TID) | ORAL | 0 refills | Status: DC | PRN

## 2023-05-14 NOTE — Progress Notes (Signed)

## 2023-05-25 ENCOUNTER — Encounter: Payer: Self-pay | Admitting: Neurology

## 2023-05-25 ENCOUNTER — Ambulatory Visit: Payer: 59 | Admitting: Neurology

## 2023-05-25 VITALS — BP 100/68 | HR 81 | Ht 64.0 in | Wt 234.0 lb

## 2023-05-25 DIAGNOSIS — Z9189 Other specified personal risk factors, not elsewhere classified: Secondary | ICD-10-CM

## 2023-05-25 DIAGNOSIS — G4719 Other hypersomnia: Secondary | ICD-10-CM

## 2023-05-25 DIAGNOSIS — G4763 Sleep related bruxism: Secondary | ICD-10-CM | POA: Diagnosis not present

## 2023-05-25 DIAGNOSIS — R519 Headache, unspecified: Secondary | ICD-10-CM

## 2023-05-25 DIAGNOSIS — R0683 Snoring: Secondary | ICD-10-CM

## 2023-05-25 DIAGNOSIS — R0681 Apnea, not elsewhere classified: Secondary | ICD-10-CM | POA: Diagnosis not present

## 2023-05-25 NOTE — Progress Notes (Signed)
 Subjective:    Patient ID: Casey Pope is a 38 y.o. female.  HPI    Huston Foley, MD, PhD [facility] [address], [address] [address] [place], [place] [address]  Dear Clydie Braun,   I saw your patient, Casey Pope, upon your kind request in my sleep clinic today for initial consultation of her sleep disorder, in particular, concern for underlying obstructive sleep apnea.  The patient is unaccompanied today.  As you know, Casey Pope is a 38 year old female with an underlying medical history of allergies, asthma, anxiety, depression, history of colitis, plantar fasciitis, and severe recently with a BMI of over 40, who reports snoring and excessive daytime somnolence for years, she is noticed worsening over time and has had weight gain over time.  She has had witnessed apneas per husband's feedback.  Her Epworth sleepiness score is 16 out of 24, fatigue severity score is 41 out of 63.  She is familiar with sleep apnea and CPAP therapy as her husband has a machine.  She is not aware of any family history of sleep apnea.  She lives with her husband and 2 children, ages 29 and 47.  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  She works at [employer]  Her bedtime is generally between 9 and 9:30 PM and rise time is around 5:30 AM.  She occasionally wakes up with a headache.  She also has a history of migraines.  She denies nightly nocturia.  She drinks limited caffeine, 1 cup of coffee in the morning, 1 soda or tea during the day, she he is a non-smoker and drinks alcohol rarely.  They have 3 dogs in the household, one of the dogs often sleeps in the room and on the bed at times.  They have a TV in the bedroom but it is typically not on at night.  She has a history of bruxism per husband's feedback but has not been treated with an occlusive device.  Her Past Medical History Is Significant For: Past Medical  History:  Diagnosis Date   Allergy    Anxiety    Asthma    as a child - rarely uses inhaler   C. difficile colitis 12/01/2016   Colitis 11/29/2016   Depression    Disease of appendix    Lower back pain    ? herniated disc   Plantar fasciitis    Rectal bleeding 03/11/2018    Her Past Surgical History Is Significant For: Past Surgical History:  Procedure Laterality Date   CESAREAN SECTION     CESAREAN SECTION WITH BILATERAL TUBAL LIGATION Bilateral 02/27/2014   Procedure: CESAREAN SECTION WITH BILATERAL TUBAL LIGATION;  Surgeon: Mitchel Honour, DO;  Location: [facility];  Service: Obstetrics;  Laterality: Bilateral;  Repeat  edc 03/06/14   CHOLECYSTECTOMY     TUBAL LIGATION     WISDOM TOOTH EXTRACTION      Her Family History Is Significant For: Family History  Problem Relation Age of Onset   Hypertension Mother    Diabetes Mother    Depression Mother    Obesity Mother    Arthritis Father    ADD / ADHD Sister    ADD / ADHD Daughter    Anxiety disorder Daughter    Lung cancer Maternal Grandfather 31 - 85       smoker   Macular degeneration Paternal Grandmother    Vision loss Paternal Grandmother  Heart attack Paternal Grandfather 25 - 69   Inflammatory bowel disease Neg Hx     Her Social History Is Significant For: Social History   Socioeconomic History   Marital status: Married    Spouse name: Everlean Alstrom   Number of children: 3   Years of education: Not on file   Highest education level: Master's degree (e.g., MA, MS, MEng, MEd, MSW, MBA)  Occupational History   Not on file  Tobacco Use   Smoking status: Never   Smokeless tobacco: Never  Vaping Use   Vaping status: Not on file  Substance and Sexual Activity   Alcohol use: Yes    Alcohol/week: 1.0 standard drink of alcohol    Types: 1 Glasses of wine per week    Comment: socially   Drug use: No   Sexual activity: Yes    Birth control/protection: Surgical  Other Topics Concern   Not on file  Social History  Narrative   Married.   Has 3 three children.   Completed Masters degree, works as a Runner, broadcasting/film/video.   Enjoys spending time with her family.   Social Drivers of Corporate investment banker Strain: Low Risk  (03/22/2023)   Overall Financial Resource Strain (CARDIA)    Difficulty of Paying Living Expenses: Not very hard  Food Insecurity: No Food Insecurity (03/22/2023)   Hunger Vital Sign    Worried About Running Out of Food in the Last Year: Never true    Ran Out of Food in the Last Year: Never true  Transportation Needs: No Transportation Needs (03/22/2023)   PRAPARE - Administrator, Civil Service (Medical): No    Lack of Transportation (Non-Medical): No  Physical Activity: Insufficiently Active (03/22/2023)   Exercise Vital Sign    Days of Exercise per Week: 2 days    Minutes of Exercise per Session: 30 min  Stress: Stress Concern Present (03/22/2023)   [employer] of Occupational Health - Occupational Stress Questionnaire    Feeling of Stress : Rather much  Social Connections: Moderately Integrated (03/22/2023)   Social Connection and Isolation Panel [NHANES]    Frequency of Communication with Friends and Family: More than three times a week    Frequency of Social Gatherings with Friends and Family: Once a week    Attends Religious Services: More than 4 times per year    Active Member of [employer] or Organizations: No    Attends Engineer, structural: Not on file    Marital Status: Married    Her Allergies Are:  Allergies  Allergen Reactions   Ciprofloxacin    Sulfa Antibiotics Hives   Clindamycin/Lincomycin Other (See Comments)    C-Diff   Amoxicillin Rash   Nickel Rash  :   Her Current Medications Are:  Outpatient Encounter Medications as of 05/25/2023  Medication Sig   buPROPion (WELLBUTRIN SR) 200 MG 12 hr tablet Take 1 tablet (200 mg total) by mouth 2 (two) times daily.   cholecalciferol (VITAMIN D3) 25 MCG (1000 UNIT) tablet Take 3,000 Units by  mouth daily.   Levonorgestrel-Ethinyl Estradiol (SIMPESSE) 0.15-0.03 &0.01 MG tablet Take 1 tablet by mouth daily.   ondansetron (ZOFRAN-ODT) 4 MG disintegrating tablet Take 1 tablet (4 mg total) by mouth every 8 (eight) hours as needed.   Semaglutide-Weight Management 1 MG/0.5ML SOAJ Inject 1 mg into the skin once a week.   traZODone (DESYREL) 50 MG tablet Take 25 mg by mouth at bedtime as needed.   No facility-administered encounter  medications on file as of 05/25/2023.  :   Review of Systems:  Out of a complete 14 point review of systems, all are reviewed and negative with the exception of these symptoms as listed below:  Review of Systems  Neurological:        Pt in rm #5 and alone. Patient states she been having lack of sleep. Patient states she will take trazodone to help her sleep but she toss and turns all night. Patient husband states she grins her teeth and snore. ESS-16 & FSS-41    Objective:  Neurological Exam  Physical Exam Physical Examination:   Vitals:   05/25/23 1524  BP: 100/68  Pulse: 81    General Examination: The patient is a very pleasant 38 y.o. female in no acute distress. She appears well-developed and well-nourished and well groomed.   HEENT: Normocephalic, atraumatic, pupils are equal, round and reactive to light, extraocular tracking is good without limitation to gaze excursion or nystagmus noted. Hearing is grossly intact. Face is symmetric with normal facial animation. Speech is clear with no dysarthria noted. There is no hypophonia. There is no lip, neck/head, jaw or voice tremor. Neck is supple with full range of passive and active motion. There are no carotid bruits on auscultation. Oropharynx exam reveals: mild mouth dryness, good dental hygiene and moderate airway crowding, due to small airway entry, tonsillar size about 1-2+, Mallampati class I, tongue protrudes centrally and palate elevates symmetrically, neck circumference 15 three-quarter inches,  minimal overbite noted.  Chest: Clear to auscultation without wheezing, rhonchi or crackles noted.  Heart: S1+S2+0, regular and normal without murmurs, rubs or gallops noted.   Abdomen: Soft, non-tender and non-distended.  Extremities: There is no pitting edema in the distal lower extremities bilaterally.   Skin: Warm and dry without trophic changes noted.   Musculoskeletal: exam reveals no obvious joint deformities.   Neurologically:  Mental status: The patient is awake, alert and oriented in all 4 spheres. Her immediate and remote memory, attention, language skills and fund of knowledge are appropriate. There is no evidence of aphasia, agnosia, apraxia or anomia. Speech is clear with normal prosody and enunciation. Thought process is linear. Mood is normal and affect is normal.  Cranial nerves II - XII are as described above under HEENT exam.  Motor exam: Normal bulk, strength and tone is noted. There is no obvious action or resting tremor.  Fine motor skills and coordination: grossly intact.  Cerebellar testing: No dysmetria or intention tremor. There is no truncal or gait ataxia.  Sensory exam: intact to light touch in the upper and lower extremities.  Gait, station and balance: She stands easily. No veering to one side is noted. No leaning to one side is noted. Posture is age-appropriate and stance is narrow based. Gait shows normal stride length and normal pace. No problems turning are noted.   Assessment and Plan:  In summary, Casey Pope is a very pleasant 38 y.o.-year old female with an underlying medical history of allergies, asthma, anxiety, depression, history of colitis, plantar fasciitis, and severe recently with a BMI of over 40, whose history and physical exam are concerning for sleep disordered breathing, particularly obstructive sleep apnea (OSA) .  While a laboratory attended sleep study is typically considered "gold standard" for evaluation of sleep  disordered breathing, we mutually agreed to proceed with a home sleep test at this time, especially, very loud device as she does live quite a bit of a distance away from  here.   I had a long chat with the patient about my findings and the diagnosis of sleep apnea, particularly OSA, its prognosis and treatment options. We talked about medical/conservative treatments, surgical interventions and non-pharmacological approaches for symptom control. I explained, in particular, the risks and ramifications of untreated moderate to severe OSA, especially with respect to developing cardiovascular disease down the road, including congestive heart failure (CHF), difficult to treat hypertension, cardiac arrhythmias (particularly A-fib), neurovascular complications including TIA, stroke and dementia. Even type 2 diabetes has, in part, been linked to untreated OSA. Symptoms of untreated OSA may include (but may not be limited to) daytime sleepiness, nocturia (i.e. frequent nighttime urination), memory problems, mood irritability and suboptimally controlled or worsening mood disorder such as depression and/or anxiety, lack of energy, lack of motivation, physical discomfort, as well as recurrent headaches, especially morning or nocturnal headaches. We talked about the importance of maintaining a healthy lifestyle and striving for healthy weight. In addition, we talked about the importance of striving for and maintaining good sleep hygiene. I recommended a sleep study at this time. I outlined the differences between a laboratory attended sleep study which is considered more comprehensive and accurate over the option of a home sleep test (HST); the latter may lead to underestimation of sleep disordered breathing in some instances and does not help with diagnosing upper airway resistance syndrome and is not accurate enough to diagnose primary central sleep apnea typically. I outlined possible surgical and non-surgical treatment  options of OSA, including the use of a positive airway pressure (PAP) device (i.e. CPAP, AutoPAP/APAP or BiPAP in certain circumstances), a custom-made dental device (aka oral appliance, which would require a referral to a specialist dentist or orthodontist typically, and is generally speaking not considered for patients with full dentures or edentulous state), upper airway surgical options, such as traditional UPPP (which is not considered a first-line treatment) or the Inspire device (hypoglossal nerve stimulator, which would involve a referral for consultation with an ENT surgeon, after careful selection, following inclusion criteria - also not first-line treatment). I explained the PAP treatment option to the patient in detail, as this is generally considered first-line treatment.  The patient indicated that she would be willing to try PAP therapy, if the need arises. I explained the importance of being compliant with PAP treatment, not only for insurance purposes but primarily to improve patient's symptoms symptoms, and for the patient's long term health benefit, including to reduce Her cardiovascular risks longer-term.    We will pick up our discussion about the next steps and treatment options after testing.  We will keep her posted as to the test results by phone call and/or MyChart messaging where possible.  We will plan to follow-up in sleep clinic accordingly as well.  I answered all her questions today and the patient was in agreement.   I encouraged her to call with any interim questions, concerns, problems or updates or email [place] through MyChart.  Generally speaking, sleep test authorizations may take up to 2 weeks, sometimes less, sometimes longer, the patient is encouraged to get in touch with [place] if they do not hear back from the sleep lab staff directly within the next 2 weeks.  Thank you very much for allowing me to participate in the care of this nice patient. If I can be of any further  assistance to you please do not hesitate to call me at [phone].  Sincerely,   Huston Foley, MD, PhD

## 2023-05-25 NOTE — Patient Instructions (Signed)

## 2023-05-28 ENCOUNTER — Ambulatory Visit: Payer: 59 | Admitting: Neurology

## 2023-05-28 DIAGNOSIS — R519 Headache, unspecified: Secondary | ICD-10-CM

## 2023-05-28 DIAGNOSIS — Z9189 Other specified personal risk factors, not elsewhere classified: Secondary | ICD-10-CM

## 2023-05-28 DIAGNOSIS — R0683 Snoring: Secondary | ICD-10-CM

## 2023-05-28 DIAGNOSIS — G4719 Other hypersomnia: Secondary | ICD-10-CM

## 2023-05-28 DIAGNOSIS — G4733 Obstructive sleep apnea (adult) (pediatric): Secondary | ICD-10-CM | POA: Diagnosis not present

## 2023-05-28 DIAGNOSIS — G4763 Sleep related bruxism: Secondary | ICD-10-CM

## 2023-05-28 DIAGNOSIS — R0681 Apnea, not elsewhere classified: Secondary | ICD-10-CM

## 2023-06-09 ENCOUNTER — Encounter: Payer: Self-pay | Admitting: Neurology

## 2023-06-09 NOTE — Progress Notes (Signed)
 See procedure note.

## 2023-06-09 NOTE — Procedures (Signed)
 Casey Pope  HOME SLEEP TEST (SANSA) REPORT (Mail-Out Device):   STUDY DATE: 05/30/23  DOB: 08-07-1985  MRN: [id number]  ORDERING CLINICIAN: Huston Foley, MD, PhD   REFERRING CLINICIAN: Larae Grooms, NP   CLINICAL INFORMATION/HISTORY: 38 year old female with an underlying medical history of allergies, asthma, anxiety, depression, history of colitis, plantar fasciitis, and severe recently with a BMI of over 40, who reports snoring and excessive daytime somnolence for years, she is noticed worsening over time and has had weight gain over time. She has had witnessed apneas per husband's feedback.   PATIENT'S LAST REPORTED EPWORTH SLEEPINESS SCORE (ESS): 16/24.  BMI (at the time of sleep clinic visit and/or test date): 40.2 kg/m  FINDINGS:   Study Protocol:    The SANSA single-point-of-skin-contact chest-worn sensor - an FDA cleared and DOT approved type 4 home sleep test device - measures eight physiological channels,  including blood oxygen saturation (measured via PPG [photoplethysmography]), EKG-derived heart rate, respiratory effort, chest movement (measured via accelerometer), snoring, body position, and actigraphy. The device is designed to be worn for up to 10 hours per study.   Sleep Summary:   Total Recording Time (hours, min): 8 hours, 28 min  Total Effective Sleep Time (hours, min):  7 hours, 21 min  Sleep Efficiency (%):    87%   Respiratory Indices:   Calculated sAHI (per hour):  1.9/hour         Oxygen Saturation Statistics:    Oxygen Saturation (%) Mean: 95.2%   Minimum oxygen saturation (%):                 87.7%   O2 Saturation Range (%): 87.7 - 99.6%   Time below or at 88% saturation: 0 min   Pulse Rate Statistics:   Pulse Mean (bpm):    82/min    Pulse Range (65 - 123/min)   Snoring: intermittent, mild to moderate   IMPRESSION/DIAGNOSES:     Primary snoring  RECOMMENDATIONS:   This home sleep test does not  demonstrate any significant obstructive sleep disordered breathing with a total AHI of less than 5/hour. Her total AHI was 1.9/hour, O2 nadir of 87.7%.  Snoring was detected, intermittently, in the mild to moderate range. Treatment with a positive airway pressure device such as AutoPap or CPAP is not indicated based on this test. Snoring may improve with avoidance of the supine sleep position and weight loss (where clinically appropriate). For disturbing snoring, an oral appliance through dentistry or orthodontics can be considered.  Other causes of the patient's symptoms, including circadian rhythm disturbances, an underlying mood disorder, medication effect and/or an underlying medical problem cannot be ruled out based on this test. Clinical correlation is recommended.  The patient should be cautioned not to drive, work at heights, or operate dangerous or heavy equipment when tired or sleepy. Review and reiteration of good sleep hygiene measures should be pursued with any patient. The patient will be advised to follow up with her referring provider, who will be notified of the test results.   I certify that I have reviewed the raw data recording prior to the issuance of this report in accordance with the standards of the American Academy of Sleep Medicine (AASM).    INTERPRETING PHYSICIAN:   Huston Foley, MD, PhD Medical Director, [facility]) Diplomat, ABPN (Neurology and Sleep)   [facility] [address] [place], [place] [address] [phone]

## 2023-06-10 NOTE — Telephone Encounter (Signed)
 Dr Frances Furbish sent result to patient via mychart.

## 2023-06-21 ENCOUNTER — Encounter: Payer: Self-pay | Admitting: Nurse Practitioner

## 2023-06-29 ENCOUNTER — Ambulatory Visit: Payer: Self-pay | Admitting: Nurse Practitioner

## 2023-06-29 ENCOUNTER — Encounter: Payer: Self-pay | Admitting: Nurse Practitioner

## 2023-06-29 NOTE — Assessment & Plan Note (Signed)
 Chronic.  On Semaglutide 0.5mg  weekly.  Has lost about 8lbs since starting the medication.  Continue with current dose.  Refills have already been sent into the pharmacy.  Also recommend increasing protein intake.  Follow up in 3 months.  Call sooner if concerns arise.

## 2023-06-29 NOTE — Progress Notes (Signed)
 BP 97/66 (BP Location: Left Arm, Patient Position: Sitting, Cuff Size: Large)   Pulse 86   Temp 98.5 F (36.9 C) (Oral)   Resp 16   Ht 5' 4.02" (1.626 m)   Wt 226 lb 6.4 oz (102.7 kg)   LMP  (LMP Unknown)   SpO2 98%   BMI 38.84 kg/m    Subjective:    Patient ID: Casey Pope, female    DOB: 11-21-1985, 38 y.o.   MRN: [id number]  HPI: Krisy Dix is a 38 y.o. female  Chief Complaint  Patient presents with   Obesity    Concerned about the compunding   WEIGHT GAIN Duration: Doing well With compounded Semglutide.  Previous attempts at weight loss: yes Complications of obesity: depression Current weight of 226lb. Tolerating the medication well.  No concerns at visit today.        Relevant past medical, surgical, family and social history reviewed and updated as indicated. Interim medical history since our last visit reviewed. Allergies and medications reviewed and updated.  Review of Systems  Constitutional:  Negative for unexpected weight change.    Per HPI unless specifically indicated above     Objective:    BP 97/66 (BP Location: Left Arm, Patient Position: Sitting, Cuff Size: Large)   Pulse 86   Temp 98.5 F (36.9 C) (Oral)   Resp 16   Ht 5' 4.02" (1.626 m)   Wt 226 lb 6.4 oz (102.7 kg)   LMP  (LMP Unknown)   SpO2 98%   BMI 38.84 kg/m   Wt Readings from Last 3 Encounters:  06/29/23 226 lb 6.4 oz (102.7 kg)  05/25/23 234 lb (106.1 kg)  04/28/23 237 lb 6.4 oz (107.7 kg)    Physical Exam Vitals and nursing note reviewed.  Constitutional:      General: She is not in acute distress.    Appearance: Normal appearance. She is obese. She is not ill-appearing, toxic-appearing or diaphoretic.  HENT:     Head: Normocephalic.     Right Ear: External ear normal.     Left Ear: External ear normal.     Nose: Nose normal.     Mouth/Throat:     Mouth: Mucous membranes are moist.     Pharynx: Oropharynx is clear.  Eyes:      General:        Right eye: No discharge.        Left eye: No discharge.     Extraocular Movements: Extraocular movements intact.     Conjunctiva/sclera: Conjunctivae normal.     Pupils: Pupils are equal, round, and reactive to light.  Cardiovascular:     Rate and Rhythm: Normal rate and regular rhythm.     Heart sounds: No murmur heard. Pulmonary:     Effort: Pulmonary effort is normal. No respiratory distress.     Breath sounds: Normal breath sounds. No wheezing or rales.  Musculoskeletal:     Cervical back: Normal range of motion and neck supple.  Skin:    General: Skin is warm and dry.     Capillary Refill: Capillary refill takes less than 2 seconds.  Neurological:     General: No focal deficit present.     Mental Status: She is alert and oriented to person, place, and time. Mental status is at baseline.  Psychiatric:        Mood and Affect: Mood normal.        Behavior: Behavior normal.  Thought Content: Thought content normal.        Judgment: Judgment normal.     Results for orders placed or performed in visit on 01/12/23  CBC with Differential/Platelet   Collection Time: 01/12/23  3:12 PM  Result Value Ref Range   WBC 6.7 3.4 - 10.8 x10E3/uL   RBC 4.48 3.77 - 5.28 x10E6/uL   Hemoglobin 13.4 11.1 - 15.9 g/dL   Hematocrit 16.1 09.6 - 46.6 %   MCV 93 79 - 97 fL   MCH 29.9 26.6 - 33.0 pg   MCHC 32.1 31.5 - 35.7 g/dL   RDW 04.5 40.9 - 81.1 %   Platelets 299 150 - 450 x10E3/uL   Neutrophils 54 Not Estab. %   Lymphs 38 Not Estab. %   Monocytes 5 Not Estab. %   Eos 2 Not Estab. %   Basos 1 Not Estab. %   Neutrophils Absolute 3.6 1.4 - 7.0 x10E3/uL   Lymphocytes Absolute 2.5 0.7 - 3.1 x10E3/uL   Monocytes Absolute 0.3 0.1 - 0.9 x10E3/uL   EOS (ABSOLUTE) 0.1 0.0 - 0.4 x10E3/uL   Basophils Absolute 0.1 0.0 - 0.2 x10E3/uL   Immature Granulocytes 0 Not Estab. %   Immature Grans (Abs) 0.0 0.0 - 0.1 x10E3/uL  Comprehensive Metabolic Panel (CMET)   Collection Time:  01/12/23  3:12 PM  Result Value Ref Range   Glucose 90 70 - 99 mg/dL   BUN 9 6 - 20 mg/dL   Creatinine, Ser 9.14 0.57 - 1.00 mg/dL   eGFR 782 >95 AO/ZHY/8.65   BUN/Creatinine Ratio 12 9 - 23   Sodium 143 134 - 144 mmol/L   Potassium 4.3 3.5 - 5.2 mmol/L   Chloride 106 96 - 106 mmol/L   CO2 23 20 - 29 mmol/L   Calcium 9.0 8.7 - 10.2 mg/dL   Total Protein 6.9 6.0 - 8.5 g/dL   Albumin 4.0 3.9 - 4.9 g/dL   Globulin, Total 2.9 1.5 - 4.5 g/dL   Bilirubin Total 0.3 0.0 - 1.2 mg/dL   Alkaline Phosphatase 85 44 - 121 IU/L   AST 9 0 - 40 IU/L   ALT 8 0 - 32 IU/L  TSH   Collection Time: 01/12/23  3:12 PM  Result Value Ref Range   TSH 1.400 0.450 - 4.500 uIU/mL  Hemoglobin A1c   Collection Time: 01/12/23  3:12 PM  Result Value Ref Range   Hgb A1c MFr Bld 5.1 4.8 - 5.6 %   Est. average glucose Bld gHb Est-mCnc 100 mg/dL  Vitamin D (25 hydroxy)   Collection Time: 01/12/23  3:12 PM  Result Value Ref Range   Vit D, 25-Hydroxy 29.4 (L) 30.0 - 100.0 ng/mL  Lipid panel   Collection Time: 01/12/23  3:12 PM  Result Value Ref Range   Cholesterol, Total 130 100 - 199 mg/dL   Triglycerides 784 0 - 149 mg/dL   HDL 56 >69 mg/dL   VLDL Cholesterol Cal 19 5 - 40 mg/dL   LDL Chol Calc (NIH) 55 0 - 99 mg/dL   Chol/HDL Ratio 2.3 0.0 - 4.4 ratio      Assessment & Plan:   Problem List Items Addressed This Visit       Other   Morbid obesity (HCC) - Primary   Chronic.  On Semaglutide 0.5mg  weekly.  Has lost about 8lbs since starting the medication.  Continue with current dose.  Refills have already been sent into the pharmacy.  Also recommend increasing protein intake.  Follow up in 3 months.  Call sooner if concerns arise.         Follow up plan: Return in about 3 months (around 09/28/2023) for Weight Managment.

## 2023-07-02 ENCOUNTER — Encounter: Payer: Self-pay | Admitting: Nurse Practitioner

## 2023-07-02 ENCOUNTER — Telehealth: Admitting: Physician Assistant

## 2023-07-02 DIAGNOSIS — J019 Acute sinusitis, unspecified: Secondary | ICD-10-CM | POA: Diagnosis not present

## 2023-07-02 DIAGNOSIS — B9689 Other specified bacterial agents as the cause of diseases classified elsewhere: Secondary | ICD-10-CM | POA: Diagnosis not present

## 2023-07-02 MED ORDER — DOXYCYCLINE HYCLATE 100 MG PO TABS
100.0000 mg | ORAL_TABLET | Freq: Two times a day (BID) | ORAL | 0 refills | Status: DC
Start: 2023-07-02 — End: 2023-10-07

## 2023-07-02 NOTE — Progress Notes (Signed)
 Virtual Visit Consent   Casey Pope, you are scheduled for a virtual visit with a [facility] provider today. Just as with appointments in the office, your consent must be obtained to participate. Your consent will be active for this visit and any virtual visit you may have with one of our providers in the next 365 days. If you have a MyChart account, a copy of this consent can be sent to you electronically.  As this is a virtual visit, video technology does not allow for your provider to perform a traditional examination. This may limit your provider's ability to fully assess your condition. If your provider identifies any concerns that need to be evaluated in person or the need to arrange testing (such as labs, EKG, etc.), we will make arrangements to do so. Although advances in technology are sophisticated, we cannot ensure that it will always work on either your end or our end. If the connection with a video visit is poor, the visit may have to be switched to a telephone visit. With either a video or telephone visit, we are not always able to ensure that we have a secure connection.  By engaging in this virtual visit, you consent to the provision of healthcare and authorize for your insurance to be billed (if applicable) for the services provided during this visit. Depending on your insurance coverage, you may receive a charge related to this service.  I need to obtain your verbal consent now. Are you willing to proceed with your visit today? Maranda Marte Kacie Huxtable has provided verbal consent on 07/02/2023 for a virtual visit (video or telephone). Margaretann Loveless, PA-C  Date: 07/02/2023 6:57 PM   Virtual Visit via Video Note   I, Margaretann Loveless, connected with  Casey Pope  ([id number], 25-Dec-1985) on 07/02/23 at  6:45 PM EDT by a video-enabled telemedicine application and verified that I am speaking with the correct person using two  identifiers.  Location: Patient: Virtual Visit Location Patient: Mobile Provider: Virtual Visit Location Provider: Home Office   I discussed the limitations of evaluation and management by telemedicine and the availability of in person appointments. The patient expressed understanding and agreed to proceed.    History of Present Illness: Casey Pope is a 38 y.o. who identifies as a female who was assigned female at birth, and is being seen today for sinus congestion.  HPI: Sinusitis This is a new problem. The current episode started in the past 7 days. The problem has been gradually worsening since onset. There has been no fever. The pain is mild. Associated symptoms include congestion, coughing (dry, mild), headaches, a hoarse voice, sinus pressure, sneezing and a sore throat (yesterday; improving). Pertinent negatives include no chills, ear pain, shortness of breath or swollen glands. Treatments tried: allegra, flonase, saline nasal rinse, nyquil, dayquil. The treatment provided no relief.     Problems:  Patient Active Problem List   Diagnosis Date Noted   Loud snoring 03/27/2023   Teeth grinding 03/27/2023   Witnessed episode of apnea 03/27/2023   Morbid obesity (HCC) 09/20/2022   Mood disorder (HCC) 09/20/2022    Allergies:  Allergies  Allergen Reactions   Ciprofloxacin    Sulfa Antibiotics Hives   Clindamycin/Lincomycin Other (See Comments)    C-Diff   Amoxicillin Rash   Nickel Rash   Medications:  Current Outpatient Medications:    doxycycline (VIBRA-TABS) 100 MG tablet, Take 1 tablet (100 mg total) by mouth 2 (  two) times daily., Disp: 20 tablet, Rfl: 0   buPROPion (WELLBUTRIN SR) 200 MG 12 hr tablet, Take 1 tablet (200 mg total) by mouth 2 (two) times daily., Disp: 180 tablet, Rfl: 3   cholecalciferol (VITAMIN D3) 25 MCG (1000 UNIT) tablet, Take 3,000 Units by mouth daily., Disp: , Rfl:    Levonorgestrel-Ethinyl Estradiol (SIMPESSE) 0.15-0.03 &0.01 MG  tablet, Take 1 tablet by mouth daily., Disp: 91 tablet, Rfl: 4   ondansetron (ZOFRAN-ODT) 4 MG disintegrating tablet, Take 1 tablet (4 mg total) by mouth every 8 (eight) hours as needed., Disp: 20 tablet, Rfl: 0   Semaglutide-Weight Management 1 MG/0.5ML SOAJ, Inject 1 mg into the skin once a week., Disp: 6 mL, Rfl: 0   traZODone (DESYREL) 50 MG tablet, Take 25 mg by mouth at bedtime as needed., Disp: , Rfl:   Observations/Objective: Patient is well-developed, well-nourished in no acute distress.  Resting comfortably  Head is normocephalic, atraumatic.  No labored breathing.  Speech is clear and coherent with logical content.  Patient is alert and oriented at baseline.    Assessment and Plan: 1. Acute bacterial sinusitis (Primary) - doxycycline (VIBRA-TABS) 100 MG tablet; Take 1 tablet (100 mg total) by mouth 2 (two) times daily.  Dispense: 20 tablet; Refill: 0  - Worsening symptoms that have not responded to OTC medications.  - Will give Doxycycline - Continue allergy medications.  - Steam and humidifier can help - Stay well hydrated and get plenty of rest.  - Seek in person evaluation if no symptom improvement or if symptoms worsen   Follow Up Instructions: I discussed the assessment and treatment plan with the patient. The patient was provided an opportunity to ask questions and all were answered. The patient agreed with the plan and demonstrated an understanding of the instructions.  A copy of instructions were sent to the patient via MyChart unless otherwise noted below.    The patient was advised to call back or seek an in-person evaluation if the symptoms worsen or if the condition fails to improve as anticipated.    Margaretann Loveless, PA-C

## 2023-07-02 NOTE — Patient Instructions (Signed)
 Jacinto Halim Earnest Conroy, thank you for joining Margaretann Loveless, PA-C for today's virtual visit.  While this provider is not your primary care provider (PCP), if your PCP is located in our provider database this encounter information will be shared with them immediately following your visit.   A [facility] MyChart account gives you access to today's visit and all your visits, tests, and labs performed at [facility] " click here if you don't have a [facility] MyChart account or go to [email or web address]  Consent: (Patient) Mackinze Criado provided verbal consent for this virtual visit at the beginning of the encounter.  Current Medications:  Current Outpatient Medications:    doxycycline (VIBRA-TABS) 100 MG tablet, Take 1 tablet (100 mg total) by mouth 2 (two) times daily., Disp: 20 tablet, Rfl: 0   buPROPion (WELLBUTRIN SR) 200 MG 12 hr tablet, Take 1 tablet (200 mg total) by mouth 2 (two) times daily., Disp: 180 tablet, Rfl: 3   cholecalciferol (VITAMIN D3) 25 MCG (1000 UNIT) tablet, Take 3,000 Units by mouth daily., Disp: , Rfl:    Levonorgestrel-Ethinyl Estradiol (SIMPESSE) 0.15-0.03 &0.01 MG tablet, Take 1 tablet by mouth daily., Disp: 91 tablet, Rfl: 4   ondansetron (ZOFRAN-ODT) 4 MG disintegrating tablet, Take 1 tablet (4 mg total) by mouth every 8 (eight) hours as needed., Disp: 20 tablet, Rfl: 0   Semaglutide-Weight Management 1 MG/0.5ML SOAJ, Inject 1 mg into the skin once a week., Disp: 6 mL, Rfl: 0   traZODone (DESYREL) 50 MG tablet, Take 25 mg by mouth at bedtime as needed., Disp: , Rfl:    Medications ordered in this encounter:  Meds ordered this encounter  Medications   doxycycline (VIBRA-TABS) 100 MG tablet    Sig: Take 1 tablet (100 mg total) by mouth 2 (two) times daily.    Dispense:  20 tablet    Refill:  0    Supervising Provider:   Merrilee Jansky [[id number]]     *If you need refills on other medications prior to your next  appointment, please contact your pharmacy*  Follow-Up: Call back or seek an in-person evaluation if the symptoms worsen or if the condition fails to improve as anticipated.  [facility] [phone]  Other Instructions Sinus Infection, Adult A sinus infection, also called sinusitis, is inflammation of your sinuses. Sinuses are hollow spaces in the bones around your face. Your sinuses are located: Around your eyes. In the middle of your forehead. Behind your nose. In your cheekbones. Mucus normally drains out of your sinuses. When your nasal tissues become inflamed or swollen, mucus can become trapped or blocked. This allows bacteria, viruses, and fungi to grow, which leads to infection. Most infections of the sinuses are caused by a virus. A sinus infection can develop quickly. It can last for up to 4 weeks (acute) or for more than 12 weeks (chronic). A sinus infection often develops after a cold. What are the causes? This condition is caused by anything that creates swelling in the sinuses or stops mucus from draining. This includes: Allergies. Asthma. Infection from bacteria or viruses. Deformities or blockages in your nose or sinuses. Abnormal growths in the nose (nasal polyps). Pollutants, such as chemicals or irritants in the air. Infection from fungi. This is rare. What increases the risk? You are more likely to develop this condition if you: Have a weak body defense system (immune system). Do a lot of swimming or diving. Overuse nasal sprays. Smoke. What are  the signs or symptoms? The main symptoms of this condition are pain and a feeling of pressure around the affected sinuses. Other symptoms include: Stuffy nose or congestion that makes it difficult to breathe through your nose. Thick yellow or greenish drainage from your nose. Tenderness, swelling, and warmth over the affected sinuses. A cough that may get worse at night. Decreased sense of smell and  taste. Extra mucus that collects in the throat or the back of the nose (postnasal drip) causing a sore throat or bad breath. Tiredness (fatigue). Fever. How is this diagnosed? This condition is diagnosed based on: Your symptoms. Your medical history. A physical exam. Tests to find out if your condition is acute or chronic. This may include: Checking your nose for nasal polyps. Viewing your sinuses using a device that has a light (endoscope). Testing for allergies or bacteria. Imaging tests, such as an MRI or CT scan. In rare cases, a bone biopsy may be done to rule out more serious types of fungal sinus disease. How is this treated? Treatment for a sinus infection depends on the cause and whether your condition is chronic or acute. If caused by a virus, your symptoms should go away on their own within 10 days. You may be given medicines to relieve symptoms. They include: Medicines that shrink swollen nasal passages (decongestants). A spray that eases inflammation of the nostrils (topical intranasal corticosteroids). Rinses that help get rid of thick mucus in your nose (nasal saline washes). Medicines that treat allergies (antihistamines). Over-the-counter pain relievers. If caused by bacteria, your health care provider may recommend waiting to see if your symptoms improve. Most bacterial infections will get better without antibiotic medicine. You may be given antibiotics if you have: A severe infection. A weak immune system. If caused by narrow nasal passages or nasal polyps, surgery may be needed. Follow these instructions at home: Medicines Take, use, or apply over-the-counter and prescription medicines only as told by your health care provider. These may include nasal sprays. If you were prescribed an antibiotic medicine, take it as told by your health care provider. Do not stop taking the antibiotic even if you start to feel better. Hydrate and humidify  Drink enough fluid to  keep your urine pale yellow. Staying hydrated will help to thin your mucus. Use a cool mist humidifier to keep the humidity level in your home above 50%. Inhale steam for 10-15 minutes, 3-4 times a day, or as told by your health care provider. You can do this in the bathroom while a hot shower is running. Limit your exposure to cool or dry air. Rest Rest as much as possible. Sleep with your head raised (elevated). Make sure you get enough sleep each night. General instructions  Apply a warm, moist washcloth to your face 3-4 times a day or as told by your health care provider. This will help with discomfort. Use nasal saline washes as often as told by your health care provider. Wash your hands often with soap and water to reduce your exposure to germs. If soap and water are not available, use hand sanitizer. Do not smoke. Avoid being around people who are smoking (secondhand smoke). Keep all follow-up visits. This is important. Contact a health care provider if: You have a fever. Your symptoms get worse. Your symptoms do not improve within 10 days. Get help right away if: You have a severe headache. You have persistent vomiting. You have severe pain or swelling around your face or eyes. You have  vision problems. You develop confusion. Your neck is stiff. You have trouble breathing. These symptoms may be an emergency. Get help right away. Call 911. Do not wait to see if the symptoms will go away. Do not drive yourself to the hospital. Summary A sinus infection is soreness and inflammation of your sinuses. Sinuses are hollow spaces in the bones around your face. This condition is caused by nasal tissues that become inflamed or swollen. The swelling traps or blocks the flow of mucus. This allows bacteria, viruses, and fungi to grow, which leads to infection. If you were prescribed an antibiotic medicine, take it as told by your health care provider. Do not stop taking the antibiotic even  if you start to feel better. Keep all follow-up visits. This is important. This information is not intended to replace advice given to you by your health care provider. Make sure you discuss any questions you have with your health care provider. Document Revised: 02/18/2021 Document Reviewed: 02/18/2021 Elsevier Patient Education  2024 [employer]   If you have been instructed to have an in-person evaluation today at a local Urgent Care facility, please use the link below. It will take you to a list of all of our available [facility], including address, phone number and hours of operation. Please do not delay care.  [facility]  If you or a family member do not have a primary care provider, use the link below to schedule a visit and establish care. When you choose a [facility] primary care physician or advanced practice provider, you gain a long-term partner in health. Find a Primary Care Provider  Learn more about [facility]'s in-office and virtual care options: [facility] - Get Care Now

## 2023-07-08 ENCOUNTER — Ambulatory Visit: Payer: 59 | Admitting: Nurse Practitioner

## 2023-07-29 ENCOUNTER — Encounter: Payer: Self-pay | Admitting: Nurse Practitioner

## 2023-09-28 ENCOUNTER — Ambulatory Visit: Admitting: Nurse Practitioner

## 2023-09-28 ENCOUNTER — Encounter: Payer: Self-pay | Admitting: Nurse Practitioner

## 2023-09-28 NOTE — Assessment & Plan Note (Signed)
 Chronic.  Improved.  Has lost 12lbs in the last 3 months.  Continue with Semaglutide .  Recommend increasing to the 1.7mg  dose.  Follow up in 4 months.  Call sooner if concerns arise.

## 2023-09-28 NOTE — Progress Notes (Signed)
 BP 106/67   Pulse 84   Ht 5' 4 (1.626 m)   Wt 222 lb (100.7 kg)   BMI 38.11 kg/m    Subjective:    Patient ID: Casey Pope, female    DOB: 05-16-1985, 38 y.o.   MRN: [id number]  HPI: Casey Pope is a 38 y.o. female  Chief Complaint  Patient presents with   Follow-up    Weight management   WEIGHT GAIN Duration: Doing well With compounded Semglutide. She has lost about 12lbs since February.  She is doing the 1mg  dose.  Considering increasing the dose to 1.7mg  dose per week. Previous attempts at weight loss: yes Complications of obesity: depression Current weight of 222lb. Tolerating the medication well.  No concerns at visit today.        Relevant past medical, surgical, family and social history reviewed and updated as indicated. Interim medical history since our last visit reviewed. Allergies and medications reviewed and updated.  Review of Systems  Constitutional:  Negative for unexpected weight change.    Per HPI unless specifically indicated above     Objective:    BP 106/67   Pulse 84   Ht 5' 4 (1.626 m)   Wt 222 lb (100.7 kg)   BMI 38.11 kg/m   Wt Readings from Last 3 Encounters:  09/28/23 222 lb (100.7 kg)  06/29/23 226 lb 6.4 oz (102.7 kg)  05/25/23 234 lb (106.1 kg)    Physical Exam Vitals and nursing note reviewed.  Constitutional:      General: She is not in acute distress.    Appearance: Normal appearance. She is obese. She is not ill-appearing, toxic-appearing or diaphoretic.  HENT:     Head: Normocephalic.     Right Ear: External ear normal.     Left Ear: External ear normal.     Nose: Nose normal.     Mouth/Throat:     Mouth: Mucous membranes are moist.     Pharynx: Oropharynx is clear.   Eyes:     General:        Right eye: No discharge.        Left eye: No discharge.     Extraocular Movements: Extraocular movements intact.     Conjunctiva/sclera: Conjunctivae normal.     Pupils: Pupils are  equal, round, and reactive to light.    Cardiovascular:     Rate and Rhythm: Normal rate and regular rhythm.     Heart sounds: No murmur heard. Pulmonary:     Effort: Pulmonary effort is normal. No respiratory distress.     Breath sounds: Normal breath sounds. No wheezing or rales.   Musculoskeletal:     Cervical back: Normal range of motion and neck supple.   Skin:    General: Skin is warm and dry.     Capillary Refill: Capillary refill takes less than 2 seconds.   Neurological:     General: No focal deficit present.     Mental Status: She is alert and oriented to person, place, and time. Mental status is at baseline.   Psychiatric:        Mood and Affect: Mood normal.        Behavior: Behavior normal.        Thought Content: Thought content normal.        Judgment: Judgment normal.     Results for orders placed or performed in visit on 01/12/23  CBC with Differential/Platelet   Collection Time:  01/12/23  3:12 PM  Result Value Ref Range   WBC 6.7 3.4 - 10.8 x10E3/uL   RBC 4.48 3.77 - 5.28 x10E6/uL   Hemoglobin 13.4 11.1 - 15.9 g/dL   Hematocrit 58.2 65.9 - 46.6 %   MCV 93 79 - 97 fL   MCH 29.9 26.6 - 33.0 pg   MCHC 32.1 31.5 - 35.7 g/dL   RDW 87.6 88.2 - 84.5 %   Platelets 299 150 - 450 x10E3/uL   Neutrophils 54 Not Estab. %   Lymphs 38 Not Estab. %   Monocytes 5 Not Estab. %   Eos 2 Not Estab. %   Basos 1 Not Estab. %   Neutrophils Absolute 3.6 1.4 - 7.0 x10E3/uL   Lymphocytes Absolute 2.5 0.7 - 3.1 x10E3/uL   Monocytes Absolute 0.3 0.1 - 0.9 x10E3/uL   EOS (ABSOLUTE) 0.1 0.0 - 0.4 x10E3/uL   Basophils Absolute 0.1 0.0 - 0.2 x10E3/uL   Immature Granulocytes 0 Not Estab. %   Immature Grans (Abs) 0.0 0.0 - 0.1 x10E3/uL  Comprehensive Metabolic Panel (CMET)   Collection Time: 01/12/23  3:12 PM  Result Value Ref Range   Glucose 90 70 - 99 mg/dL   BUN 9 6 - 20 mg/dL   Creatinine, Ser 9.25 0.57 - 1.00 mg/dL   eGFR 892 >40 fO/fpw/8.26   BUN/Creatinine Ratio 12  9 - 23   Sodium 143 134 - 144 mmol/L   Potassium 4.3 3.5 - 5.2 mmol/L   Chloride 106 96 - 106 mmol/L   CO2 23 20 - 29 mmol/L   Calcium 9.0 8.7 - 10.2 mg/dL   Total Protein 6.9 6.0 - 8.5 g/dL   Albumin 4.0 3.9 - 4.9 g/dL   Globulin, Total 2.9 1.5 - 4.5 g/dL   Bilirubin Total 0.3 0.0 - 1.2 mg/dL   Alkaline Phosphatase 85 44 - 121 IU/L   AST 9 0 - 40 IU/L   ALT 8 0 - 32 IU/L  TSH   Collection Time: 01/12/23  3:12 PM  Result Value Ref Range   TSH 1.400 0.450 - 4.500 uIU/mL  Hemoglobin A1c   Collection Time: 01/12/23  3:12 PM  Result Value Ref Range   Hgb A1c MFr Bld 5.1 4.8 - 5.6 %   Est. average glucose Bld gHb Est-mCnc 100 mg/dL  Vitamin D  (25 hydroxy)   Collection Time: 01/12/23  3:12 PM  Result Value Ref Range   Vit D, 25-Hydroxy 29.4 (L) 30.0 - 100.0 ng/mL  Lipid panel   Collection Time: 01/12/23  3:12 PM  Result Value Ref Range   Cholesterol, Total 130 100 - 199 mg/dL   Triglycerides 894 0 - 149 mg/dL   HDL 56 >60 mg/dL   VLDL Cholesterol Cal 19 5 - 40 mg/dL   LDL Chol Calc (NIH) 55 0 - 99 mg/dL   Chol/HDL Ratio 2.3 0.0 - 4.4 ratio      Assessment & Plan:   Problem List Items Addressed This Visit       Other   Morbid obesity (HCC) - Primary   Chronic.  Improved.  Has lost 12lbs in the last 3 months.  Continue with Semaglutide .  Recommend increasing to the 1.7mg  dose.  Follow up in 4 months.  Call sooner if concerns arise.        Follow up plan: Return in about 4 months (around 01/29/2024) for Physical and Fasting labs.

## 2023-10-06 ENCOUNTER — Ambulatory Visit: Payer: Self-pay

## 2023-10-06 ENCOUNTER — Encounter: Payer: Self-pay | Admitting: Nurse Practitioner

## 2023-10-06 NOTE — Telephone Encounter (Signed)
  FYI Only or Action Required?: FYI only for provider.  Patient was last seen in primary care on 09/28/2023 by Melvin Pao, NP.  Called Nurse Triage reporting urinary symptoms.  Symptoms began several days ago.  Interventions attempted: OTC medications: azo.  Symptoms are: unchanged.  Triage Disposition: See Physician Within 24 Hours  Patient/caregiver understands and will follow disposition?: Yes  Urinary frequency x 3 days, Pelvic abdominal pressure, feels tired Denies fever, blood in urine, back or flank pain Patient is taking AZO and cranberry juice for symptom management, will take tylenol  or NSAID for abdominal pressure     Summary: urinary discomfort   The patient would like to be contacted by a member of clinical staff to discuss urinary concerns that they've experienced since Sunday 10/03/23. The patient shares that they have been taking Azo and drinking cranberry juice with little to no relief. Please contact when possible         Reason for Disposition  Urinating more frequently than usual (i.e., frequency) OR new-onset of the feeling of an urgent need to urinate (i.e., urgency)  Protocols used: Urinary Symptoms-A-AH

## 2023-10-07 ENCOUNTER — Ambulatory Visit: Admitting: Nurse Practitioner

## 2023-10-07 ENCOUNTER — Encounter: Payer: Self-pay | Admitting: Nurse Practitioner

## 2023-10-07 VITALS — BP 100/68 | HR 86 | Temp 97.5°F | Ht 64.0 in | Wt 223.2 lb

## 2023-10-07 DIAGNOSIS — N3000 Acute cystitis without hematuria: Secondary | ICD-10-CM

## 2023-10-07 DIAGNOSIS — R300 Dysuria: Secondary | ICD-10-CM

## 2023-10-07 LAB — URINALYSIS, ROUTINE W REFLEX MICROSCOPIC

## 2023-10-07 LAB — MICROSCOPIC EXAMINATION: Bacteria, UA: NONE SEEN

## 2023-10-07 MED ORDER — CEFTRIAXONE SODIUM 1 G IJ SOLR
1.0000 g | Freq: Once | INTRAMUSCULAR | Status: AC
  Administered 2023-10-07: 1 g via INTRAMUSCULAR

## 2023-10-07 NOTE — Progress Notes (Signed)
 BP 100/68   Pulse 86   Temp (!) 97.5 F (36.4 C) (Oral)   Ht 5' 4 (1.626 m)   Wt 223 lb 3.2 oz (101.2 kg)   SpO2 97%   BMI 38.31 kg/m    Subjective:    Patient ID: Casey Pope, female    DOB: [birth year], 38 y.o.   MRN: [id number]  HPI: Casey Pope is a 38 y.o. female  Chief Complaint  Patient presents with   Possible UTI    Onset Sunday. Frequent urinating, lbp, abd discomfort   URINARY SYMPTOMS Sunday night Dysuria: burning Urinary frequency: yes Urgency: yes Small volume voids: yes Symptom severity: yes Urinary incontinence: no Foul odor: yes Hematuria: no Abdominal pain: no Back pain: yes Suprapubic pain/pressure: yes Flank pain: yes Fever:  no Vomiting: no Relief with cranberry juice: no Relief with pyridium: yes Status: worse Previous urinary tract infection: no Recurrent urinary tract infection: no Sexual activity: No sexually active/monogomous/practicing safe sex History of sexually transmitted disease: no Penile discharge: no Treatments attempted: cranberry and increasing fluids   Relevant past medical, surgical, family and social history reviewed and updated as indicated. Interim medical history since our last visit reviewed. Allergies and medications reviewed and updated.  Review of Systems  Constitutional:  Negative for fever.  Gastrointestinal:  Negative for abdominal pain and vomiting.  Genitourinary:  Positive for decreased urine volume, dysuria, flank pain, frequency and urgency. Negative for hematuria.  Musculoskeletal:  Negative for back pain.    Per HPI unless specifically indicated above     Objective:    BP 100/68   Pulse 86   Temp (!) 97.5 F (36.4 C) (Oral)   Ht 5' 4 (1.626 m)   Wt 223 lb 3.2 oz (101.2 kg)   SpO2 97%   BMI 38.31 kg/m   Wt Readings from Last 3 Encounters:  10/07/23 223 lb 3.2 oz (101.2 kg)  09/28/23 222 lb (100.7 kg)  06/29/23 226 lb 6.4 oz (102.7 kg)    Physical  Exam Vitals and nursing note reviewed.  Constitutional:      General: She is not in acute distress.    Appearance: Normal appearance. She is normal weight. She is not ill-appearing, toxic-appearing or diaphoretic.  HENT:     Head: Normocephalic.     Right Ear: External ear normal.     Left Ear: External ear normal.     Nose: Nose normal.     Mouth/Throat:     Mouth: Mucous membranes are moist.     Pharynx: Oropharynx is clear.  Eyes:     General:        Right eye: No discharge.        Left eye: No discharge.     Extraocular Movements: Extraocular movements intact.     Conjunctiva/sclera: Conjunctivae normal.     Pupils: Pupils are equal, round, and reactive to light.  Cardiovascular:     Rate and Rhythm: Normal rate and regular rhythm.     Heart sounds: No murmur heard. Pulmonary:     Effort: Pulmonary effort is normal. No respiratory distress.     Breath sounds: Normal breath sounds. No wheezing or rales.  Abdominal:     General: Abdomen is flat. Bowel sounds are normal. There is no distension.     Palpations: Abdomen is soft.     Tenderness: There is abdominal tenderness. There is right CVA tenderness. There is no left CVA tenderness or guarding.  Musculoskeletal:  Cervical back: Normal range of motion and neck supple.  Skin:    General: Skin is warm and dry.     Capillary Refill: Capillary refill takes less than 2 seconds.  Neurological:     General: No focal deficit present.     Mental Status: She is alert and oriented to person, place, and time. Mental status is at baseline.  Psychiatric:        Mood and Affect: Mood normal.        Behavior: Behavior normal.        Thought Content: Thought content normal.        Judgment: Judgment normal.     Results for orders placed or performed in visit on 01/12/23  CBC with Differential/Platelet   Collection Time: 01/12/23  3:12 PM  Result Value Ref Range   WBC 6.7 3.4 - 10.8 x10E3/uL   RBC 4.48 3.77 - 5.28 x10E6/uL    Hemoglobin 13.4 11.1 - 15.9 g/dL   Hematocrit 58.2 65.9 - 46.6 %   MCV 93 79 - 97 fL   MCH 29.9 26.6 - 33.0 pg   MCHC 32.1 31.5 - 35.7 g/dL   RDW 87.6 88.2 - 84.5 %   Platelets 299 150 - 450 x10E3/uL   Neutrophils 54 Not Estab. %   Lymphs 38 Not Estab. %   Monocytes 5 Not Estab. %   Eos 2 Not Estab. %   Basos 1 Not Estab. %   Neutrophils Absolute 3.6 1.4 - 7.0 x10E3/uL   Lymphocytes Absolute 2.5 0.7 - 3.1 x10E3/uL   Monocytes Absolute 0.3 0.1 - 0.9 x10E3/uL   EOS (ABSOLUTE) 0.1 0.0 - 0.4 x10E3/uL   Basophils Absolute 0.1 0.0 - 0.2 x10E3/uL   Immature Granulocytes 0 Not Estab. %   Immature Grans (Abs) 0.0 0.0 - 0.1 x10E3/uL  Comprehensive Metabolic Panel (CMET)   Collection Time: 01/12/23  3:12 PM  Result Value Ref Range   Glucose 90 70 - 99 mg/dL   BUN 9 6 - 20 mg/dL   Creatinine, Ser 9.25 0.57 - 1.00 mg/dL   eGFR 892 >40 fO/fpw/8.26   BUN/Creatinine Ratio 12 9 - 23   Sodium 143 134 - 144 mmol/L   Potassium 4.3 3.5 - 5.2 mmol/L   Chloride 106 96 - 106 mmol/L   CO2 23 20 - 29 mmol/L   Calcium 9.0 8.7 - 10.2 mg/dL   Total Protein 6.9 6.0 - 8.5 g/dL   Albumin 4.0 3.9 - 4.9 g/dL   Globulin, Total 2.9 1.5 - 4.5 g/dL   Bilirubin Total 0.3 0.0 - 1.2 mg/dL   Alkaline Phosphatase 85 44 - 121 IU/L   AST 9 0 - 40 IU/L   ALT 8 0 - 32 IU/L  TSH   Collection Time: 01/12/23  3:12 PM  Result Value Ref Range   TSH 1.400 0.450 - 4.500 uIU/mL  Hemoglobin A1c   Collection Time: 01/12/23  3:12 PM  Result Value Ref Range   Hgb A1c MFr Bld 5.1 4.8 - 5.6 %   Est. average glucose Bld gHb Est-mCnc 100 mg/dL  Vitamin D  (25 hydroxy)   Collection Time: 01/12/23  3:12 PM  Result Value Ref Range   Vit D, 25-Hydroxy 29.4 (L) 30.0 - 100.0 ng/mL  Lipid panel   Collection Time: 01/12/23  3:12 PM  Result Value Ref Range   Cholesterol, Total 130 100 - 199 mg/dL   Triglycerides 894 0 - 149 mg/dL   HDL 56 >60 mg/dL  VLDL Cholesterol Cal 19 5 - 40 mg/dL   LDL Chol Calc (NIH) 55 0 - 99 mg/dL    Chol/HDL Ratio 2.3 0.0 - 4.4 ratio      Assessment & Plan:   Problem List Items Addressed This Visit   None Visit Diagnoses       Acute cystitis without hematuria    -  Primary   Will treat with Ceftriaxone  in office due to allergies and severity of symptoms.  Will send for culture. Patient has been taking AZO.   Relevant Medications   cefTRIAXone  (ROCEPHIN ) injection 1 g (Start on 10/07/2023  1:30 PM)   Other Relevant Orders   Urine Culture     Dysuria       Relevant Orders   Urinalysis, Routine w reflex microscopic        Follow up plan: No follow-ups on file.

## 2023-10-08 ENCOUNTER — Ambulatory Visit: Payer: Self-pay | Admitting: Nurse Practitioner

## 2023-10-11 ENCOUNTER — Encounter: Payer: Self-pay | Admitting: Nurse Practitioner

## 2023-10-11 LAB — URINE CULTURE

## 2024-01-13 ENCOUNTER — Encounter: Payer: BC Managed Care – PPO | Admitting: Family Medicine

## 2024-01-17 ENCOUNTER — Telehealth: Admitting: Family Medicine

## 2024-01-17 ENCOUNTER — Encounter

## 2024-01-17 DIAGNOSIS — J301 Allergic rhinitis due to pollen: Secondary | ICD-10-CM | POA: Diagnosis not present

## 2024-01-17 MED ORDER — PREDNISONE 20 MG PO TABS: 20.0000 mg | ORAL_TABLET | Freq: Two times a day (BID) | ORAL | 0 refills | Status: AC

## 2024-01-17 NOTE — Patient Instructions (Signed)

## 2024-01-17 NOTE — Progress Notes (Signed)
 Virtual Visit Consent   Casey Pope, you are scheduled for a virtual visit with a [facility] provider today. Just as with appointments in the office, your consent must be obtained to participate. Your consent will be active for this visit and any virtual visit you may have with one of our providers in the next 365 days. If you have a MyChart account, a copy of this consent can be sent to you electronically.  As this is a virtual visit, video technology does not allow for your provider to perform a traditional examination. This may limit your provider's ability to fully assess your condition. If your provider identifies any concerns that need to be evaluated in person or the need to arrange testing (such as labs, EKG, etc.), we will make arrangements to do so. Although advances in technology are sophisticated, we cannot ensure that it will always work on either your end or our end. If the connection with a video visit is poor, the visit may have to be switched to a telephone visit. With either a video or telephone visit, we are not always able to ensure that we have a secure connection.  By engaging in this virtual visit, you consent to the provision of healthcare and authorize for your insurance to be billed (if applicable) for the services provided during this visit. Depending on your insurance coverage, you may receive a charge related to this service.  I need to obtain your verbal consent now. Are you willing to proceed with your visit today? Jocelin Schuelke Paulena Servais has provided verbal consent on 01/17/2024 for a virtual visit (video or telephone). [name], [name]  Date: 01/17/2024 7:21 PM   Virtual Visit via Video Note   I, [name], connected with  Korbin Notaro Fenna Semel  ([id number], 11/09/1985) on 01/17/24 at  7:15 PM EDT by a video-enabled telemedicine application and verified that I am speaking with the correct person using two  identifiers.  Location: Patient: Virtual Visit Location Patient: Home Provider: Virtual Visit Location Provider: Home Office   I discussed the limitations of evaluation and management by telemedicine and the availability of in person appointments. The patient expressed understanding and agreed to proceed.    History of Present Illness: Casey Pope is a 38 y.o. who identifies as a female who was assigned female at birth, and is being seen today for head congestion, sinus pressure, no color to mucus, on allergy meds, no fever. Slight cough. In no distress.   HPI: HPI  Problems:  Patient Active Problem List   Diagnosis Date Noted   Loud snoring 03/27/2023   Teeth grinding 03/27/2023   Witnessed episode of apnea 03/27/2023   Morbid obesity (HCC) 09/20/2022   Mood disorder 09/20/2022    Allergies:  Allergies  Allergen Reactions   Ciprofloxacin     Sulfa Antibiotics Hives   Clindamycin/Lincomycin Other (See Comments)    C-Diff   Amoxicillin Rash   Nickel Rash   Medications:  Current Outpatient Medications:    BIOTIN PO, Take by mouth., Disp: , Rfl:    buPROPion  (WELLBUTRIN  SR) 200 MG 12 hr tablet, Take 1 tablet (200 mg total) by mouth 2 (two) times daily., Disp: 180 tablet, Rfl: 3   cholecalciferol (VITAMIN D3) 25 MCG (1000 UNIT) tablet, Take 3,000 Units by mouth daily., Disp: , Rfl:    Levonorgestrel-Ethinyl Estradiol (SIMPESSE) 0.15-0.03 &0.01 MG tablet, Take 1 tablet by mouth daily., Disp: 91 tablet, Rfl: 4   ondansetron  (ZOFRAN -ODT) 4  MG disintegrating tablet, Take 1 tablet (4 mg total) by mouth every 8 (eight) hours as needed., Disp: 20 tablet, Rfl: 0   Semaglutide -Weight Management 1 MG/0.5ML SOAJ, Inject 1 mg into the skin once a week., Disp: 6 mL, Rfl: 0   traZODone  (DESYREL ) 50 MG tablet, Take 25 mg by mouth at bedtime as needed., Disp: , Rfl:   Observations/Objective: Patient is well-developed, well-nourished in no acute distress.  Resting comfortably   at home.  Head is normocephalic, atraumatic.  No labored breathing.  Speech is clear and coherent with logical content.  Patient is alert and oriented at baseline.    Assessment and Plan: 1. Allergic rhinitis due to pollen, unspecified seasonality (Primary)  Increase fluids, humidifier at night, UC as needed.   Follow Up Instructions: I discussed the assessment and treatment plan with the patient. The patient was provided an opportunity to ask questions and all were answered. The patient agreed with the plan and demonstrated an understanding of the instructions.  A copy of instructions were sent to the patient via MyChart unless otherwise noted below.     The patient was advised to call back or seek an in-person evaluation if the symptoms worsen or if the condition fails to improve as anticipated.    Shadrack Brummitt, [name]

## 2024-01-31 ENCOUNTER — Encounter: Admitting: Nurse Practitioner

## 2024-02-08 ENCOUNTER — Encounter: Payer: Self-pay | Admitting: Nurse Practitioner

## 2024-02-08 ENCOUNTER — Ambulatory Visit (INDEPENDENT_AMBULATORY_CARE_PROVIDER_SITE_OTHER): Admitting: Nurse Practitioner

## 2024-02-08 VITALS — BP 100/66 | HR 82 | Temp 98.0°F | Ht 64.6 in | Wt 213.0 lb

## 2024-02-08 DIAGNOSIS — Z0000 Encounter for general adult medical examination without abnormal findings: Secondary | ICD-10-CM | POA: Diagnosis not present

## 2024-02-08 DIAGNOSIS — G43909 Migraine, unspecified, not intractable, without status migrainosus: Secondary | ICD-10-CM | POA: Insufficient documentation

## 2024-02-08 DIAGNOSIS — G43019 Migraine without aura, intractable, without status migrainosus: Secondary | ICD-10-CM | POA: Diagnosis not present

## 2024-02-08 DIAGNOSIS — Z136 Encounter for screening for cardiovascular disorders: Secondary | ICD-10-CM

## 2024-02-08 DIAGNOSIS — F39 Unspecified mood [affective] disorder: Secondary | ICD-10-CM

## 2024-02-08 MED ORDER — SUMATRIPTAN SUCCINATE 100 MG PO TABS: 100.0000 mg | ORAL_TABLET | ORAL | 2 refills | Status: AC | PRN

## 2024-02-08 NOTE — Progress Notes (Signed)
 BP 100/66   Pulse 82   Temp 98 F (36.7 C) (Oral)   Ht 5' 4.6 (1.641 m)   Wt 213 lb (96.6 kg)   LMP  (LMP Unknown)   SpO2 97%   BMI 35.89 kg/m    Subjective:    Patient ID: Casey Pope, female    DOB: [birth year], 38 y.o.   MRN: [id number]  HPI: Casey Pope is a 38 y.o. female presenting on 02/08/2024 for comprehensive medical examination. Current medical complaints include:none  She currently lives with: Menopausal Symptoms: no  MOOD Patient states she feels like the wellbutrin  is working well for her.  She feels like this is a good dose for her.  Denies concerns at visit today.  Denies SI.   Depression Screen done today and results listed below:     02/08/2024   10:26 AM 09/28/2023    8:13 AM 06/29/2023    9:42 AM 04/28/2023    8:23 AM 03/22/2023    2:27 PM  Depression screen PHQ 2/9  Decreased Interest 0 0 2 1 0  Down, Depressed, Hopeless 0 0 0 1 0  PHQ - 2 Score 0 0 2 2 0  Altered sleeping 0 1 2 2 2   Tired, decreased energy 1 1 1 1 2   Change in appetite 1 1 0 1 3  Feeling bad or failure about yourself  0 0 0 0 0  Trouble concentrating 0 0 0 0 0  Moving slowly or fidgety/restless 0 0 0 0 0  Suicidal thoughts 0 0 0 0 0  PHQ-9 Score 2 3  5  6  7    Difficult doing work/chores Not difficult at all Not difficult at all Not difficult at all Somewhat difficult Somewhat difficult     Data saved with a previous flowsheet row definition    The patient does not have a history of falls. I did complete a risk assessment for falls. A plan of care for falls was documented.   Past Medical History:  Past Medical History:  Diagnosis Date   Allergy    Anxiety 2015   Asthma    as a child - rarely uses inhaler   C. difficile colitis 12/01/2016   Colitis 11/29/2016   Depression 2015   Disease of appendix    Lower back pain    ? herniated disc   Plantar fasciitis    Rectal bleeding 03/11/2018    Surgical History:  Past Surgical History:   Procedure Laterality Date   CESAREAN SECTION  2008   CESAREAN SECTION WITH BILATERAL TUBAL LIGATION Bilateral 02/27/2014   Procedure: CESAREAN SECTION WITH BILATERAL TUBAL LIGATION;  Surgeon: Duwaine Blumenthal, DO;  Location: [facility];  Service: Obstetrics;  Laterality: Bilateral;  Repeat  edc 03/06/14   CHOLECYSTECTOMY     TUBAL LIGATION     WISDOM TOOTH EXTRACTION      Medications:  Current Outpatient Medications on File Prior to Visit  Medication Sig   buPROPion  (WELLBUTRIN  SR) 200 MG 12 hr tablet Take 1 tablet (200 mg total) by mouth 2 (two) times daily.   Levonorgestrel-Ethinyl Estradiol (SIMPESSE) 0.15-0.03 &0.01 MG tablet Take 1 tablet by mouth daily.   semaglutide -weight management (WEGOVY ) 1.7 MG/0.75ML SOAJ SQ injection Inject 1.7 mg into the skin once a week.   traZODone  (DESYREL ) 50 MG tablet Take 25 mg by mouth at bedtime as needed.   No current facility-administered medications on file prior to visit.    Allergies:  Allergies  Allergen Reactions   Ciprofloxacin     Sulfa Antibiotics Hives   Clindamycin/Lincomycin Other (See Comments)    C-Diff   Amoxicillin Rash   Nickel Rash    Social History:  Social History   Socioeconomic History   Marital status: Married    Spouse name: Darold   Number of children: 3   Years of education: Not on file   Highest education level: Master's degree (e.g., MA, MS, MEng, MEd, MSW, MBA)  Occupational History   Not on file  Tobacco Use   Smoking status: Never   Smokeless tobacco: Never  Vaping Use   Vaping status: Not on file  Substance and Sexual Activity   Alcohol use: Yes    Alcohol/week: 1.0 standard drink of alcohol    Types: 1 Glasses of wine per week    Comment: socially   Drug use: No   Sexual activity: Yes    Birth control/protection: Surgical  Other Topics Concern   Not on file  Social History Narrative   Married.   Has 3 three children.   Completed Masters degree, works as a runner, broadcasting/film/video.   Enjoys spending time  with her family.   Social Drivers of Corporate Investment Banker Strain: Low Risk  (03/22/2023)   Overall Financial Resource Strain (CARDIA)    Difficulty of Paying Living Expenses: Not very hard  Food Insecurity: No Food Insecurity (03/22/2023)   Hunger Vital Sign    Worried About Running Out of Food in the Last Year: Never true    Ran Out of Food in the Last Year: Never true  Transportation Needs: No Transportation Needs (03/22/2023)   PRAPARE - Administrator, Civil Service (Medical): No    Lack of Transportation (Non-Medical): No  Physical Activity: Insufficiently Active (03/22/2023)   Exercise Vital Sign    Days of Exercise per Week: 2 days    Minutes of Exercise per Session: 30 min  Stress: Stress Concern Present (03/22/2023)   [employer] of Occupational Health - Occupational Stress Questionnaire    Feeling of Stress : Rather much  Social Connections: Moderately Integrated (03/22/2023)   Social Connection and Isolation Panel    Frequency of Communication with Friends and Family: More than three times a week    Frequency of Social Gatherings with Friends and Family: Once a week    Attends Religious Services: More than 4 times per year    Active Member of [employer] or Organizations: No    Attends Engineer, Structural: Not on file    Marital Status: Married  Catering Manager Violence: Not on file   Social History   Tobacco Use  Smoking Status Never  Smokeless Tobacco Never   Social History   Substance and Sexual Activity  Alcohol Use Yes   Alcohol/week: 1.0 standard drink of alcohol   Types: 1 Glasses of wine per week   Comment: socially    Family History:  Family History  Problem Relation Age of Onset   Hypertension Mother    Diabetes Mother    Depression Mother    Obesity Mother    Arthritis Father    ADD / ADHD Sister    ADD / ADHD Daughter    Anxiety disorder Daughter    Lung cancer Maternal Grandfather 38 - 29       smoker    Macular degeneration Paternal Grandmother    Vision loss Paternal Grandmother    Heart attack Paternal Grandfather 20 - 72  Inflammatory bowel disease Neg Hx     Past medical history, surgical history, medications, allergies, family history and social history reviewed with patient today and changes made to appropriate areas of the chart.   Review of Systems  Neurological:  Positive for headaches.  Psychiatric/Behavioral:  Positive for depression.    All other ROS negative except what is listed above and in the HPI.      Objective:    BP 100/66   Pulse 82   Temp 98 F (36.7 C) (Oral)   Ht 5' 4.6 (1.641 m)   Wt 213 lb (96.6 kg)   LMP  (LMP Unknown)   SpO2 97%   BMI 35.89 kg/m   Wt Readings from Last 3 Encounters:  02/08/24 213 lb (96.6 kg)  10/07/23 223 lb 3.2 oz (101.2 kg)  09/28/23 222 lb (100.7 kg)    Physical Exam Vitals and nursing note reviewed.  Constitutional:      General: She is awake. She is not in acute distress.    Appearance: Normal appearance. She is well-developed. She is obese. She is not ill-appearing.  HENT:     Head: Normocephalic and atraumatic.     Right Ear: Hearing, tympanic membrane, ear canal and external ear normal. No drainage.     Left Ear: Hearing, tympanic membrane, ear canal and external ear normal. No drainage.     Nose: Nose normal.     Right Sinus: No maxillary sinus tenderness or frontal sinus tenderness.     Left Sinus: No maxillary sinus tenderness or frontal sinus tenderness.     Mouth/Throat:     Mouth: Mucous membranes are moist.     Pharynx: Oropharynx is clear. Uvula midline. No pharyngeal swelling, oropharyngeal exudate or posterior oropharyngeal erythema.  Eyes:     General: Lids are normal.        Right eye: No discharge.        Left eye: No discharge.     Extraocular Movements: Extraocular movements intact.     Conjunctiva/sclera: Conjunctivae normal.     Pupils: Pupils are equal, round, and reactive to light.      Visual Fields: Right eye visual fields normal and left eye visual fields normal.  Neck:     Thyroid : No thyromegaly.     Vascular: No carotid bruit.     Trachea: Trachea normal.  Cardiovascular:     Rate and Rhythm: Normal rate and regular rhythm.     Heart sounds: Normal heart sounds. No murmur heard.    No gallop.  Pulmonary:     Effort: Pulmonary effort is normal. No accessory muscle usage or respiratory distress.     Breath sounds: Normal breath sounds.  Chest:  Breasts:    Right: Normal.     Left: Normal.  Abdominal:     General: Bowel sounds are normal.     Palpations: Abdomen is soft. There is no hepatomegaly or splenomegaly.     Tenderness: There is no abdominal tenderness.  Musculoskeletal:        General: Normal range of motion.     Cervical back: Normal range of motion and neck supple.     Right lower leg: No edema.     Left lower leg: No edema.  Lymphadenopathy:     Head:     Right side of head: No submental, submandibular, tonsillar, preauricular or posterior auricular adenopathy.     Left side of head: No submental, submandibular, tonsillar, preauricular or posterior auricular adenopathy.  Cervical: No cervical adenopathy.     Upper Body:     Right upper body: No supraclavicular, axillary or pectoral adenopathy.     Left upper body: No supraclavicular, axillary or pectoral adenopathy.  Skin:    General: Skin is warm and dry.     Capillary Refill: Capillary refill takes less than 2 seconds.     Findings: No rash.  Neurological:     Mental Status: She is alert and oriented to person, place, and time.     Gait: Gait is intact.  Psychiatric:        Attention and Perception: Attention normal.        Mood and Affect: Mood normal.        Speech: Speech normal.        Behavior: Behavior normal. Behavior is cooperative.        Thought Content: Thought content normal.        Judgment: Judgment normal.     Results for orders placed or performed in visit on  10/07/23  Urine Culture   Collection Time: 10/07/23  1:19 PM   Specimen: Urine   UR  Result Value Ref Range   Urine Culture, Routine Final report (A)    Organism ID, Bacteria Escherichia coli (A)    Antimicrobial Susceptibility Comment   Microscopic Examination   Collection Time: 10/07/23  1:19 PM   Urine  Result Value Ref Range   WBC, UA 11-30 (A) 0 - 5 /hpf   RBC, Urine 3-10 (A) 0 - 2 /hpf   Epithelial Cells (non renal) 0-10 0 - 10 /hpf   Bacteria, UA None seen None seen/Few  Urinalysis, Routine w reflex microscopic   Collection Time: 10/07/23  1:19 PM  Result Value Ref Range   Specific Gravity, UA CANCELED    pH, UA CANCELED    Color, UA Orange Yellow   Appearance Ur Clear Clear   Protein,UA CANCELED    Glucose, UA CANCELED    Ketones, UA CANCELED    Microscopic Examination See below:       Assessment & Plan:   Problem List Items Addressed This Visit       Cardiovascular and Mediastinum   Migraines   Chronic.  Controlled.  Recently had her first migraine in several years.  Uses Imitrex  as needed for therapy.  Refilled at visit today.  Follow up in 6 months.  Call sooner if concerns arise.       Relevant Medications   SUMAtriptan  (IMITREX ) 100 MG tablet     Other   Morbid obesity (HCC)   Recommended eating smaller high protein, low fat meals more frequently and exercising 30 mins a day 5 times a week with a goal of 10-15lb weight loss in the next 3 months. Has lost 10lbs since last visit.       Relevant Medications   semaglutide -weight management (WEGOVY ) 1.7 MG/0.75ML SOAJ SQ injection   Mood disorder   Chronic.  Controlled.  Continue with current medication regimen of Wellbutrin  100mg  BID.  Labs ordered today.  Return to clinic in 6 months for reevaluation.  Call sooner if concerns arise.        Other Visit Diagnoses       Annual physical exam    -  Primary   Health maintenance reviewed during visit today.  Labs ordered.  Vaccines reviewed.  PAP up to  date.   Relevant Orders   CBC with Differential/Platelet   Comprehensive metabolic panel with GFR  Lipid panel   TSH     Screening for ischemic heart disease       Relevant Orders   Lipid panel        Follow up plan: Return in about 6 months (around 08/07/2024) for Depression/Anxiety FU.   LABORATORY TESTING:  - Pap smear: up to date  IMMUNIZATIONS:   - Tdap: Tetanus vaccination status reviewed: last tetanus booster within 10 years. - Influenza: Up to date - Pneumovax: Refused - Prevnar: Refused - COVID: Refused - HPV: Not applicable - Shingrix vaccine: Not applicable  SCREENING: -Mammogram: Not applicable  - Colonoscopy: Not applicable  - Bone Density: Not applicable  -Hearing Test: Not applicable  -Spirometry: Not applicable   PATIENT COUNSELING:   Advised to take 1 mg of folate supplement per day if capable of pregnancy.   Sexuality: Discussed sexually transmitted diseases, partner selection, use of condoms, avoidance of unintended pregnancy  and contraceptive alternatives.   Advised to avoid cigarette smoking.  I discussed with the patient that most people either abstain from alcohol or drink within safe limits (<=14/week and <=4 drinks/occasion for males, <=7/weeks and <= 3 drinks/occasion for females) and that the risk for alcohol disorders and other health effects rises proportionally with the number of drinks per week and how often a drinker exceeds daily limits.  Discussed cessation/primary prevention of drug use and availability of treatment for abuse.   Diet: Encouraged to adjust caloric intake to maintain  or achieve ideal body weight, to reduce intake of dietary saturated fat and total fat, to limit sodium intake by avoiding high sodium foods and not adding table salt, and to maintain adequate dietary potassium and calcium preferably from fresh fruits, vegetables, and low-fat dairy products.    stressed the importance of regular exercise  Injury  prevention: Discussed safety belts, safety helmets, smoke detector, smoking near bedding or upholstery.   Dental health: Discussed importance of regular tooth brushing, flossing, and dental visits.    NEXT PREVENTATIVE PHYSICAL DUE IN 1 YEAR. Return in about 6 months (around 08/07/2024) for Depression/Anxiety FU.

## 2024-02-08 NOTE — Assessment & Plan Note (Signed)
 Chronic.  Controlled.  Recently had her first migraine in several years.  Uses Imitrex  as needed for therapy.  Refilled at visit today.  Follow up in 6 months.  Call sooner if concerns arise.

## 2024-02-08 NOTE — Assessment & Plan Note (Signed)
 Recommended eating smaller high protein, low fat meals more frequently and exercising 30 mins a day 5 times a week with a goal of 10-15lb weight loss in the next 3 months. Has lost 10lbs since last visit.

## 2024-02-08 NOTE — Assessment & Plan Note (Signed)
 Chronic.  Controlled.  Continue with current medication regimen of Wellbutrin  100mg  BID.  Labs ordered today.  Return to clinic in 6 months for reevaluation.  Call sooner if concerns arise.

## 2024-02-09 ENCOUNTER — Ambulatory Visit: Payer: Self-pay | Admitting: Nurse Practitioner

## 2024-02-09 LAB — COMPREHENSIVE METABOLIC PANEL WITH GFR
ALT: 14 IU/L (ref 0–32)
AST: 13 IU/L (ref 0–40)
Albumin: 4.1 g/dL (ref 3.9–4.9)
Alkaline Phosphatase: 84 IU/L (ref 41–116)
BUN/Creatinine Ratio: 14 (ref 9–23)
BUN: 11 mg/dL (ref 6–20)
Bilirubin Total: 0.5 mg/dL (ref 0.0–1.2)
CO2: 23 mmol/L (ref 20–29)
Calcium: 9.1 mg/dL (ref 8.7–10.2)
Chloride: 103 mmol/L (ref 96–106)
Creatinine, Ser: 0.77 mg/dL (ref 0.57–1.00)
Globulin, Total: 2.4 g/dL (ref 1.5–4.5)
Glucose: 69 mg/dL — ABNORMAL LOW (ref 70–99)
Potassium: 4.4 mmol/L (ref 3.5–5.2)
Sodium: 140 mmol/L (ref 134–144)
Total Protein: 6.5 g/dL (ref 6.0–8.5)
eGFR: 101 mL/min/1.73 (ref >59)

## 2024-02-09 LAB — CBC WITH DIFFERENTIAL/PLATELET
Basophils Absolute: 0.1 x10E3/uL (ref 0.0–0.2)
Basos: 1 %
EOS (ABSOLUTE): 0.1 x10E3/uL (ref 0.0–0.4)
Eos: 3 %
Hematocrit: 43.7 % (ref 34.0–46.6)
Hemoglobin: 14 g/dL (ref 11.1–15.9)
Immature Grans (Abs): 0 x10E3/uL (ref 0.0–0.1)
Immature Granulocytes: 0 %
Lymphocytes Absolute: 2 x10E3/uL (ref 0.7–3.1)
Lymphs: 38 %
MCH: 30 pg (ref 26.6–33.0)
MCHC: 32 g/dL (ref 31.5–35.7)
MCV: 94 fL (ref 79–97)
Monocytes Absolute: 0.3 x10E3/uL (ref 0.1–0.9)
Monocytes: 5 %
Neutrophils Absolute: 2.8 x10E3/uL (ref 1.4–7.0)
Neutrophils: 53 %
Platelets: 277 x10E3/uL (ref 150–450)
RBC: 4.66 x10E6/uL (ref 3.77–5.28)
RDW: 12.5 % (ref 11.7–15.4)
WBC: 5.3 x10E3/uL (ref 3.4–10.8)

## 2024-02-09 LAB — LIPID PANEL
Chol/HDL Ratio: 2.4 ratio (ref 0.0–4.4)
Cholesterol, Total: 125 mg/dL (ref 100–199)
HDL: 53 mg/dL (ref >39)
LDL Chol Calc (NIH): 60 mg/dL (ref 0–99)
Triglycerides: 52 mg/dL (ref 0–149)
VLDL Cholesterol Cal: 12 mg/dL (ref 5–40)

## 2024-02-09 LAB — TSH: TSH: 1.37 u[IU]/mL (ref 0.450–4.500)

## 2024-03-24 ENCOUNTER — Ambulatory Visit: Payer: Self-pay

## 2024-03-24 NOTE — Telephone Encounter (Signed)
 FYI Only or Action Required?: FYI only for provider: appointment scheduled on 12/27.  Patient was last seen in primary care on 02/08/2024 by Casey Pao, NP.  Called Nurse Triage reporting Influenza.  Symptoms began yesterday.  Interventions attempted: OTC medications: tylenol , dayquil, xycam .  Symptoms are: gradually worsening.  Triage Disposition: Call PCP Within 24 Hours  Patient/caregiver understands and will follow disposition?: Yes  Copied from CRM #[id number]. Topic: Clinical - Medical Advice >> Mar 24, 2024 12:04 PM Casey Pope wrote: Reason for CRM: Patient is calling in because her daughters have been diagnosed with the flu and she is starting to feel sick. Patient says she has a runny nose and headache. She has been taking over the counter medication to help with her symptoms. Patient wants to know if she should be seen or continue taking medication over the counter. Reason for Disposition  [1] Patient is NOT HIGH RISK AND [2] strongly requests antiviral medicine AND [3] flu symptoms present < 48 hours  Answer Assessment - Initial Assessment Questions Nasal congestion, runny nose, headache, and fatigue. Daughters both have tested positive for FLU they think A. Wanting to ensure they are in the window for Tamiflu .  Dayquil, nettipot, tylenol ,. Zycam  Virtual UC appt made for Presumed Flu. ED precautions understood   1. TYPE of EXPOSURE: How were you exposed? (e.g., close contact, not a close contact)     Daughters tested positive for Flu A 2. DATE of EXPOSURE: When did the exposure occur? (e.g., hour, days, weeks)     Daughters tested positive today  3. SYMPTOMS: Do you have any symptoms? (e.g., cough, fever, sore throat, difficulty breathing).     Congestion and headache  4. HIGH RISK for COMPLICATIONS: Do you have any heart or lung problems? Do you have a weakened immune system? (e.g., CHF, COPD, asthma, HIV positive, chemotherapy, renal failure, diabetes  mellitus, sickle cell anemia)     Denies  5. PREGNANCY: Is there any chance you are pregnant? When was your last menstrual period?     Denies  Answer Assessment - Initial Assessment Questions 1. SYMPTOMS: What is your main symptom or concern? (e.g., cough, fever, shortness of breath, muscle aches)     Nasal congestion, headache, fatigue, runny nose 2. ONSET: When did the symptoms start?      yesterday 3. COUGH: Do you have a cough? If Yes, ask: How bad is the cough?       mild 4. FEVER: Do you have a fever? If Yes, ask: What is your temperature, how was it measured, and when did it start?     denies 5. BREATHING DIFFICULTY: Are you having any difficulty breathing? (e.g., normal; shortness of breath, wheezing, unable to speak)      denies 6. BETTER-SAME-WORSE: Are you getting better, staying the same or getting worse compared to yesterday?  If getting worse, ask, In what way?     worse 7. OTHER SYMPTOMS: Do you have any other symptoms?  (e.g., chills, fatigue, headache, loss of smell or taste, muscle pain, sore throat)     Headache, fatigue, congestion, runny nose 8. INFLUENZA EXPOSURE: Was there any known exposure to influenza (flu) before the symptoms began?      2 kids both tested positive for FluA 9. INFLUENZA SUSPECTED: Why do you think you have influenza? (e.g., positive flu self-test at home, symptoms after exposure).     Yes- daughters in home both have FLU 10. INFLUENZA VACCINE: Have you had the flu vaccine?  If Yes, ask: When did you last get it?       Yes-- 12/21/23 11. HIGH RISK FOR COMPLICATIONS: Do you have any chronic medical problems? (e.g., asthma, heart or lung disease, obesity, weak immune system)       denies 12. PREGNANCY: Is there any chance you are pregnant? When was your last menstrual period?       denies 85. O2 SATURATION MONITOR:  Do you use an oxygen saturation monitor (pulse oximeter) at home? If Yes, ask What is your  reading (oxygen level) today? What is your usual oxygen saturation reading? (e.g., 95%)       Can't check  Protocols used: Influenza (Flu) Exposure-A-AH, Influenza (Flu) Suspected-A-AH

## 2024-03-25 ENCOUNTER — Telehealth: Admitting: Family Medicine

## 2024-03-25 DIAGNOSIS — J4541 Moderate persistent asthma with (acute) exacerbation: Secondary | ICD-10-CM | POA: Diagnosis not present

## 2024-03-25 DIAGNOSIS — J111 Influenza due to unidentified influenza virus with other respiratory manifestations: Secondary | ICD-10-CM | POA: Diagnosis not present

## 2024-03-25 MED ORDER — PROMETHAZINE-DM 6.25-15 MG/5ML PO SYRP: 5.0000 mL | ORAL_SOLUTION | Freq: Four times a day (QID) | ORAL | 0 refills | Status: AC | PRN

## 2024-03-25 MED ORDER — OSELTAMIVIR PHOSPHATE 75 MG PO CAPS: 75.0000 mg | ORAL_CAPSULE | Freq: Two times a day (BID) | ORAL | 0 refills | Status: AC

## 2024-03-25 MED ORDER — PREDNISONE 20 MG PO TABS: 20.0000 mg | ORAL_TABLET | Freq: Two times a day (BID) | ORAL | 0 refills | Status: AC

## 2024-03-25 NOTE — Patient Instructions (Signed)

## 2024-03-25 NOTE — Progress Notes (Signed)
 " Virtual Visit Consent   Casey Pope, you are scheduled for a virtual visit with a [facility] provider today. Just as with appointments in the office, your consent must be obtained to participate. Your consent will be active for this visit and any virtual visit you may have with one of our providers in the next 365 days. If you have a MyChart account, a copy of this consent can be sent to you electronically.  As this is a virtual visit, video technology does not allow for your provider to perform a traditional examination. This may limit your provider's ability to fully assess your condition. If your provider identifies any concerns that need to be evaluated in person or the need to arrange testing (such as labs, EKG, etc.), we will make arrangements to do so. Although advances in technology are sophisticated, we cannot ensure that it will always work on either your end or our end. If the connection with a video visit is poor, the visit may have to be switched to a telephone visit. With either a video or telephone visit, we are not always able to ensure that we have a secure connection.  By engaging in this virtual visit, you consent to the provision of healthcare and authorize for your insurance to be billed (if applicable) for the services provided during this visit. Depending on your insurance coverage, you may receive a charge related to this service.  I need to obtain your verbal consent now. Are you willing to proceed with your visit today? Casey Pope has provided verbal consent on 03/25/2024 for a virtual visit (video or telephone). Casey Lamp, [name]  Date: 03/25/2024 3:20 PM   Virtual Visit via Video Note   I, Casey Pope, connected with  Casey Pope  ([id number], 07/03/1985) on 03/25/2024 at  3:15 PM EST by a video-enabled telemedicine application and verified that I am speaking with the correct person using two  identifiers.  Location: Patient: Virtual Visit Location Patient: Home Provider: Virtual Visit Location Provider: Home Office   I discussed the limitations of evaluation and management by telemedicine and the availability of in person appointments. The patient expressed understanding and agreed to proceed.    History of Present Illness: Casey Pope is a 38 y.o. who identifies as a female who was assigned female at birth, and is being seen today for flu sx starting yesterday, runny nose, headache, weak, nausea, chills, body aches, fever, nausea, no vomiting or diarrhea. Cough.Wheezing and no sob. History of asthma. Both daughters have the flu. She has an albuterol  inhaler. Casey Pope  HPI: HPI  Problems:  Patient Active Problem List   Diagnosis Date Noted   Migraines 02/08/2024   Loud snoring 03/27/2023   Teeth grinding 03/27/2023   Witnessed episode of apnea 03/27/2023   Morbid obesity (HCC) 09/20/2022   Mood disorder 09/20/2022    Allergies: Allergies[1] Medications: Current Medications[2]  Observations/Objective: Patient is well-developed, well-nourished in no acute distress.  Resting comfortably  at home.  Head is normocephalic, atraumatic.  No labored breathing.  Speech is clear and coherent with logical content.  Patient is alert and oriented at baseline.    Assessment and Plan: 1. Influenza (Primary)  Increase fluids, humidifier, tylenol  or ibuprofen , rest, UC as needed.   Follow Up Instructions: I discussed the assessment and treatment plan with the patient. The patient was provided an opportunity to ask questions and all were answered. The patient agreed with the plan and  demonstrated an understanding of the instructions.  A copy of instructions were sent to the patient via MyChart unless otherwise noted below.     The patient was advised to call back or seek an in-person evaluation if the symptoms worsen or if the condition fails to improve as  anticipated.    Casey Borre, [name]      [1]  Allergies Allergen Reactions   Ciprofloxacin     Sulfa Antibiotics Hives   Clindamycin/Lincomycin Other (See Comments)    C-Diff   Amoxicillin Rash   Nickel Rash  [2]  Current Outpatient Medications:    oseltamivir  (TAMIFLU ) 75 MG capsule, Take 1 capsule (75 mg total) by mouth 2 (two) times daily for 5 days., Disp: 10 capsule, Rfl: 0   predniSONE  (DELTASONE ) 20 MG tablet, Take 1 tablet (20 mg total) by mouth 2 (two) times daily with a meal for 5 days., Disp: 10 tablet, Rfl: 0   promethazine -dextromethorphan (PROMETHAZINE -DM) 6.25-15 MG/5ML syrup, Take 5 mLs by mouth 4 (four) times daily as needed for up to 10 days for cough., Disp: 118 mL, Rfl: 0   buPROPion  (WELLBUTRIN  SR) 200 MG 12 hr tablet, Take 1 tablet (200 mg total) by mouth 2 (two) times daily., Disp: 180 tablet, Rfl: 3   Levonorgestrel-Ethinyl Estradiol (SIMPESSE) 0.15-0.03 &0.01 MG tablet, Take 1 tablet by mouth daily., Disp: 91 tablet, Rfl: 4   semaglutide -weight management (WEGOVY ) 1.7 MG/0.75ML SOAJ SQ injection, Inject 1.7 mg into the skin once a week., Disp: , Rfl:    SUMAtriptan  (IMITREX ) 100 MG tablet, Take 1 tablet (100 mg total) by mouth every 2 (two) hours as needed for migraine. May repeat in 2 hours if headache persists or recurs., Disp: 10 tablet, Rfl: 2   traZODone  (DESYREL ) 50 MG tablet, Take 25 mg by mouth at bedtime as needed., Disp: , Rfl:   "

## 2024-04-19 ENCOUNTER — Encounter: Payer: Self-pay | Admitting: Nurse Practitioner

## 2024-04-19 ENCOUNTER — Ambulatory Visit: Admitting: Nurse Practitioner

## 2024-04-19 VITALS — BP 110/74 | HR 91 | Temp 98.2°F | Ht 64.61 in | Wt 207.4 lb

## 2024-04-19 DIAGNOSIS — N3000 Acute cystitis without hematuria: Secondary | ICD-10-CM

## 2024-04-19 DIAGNOSIS — F411 Generalized anxiety disorder: Secondary | ICD-10-CM | POA: Diagnosis not present

## 2024-04-19 DIAGNOSIS — R300 Dysuria: Secondary | ICD-10-CM

## 2024-04-19 LAB — MICROSCOPIC EXAMINATION: Bacteria, UA: NONE SEEN

## 2024-04-19 LAB — WET PREP FOR TRICH, YEAST, CLUE
Clue Cell Exam: NEGATIVE
Trichomonas Exam: NEGATIVE
Yeast Exam: NEGATIVE

## 2024-04-19 LAB — URINALYSIS, ROUTINE W REFLEX MICROSCOPIC

## 2024-04-19 MED ORDER — BUPROPION HCL ER (SR) 200 MG PO TB12: 200.0000 mg | ORAL_TABLET | Freq: Two times a day (BID) | ORAL | 1 refills | Status: AC

## 2024-04-19 MED ORDER — TRAZODONE HCL 50 MG PO TABS: 25.0000 mg | ORAL_TABLET | Freq: Every evening | ORAL | 1 refills | Status: AC | PRN

## 2024-04-19 MED ORDER — CEFTRIAXONE SODIUM 1 G IJ SOLR
1.0000 g | Freq: Once | INTRAMUSCULAR | Status: AC
  Administered 2024-04-19: 1 g via INTRAMUSCULAR

## 2024-04-19 NOTE — Progress Notes (Signed)
 "  BP 110/74 (BP Location: Right Arm, Patient Position: Sitting, Cuff Size: Normal)   Pulse 91   Temp 98.2 F (36.8 C) (Oral)   Ht 5' 4.61 (1.641 m)   Wt 207 lb 6.4 oz (94.1 kg)   SpO2 98%   BMI 34.94 kg/m    Subjective:    Patient ID: Casey Pope, female    DOB: February 25, 1986, 39 y.o.   MRN: [id number]  HPI: Casey Pope is a 38 y.o. female  Chief Complaint  Patient presents with   Urinary Tract Infection    Patient stated she has lower back pain, urgency to urinate but nothing is coming out, she is having a hard time sleeping and she did take AZO for the UTI. It soothed it a little but not much. This has been going on for 3 days   URINARY SYMPTOMS Symptoms started 3 days ago Dysuria: burning Urinary frequency: yes Urgency: yes Small volume voids: yes Symptom severity: no Urinary incontinence: no Foul odor: yes Hematuria: no Abdominal pain: yes Back pain: yes Suprapubic pain/pressure: yes Flank pain: no Fever:  no Vomiting: no Relief with cranberry juice: no Relief with pyridium: yes Status: stable Previous urinary tract infection: yes Recurrent urinary tract infection: no Sexual activity: No sexually active/monogomous/practicing safe sex History of sexually transmitted disease: no Penile discharge: no Treatments attempted: pyridium and increasing fluids   Relevant past medical, surgical, family and social history reviewed and updated as indicated. Interim medical history since our last visit reviewed. Allergies and medications reviewed and updated.  Review of Systems  Constitutional:  Negative for fever.  Gastrointestinal:  Negative for abdominal pain and vomiting.  Genitourinary:  Positive for decreased urine volume, dysuria, frequency and urgency. Negative for flank pain and hematuria.  Musculoskeletal:  Negative for back pain.    Per HPI unless specifically indicated above     Objective:    BP 110/74 (BP Location: Right  Arm, Patient Position: Sitting, Cuff Size: Normal)   Pulse 91   Temp 98.2 F (36.8 C) (Oral)   Ht 5' 4.61 (1.641 m)   Wt 207 lb 6.4 oz (94.1 kg)   SpO2 98%   BMI 34.94 kg/m   Wt Readings from Last 3 Encounters:  04/19/24 207 lb 6.4 oz (94.1 kg)  02/08/24 213 lb (96.6 kg)  10/07/23 223 lb 3.2 oz (101.2 kg)    Physical Exam Vitals and nursing note reviewed.  Constitutional:      General: She is not in acute distress.    Appearance: Normal appearance. She is not ill-appearing, toxic-appearing or diaphoretic.  HENT:     Head: Normocephalic.     Right Ear: External ear normal.     Left Ear: External ear normal.     Nose: Nose normal.     Mouth/Throat:     Mouth: Mucous membranes are moist.     Pharynx: Oropharynx is clear.  Eyes:     General:        Right eye: No discharge.        Left eye: No discharge.     Extraocular Movements: Extraocular movements intact.     Conjunctiva/sclera: Conjunctivae normal.     Pupils: Pupils are equal, round, and reactive to light.  Cardiovascular:     Rate and Rhythm: Normal rate and regular rhythm.     Heart sounds: No murmur heard. Pulmonary:     Effort: Pulmonary effort is normal. No respiratory distress.     Breath  sounds: Normal breath sounds. No wheezing or rales.  Abdominal:     General: Abdomen is flat. Bowel sounds are normal. There is no distension.     Palpations: Abdomen is soft. There is no mass.     Tenderness: There is no abdominal tenderness. There is no right CVA tenderness, left CVA tenderness, guarding or rebound.     Hernia: No hernia is present.  Musculoskeletal:     Cervical back: Normal range of motion and neck supple.  Skin:    General: Skin is warm and dry.     Capillary Refill: Capillary refill takes less than 2 seconds.  Neurological:     General: No focal deficit present.     Mental Status: She is alert and oriented to person, place, and time. Mental status is at baseline.  Psychiatric:        Mood and  Affect: Mood normal.        Behavior: Behavior normal.        Thought Content: Thought content normal.        Judgment: Judgment normal.     Results for orders placed or performed in visit on 02/08/24  CBC with Differential/Platelet   Collection Time: 02/08/24 10:44 AM  Result Value Ref Range   WBC 5.3 3.4 - 10.8 x10E3/uL   RBC 4.66 3.77 - 5.28 x10E6/uL   Hemoglobin 14.0 11.1 - 15.9 g/dL   Hematocrit 56.2 65.9 - 46.6 %   MCV 94 79 - 97 fL   MCH 30.0 26.6 - 33.0 pg   MCHC 32.0 31.5 - 35.7 g/dL   RDW 87.4 88.2 - 84.5 %   Platelets 277 150 - 450 x10E3/uL   Neutrophils 53 Not Estab. %   Lymphs 38 Not Estab. %   Monocytes 5 Not Estab. %   Eos 3 Not Estab. %   Basos 1 Not Estab. %   Neutrophils Absolute 2.8 1.4 - 7.0 x10E3/uL   Lymphocytes Absolute 2.0 0.7 - 3.1 x10E3/uL   Monocytes Absolute 0.3 0.1 - 0.9 x10E3/uL   EOS (ABSOLUTE) 0.1 0.0 - 0.4 x10E3/uL   Basophils Absolute 0.1 0.0 - 0.2 x10E3/uL   Immature Granulocytes 0 Not Estab. %   Immature Grans (Abs) 0.0 0.0 - 0.1 x10E3/uL  Comprehensive metabolic panel with GFR   Collection Time: 02/08/24 10:44 AM  Result Value Ref Range   Glucose 69 (L) 70 - 99 mg/dL   BUN 11 6 - 20 mg/dL   Creatinine, Ser 9.22 0.57 - 1.00 mg/dL   eGFR 898 >40 fO/fpw/8.26   BUN/Creatinine Ratio 14 9 - 23   Sodium 140 134 - 144 mmol/L   Potassium 4.4 3.5 - 5.2 mmol/L   Chloride 103 96 - 106 mmol/L   CO2 23 20 - 29 mmol/L   Calcium 9.1 8.7 - 10.2 mg/dL   Total Protein 6.5 6.0 - 8.5 g/dL   Albumin 4.1 3.9 - 4.9 g/dL   Globulin, Total 2.4 1.5 - 4.5 g/dL   Bilirubin Total 0.5 0.0 - 1.2 mg/dL   Alkaline Phosphatase 84 41 - 116 IU/L   AST 13 0 - 40 IU/L   ALT 14 0 - 32 IU/L  Lipid panel   Collection Time: 02/08/24 10:44 AM  Result Value Ref Range   Cholesterol, Total 125 100 - 199 mg/dL   Triglycerides 52 0 - 149 mg/dL   HDL 53 >60 mg/dL   VLDL Cholesterol Cal 12 5 - 40 mg/dL   LDL Chol Calc (NIH) 60  0 - 99 mg/dL   Chol/HDL Ratio 2.4 0.0 - 4.4  ratio  TSH   Collection Time: 02/08/24 10:44 AM  Result Value Ref Range   TSH 1.370 0.450 - 4.500 uIU/mL      Assessment & Plan:   Problem List Items Addressed This Visit   None Visit Diagnoses       Generalized anxiety disorder            Follow up plan: No follow-ups on file.      "

## 2024-04-19 NOTE — Addendum Note (Signed)
 Addended by: MELVIN PAO on: 04/19/2024 01:49 PM   Modules accepted: Orders

## 2024-04-25 ENCOUNTER — Ambulatory Visit: Payer: Self-pay | Admitting: Nurse Practitioner

## 2024-04-25 LAB — URINE CULTURE

## 2024-08-08 ENCOUNTER — Ambulatory Visit: Admitting: Nurse Practitioner
